# Patient Record
Sex: Female | Born: 1975 | Race: White | Hispanic: No | Smoking: Current every day smoker
Health system: Southern US, Community
[De-identification: ages and names within clinical notes are randomized; demographics above are authoritative.]

## PROBLEM LIST (undated history)

## (undated) ENCOUNTER — Inpatient Hospital Stay (HOSPITAL_COMMUNITY): Payer: Medicaid Other

## (undated) ENCOUNTER — Inpatient Hospital Stay (HOSPITAL_COMMUNITY): Payer: Self-pay

## (undated) DIAGNOSIS — D649 Anemia, unspecified: Secondary | ICD-10-CM

## (undated) DIAGNOSIS — R011 Cardiac murmur, unspecified: Secondary | ICD-10-CM

## (undated) DIAGNOSIS — IMO0002 Reserved for concepts with insufficient information to code with codable children: Secondary | ICD-10-CM

## (undated) DIAGNOSIS — E559 Vitamin D deficiency, unspecified: Secondary | ICD-10-CM

## (undated) DIAGNOSIS — O139 Gestational [pregnancy-induced] hypertension without significant proteinuria, unspecified trimester: Secondary | ICD-10-CM

## (undated) DIAGNOSIS — A749 Chlamydial infection, unspecified: Secondary | ICD-10-CM

## (undated) DIAGNOSIS — F329 Major depressive disorder, single episode, unspecified: Secondary | ICD-10-CM

## (undated) DIAGNOSIS — R87629 Unspecified abnormal cytological findings in specimens from vagina: Secondary | ICD-10-CM

## (undated) DIAGNOSIS — C801 Malignant (primary) neoplasm, unspecified: Secondary | ICD-10-CM

## (undated) DIAGNOSIS — T07XXXA Unspecified multiple injuries, initial encounter: Secondary | ICD-10-CM

## (undated) DIAGNOSIS — R87619 Unspecified abnormal cytological findings in specimens from cervix uteri: Secondary | ICD-10-CM

## (undated) DIAGNOSIS — E669 Obesity, unspecified: Secondary | ICD-10-CM

## (undated) DIAGNOSIS — R51 Headache: Secondary | ICD-10-CM

## (undated) DIAGNOSIS — O09899 Supervision of other high risk pregnancies, unspecified trimester: Secondary | ICD-10-CM

## (undated) DIAGNOSIS — F419 Anxiety disorder, unspecified: Secondary | ICD-10-CM

## (undated) DIAGNOSIS — B977 Papillomavirus as the cause of diseases classified elsewhere: Secondary | ICD-10-CM

## (undated) DIAGNOSIS — O24419 Gestational diabetes mellitus in pregnancy, unspecified control: Secondary | ICD-10-CM

## (undated) DIAGNOSIS — R87810 Cervical high risk human papillomavirus (HPV) DNA test positive: Secondary | ICD-10-CM

## (undated) DIAGNOSIS — Z8619 Personal history of other infectious and parasitic diseases: Secondary | ICD-10-CM

## (undated) DIAGNOSIS — B379 Candidiasis, unspecified: Secondary | ICD-10-CM

## (undated) DIAGNOSIS — F32A Depression, unspecified: Secondary | ICD-10-CM

## (undated) HISTORY — DX: Anemia, unspecified: D64.9

## (undated) HISTORY — DX: Cardiac murmur, unspecified: R01.1

## (undated) HISTORY — DX: Cervical high risk human papillomavirus (HPV) DNA test positive: R87.810

## (undated) HISTORY — DX: Headache: R51

## (undated) HISTORY — PX: LEEP: SHX91

## (undated) HISTORY — DX: Depression, unspecified: F32.A

## (undated) HISTORY — DX: Papillomavirus as the cause of diseases classified elsewhere: B97.7

## (undated) HISTORY — DX: Personal history of other infectious and parasitic diseases: Z86.19

## (undated) HISTORY — DX: Anxiety disorder, unspecified: F41.9

## (undated) HISTORY — DX: Candidiasis, unspecified: B37.9

## (undated) HISTORY — DX: Reserved for concepts with insufficient information to code with codable children: IMO0002

## (undated) HISTORY — PX: COLPOSCOPY: SHX161

## (undated) HISTORY — DX: Gestational diabetes mellitus in pregnancy, unspecified control: O24.419

## (undated) HISTORY — DX: Major depressive disorder, single episode, unspecified: F32.9

## (undated) HISTORY — DX: Malignant (primary) neoplasm, unspecified: C80.1

## (undated) HISTORY — DX: Unspecified multiple injuries, initial encounter: T07.XXXA

## (undated) HISTORY — PX: WISDOM TOOTH EXTRACTION: SHX21

## (undated) HISTORY — DX: Gestational (pregnancy-induced) hypertension without significant proteinuria, unspecified trimester: O13.9

## (undated) HISTORY — DX: Unspecified abnormal cytological findings in specimens from cervix uteri: R87.619

## (undated) HISTORY — DX: Unspecified abnormal cytological findings in specimens from vagina: R87.629

## (undated) HISTORY — DX: Chlamydial infection, unspecified: A74.9

## (undated) HISTORY — DX: Supervision of other high risk pregnancies, unspecified trimester: O09.899

---
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
## 2004-04-30 DIAGNOSIS — C801 Malignant (primary) neoplasm, unspecified: Secondary | ICD-10-CM

## 2004-04-30 HISTORY — DX: Malignant (primary) neoplasm, unspecified: C80.1

## 2005-08-13 ENCOUNTER — Emergency Department (HOSPITAL_COMMUNITY): Admission: EM | Admit: 2005-08-13 | Discharge: 2005-08-13 | Payer: Self-pay | Admitting: Emergency Medicine

## 2006-04-09 ENCOUNTER — Ambulatory Visit: Payer: Self-pay | Admitting: Ophthalmology

## 2006-09-16 ENCOUNTER — Ambulatory Visit: Payer: Self-pay | Admitting: Unknown Physician Specialty

## 2006-10-03 ENCOUNTER — Ambulatory Visit: Payer: Self-pay | Admitting: Unknown Physician Specialty

## 2008-08-01 ENCOUNTER — Inpatient Hospital Stay (HOSPITAL_COMMUNITY): Admission: AD | Admit: 2008-08-01 | Discharge: 2008-08-01 | Payer: Self-pay | Admitting: Obstetrics & Gynecology

## 2008-08-08 ENCOUNTER — Inpatient Hospital Stay (HOSPITAL_COMMUNITY): Admission: AD | Admit: 2008-08-08 | Discharge: 2008-08-10 | Payer: Self-pay | Admitting: Obstetrics and Gynecology

## 2010-07-22 ENCOUNTER — Inpatient Hospital Stay (HOSPITAL_COMMUNITY)
Admission: AD | Admit: 2010-07-22 | Discharge: 2010-07-24 | DRG: 775 | Disposition: A | Discharge status: Home / Self Care | Payer: Medicaid Other | Source: Ambulatory Visit | Attending: Obstetrics and Gynecology | Admitting: Obstetrics and Gynecology

## 2010-07-22 LAB — CBC
HCT: 36.3 % (ref 36.0–46.0)
Hemoglobin: 12.4 g/dL (ref 12.0–15.0)
MCH: 30.3 pg (ref 26.0–34.0)
MCHC: 34.2 g/dL (ref 30.0–36.0)
MCV: 88.8 fL (ref 78.0–100.0)
Platelets: 140 10*3/uL — ABNORMAL LOW (ref 150–400)
RBC: 4.09 MIL/uL (ref 3.87–5.11)
RDW: 13.6 % (ref 11.5–15.5)
WBC: 10.5 10*3/uL (ref 4.0–10.5)

## 2010-07-22 LAB — RPR: RPR Ser Ql: NONREACTIVE

## 2010-07-23 LAB — CBC
HCT: 31.7 % — ABNORMAL LOW (ref 36.0–46.0)
Hemoglobin: 10.5 g/dL — ABNORMAL LOW (ref 12.0–15.0)
MCH: 29.7 pg (ref 26.0–34.0)
MCHC: 33.1 g/dL (ref 30.0–36.0)
MCV: 89.5 fL (ref 78.0–100.0)
Platelets: 142 10*3/uL — ABNORMAL LOW (ref 150–400)
RBC: 3.54 MIL/uL — ABNORMAL LOW (ref 3.87–5.11)
RDW: 13.7 % (ref 11.5–15.5)
WBC: 9.7 10*3/uL (ref 4.0–10.5)

## 2010-07-28 ENCOUNTER — Inpatient Hospital Stay (HOSPITAL_COMMUNITY): Admission: AD | Admit: 2010-07-28 | Payer: Self-pay | Admitting: Obstetrics and Gynecology

## 2010-08-09 LAB — CBC
HCT: 32.1 % — ABNORMAL LOW (ref 36.0–46.0)
HCT: 34.9 % — ABNORMAL LOW (ref 36.0–46.0)
Hemoglobin: 11.2 g/dL — ABNORMAL LOW (ref 12.0–15.0)
Hemoglobin: 11.5 g/dL — ABNORMAL LOW (ref 12.0–15.0)
MCHC: 33.1 g/dL (ref 30.0–36.0)
MCHC: 34.9 g/dL (ref 30.0–36.0)
MCV: 92.8 fL (ref 78.0–100.0)
MCV: 92.8 fL (ref 78.0–100.0)
Platelets: 139 10*3/uL — ABNORMAL LOW (ref 150–400)
Platelets: 172 10*3/uL (ref 150–400)
RBC: 3.45 MIL/uL — ABNORMAL LOW (ref 3.87–5.11)
RBC: 3.76 MIL/uL — ABNORMAL LOW (ref 3.87–5.11)
RDW: 13.6 % (ref 11.5–15.5)
RDW: 14.1 % (ref 11.5–15.5)
WBC: 10.3 10*3/uL (ref 4.0–10.5)
WBC: 9.9 10*3/uL (ref 4.0–10.5)

## 2010-08-09 LAB — RPR: RPR Ser Ql: NONREACTIVE
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
## 2010-09-12 NOTE — H&P (Signed)
NAME:  Michelle Page, Michelle Page  ACCOUNT NO.:  [id number]   MEDICAL RECORD NO.:  [id number]          PATIENT TYPE:  INP   LOCATION:  9169                          [facility]ACILITY:  [facility]   PHYSICIAN:  Eulogio Bear, CNM    DATE OF BIRTH:  [birth year]   DATE OF ADMISSION:  08/08/2008  DATE OF DISCHARGE:                              HISTORY & PHYSICAL   ADDENDUM TO HISTORY AND PHYSICAL:   GENETIC HISTORY:  All genetic screens on this pregnancy were normal  including first trimester screen in the AFP.  Father of the baby has no  contributory genetic information.  Mother of the baby has a history of  heart murmur as a child and no other genetic information that is  significant and no other genetic information in family history.      Eulogio Bear, CNM     JM/MEDQ  D:  08/09/2008  T:  08/09/2008  Job:  [id number]

## 2011-05-01 NOTE — L&D Delivery Note (Signed)
Delivery Note   Just prior to my arrival to room, pt had SROM w clear fluid, now pt on hands and knees on the bed, w vtx +2, pt involuntarily pushed over next ctx   At 4:55 PM a viable female was delivered via  (Presentation: Left Occiput Anterior). Shoulders delivered easily, infant w lusty cry and placed on mom's abdomen, APGAR: 9, 9; weight - pending Placenta status: Intact, Spontaneous.  Cord: 2 vessels with the following complications: None.  Cord pH: n/a  Anesthesia: local  Episiotomy: None Lacerations: 1st degree Suture Repair: 3.0 vicryl rapide Est. Blood Loss (mL): 300  Mom to postpartum.  Baby to nursery-stable. Infant remains skin-skin in delivery room Infant breastfeeding as I was leaving  Vivion Romano M 04/08/2012, 5:35 PM

## 2011-08-09 ENCOUNTER — Other Ambulatory Visit (INDEPENDENT_AMBULATORY_CARE_PROVIDER_SITE_OTHER): Payer: Self-pay

## 2011-08-09 DIAGNOSIS — N912 Amenorrhea, unspecified: Secondary | ICD-10-CM

## 2011-08-09 LAB — POCT URINE PREGNANCY: Preg Test, Ur: POSITIVE

## 2011-09-12 ENCOUNTER — Ambulatory Visit (INDEPENDENT_AMBULATORY_CARE_PROVIDER_SITE_OTHER): Payer: Medicaid Other | Admitting: Obstetrics and Gynecology

## 2011-09-12 ENCOUNTER — Encounter: Payer: Self-pay | Admitting: Obstetrics and Gynecology

## 2011-09-12 ENCOUNTER — Ambulatory Visit (INDEPENDENT_AMBULATORY_CARE_PROVIDER_SITE_OTHER): Payer: Medicaid Other

## 2011-09-12 VITALS — BP 90/58 | Wt 167.0 lb

## 2011-09-12 DIAGNOSIS — Z9889 Other specified postprocedural states: Secondary | ICD-10-CM

## 2011-09-12 DIAGNOSIS — D649 Anemia, unspecified: Secondary | ICD-10-CM

## 2011-09-12 DIAGNOSIS — F302 Manic episode, severe with psychotic symptoms: Secondary | ICD-10-CM | POA: Insufficient documentation

## 2011-09-12 DIAGNOSIS — O09893 Supervision of other high risk pregnancies, third trimester: Secondary | ICD-10-CM

## 2011-09-12 DIAGNOSIS — F329 Major depressive disorder, single episode, unspecified: Secondary | ICD-10-CM

## 2011-09-12 DIAGNOSIS — O3440 Maternal care for other abnormalities of cervix, unspecified trimester: Secondary | ICD-10-CM

## 2011-09-12 DIAGNOSIS — O26849 Uterine size-date discrepancy, unspecified trimester: Secondary | ICD-10-CM

## 2011-09-12 DIAGNOSIS — Z331 Pregnant state, incidental: Secondary | ICD-10-CM

## 2011-09-12 DIAGNOSIS — F32A Depression, unspecified: Secondary | ICD-10-CM | POA: Insufficient documentation

## 2011-09-12 DIAGNOSIS — F3289 Other specified depressive episodes: Secondary | ICD-10-CM

## 2011-09-12 DIAGNOSIS — Z3490 Encounter for supervision of normal pregnancy, unspecified, unspecified trimester: Secondary | ICD-10-CM

## 2011-09-12 LAB — US OB TRANSVAGINAL

## 2011-09-12 NOTE — Progress Notes (Addendum)
  Subjective:    Michelle Page is being seen today for her first obstetrical visit.  This is a planned pregnancy. She is at [redacted]w[redacted]d gestation.  She is currently breast feeding 36 yo. She may be interested in waterbirth--referred to waterbirth class.  Her obstetrical history is significant for:  Hx LEEP Hx depression Hx PIH Hx migraines  Relationship with FOB: spouse, living together. Patient does intend to breast feed. Pregnancy history fully reviewed.  Patient reports no complaints.  Review of Systems:   Review of Systems  All other systems reviewed and are negative.    Objective:     BP 90/58  Wt 167 lb (75.751 kg)  LMP 07/06/2011 Physical Exam  Exam  Chest clear Heart RRR with murmur Abd soft, NT Fundal height 9-10 weeks, retroverted uterus, unable to hear FHR (verified by [place]) Cervix closed, long, no discharge in vault.  Wet prep WNL. Ext WNL     Assessment:    Pregnancy: [id number] at [redacted]w[redacted]d  Patient Active Problem List  Diagnoses  . Hx LEEP (loop electrosurgical excision procedure), cervix, pregnancy  . Depression  . Prior pregnancy complicated by PIH, antepartum, third trimester  . Anemia       Plan:    [place] for viability = [redacted]w[redacted]d, EDC 04/11/12, c/w dates, SIUP. Initial labs drawn. Pap, cultures, wet prep done. Prenatal vitamins discussed. Problem list reviewed and updated. Genetic screening discussedordered--1st trimester screen scheduled for 3 weeks with ROB.  If patient elects to decline 1st trimester screen, will keep ROB visit. Role of ultrasound in pregnancy discussed; fetal survey: planned at 18 weeks for anatomy.. Eye drops for allergies OK.  May use OTC fish oil. Amniocentesis discussed: not indicated. Follow up in 3 weeks. 80% of 30 min visit spent on counseling and coordination of care.     Nigel Bridgeman 09/12/2011

## 2011-09-12 NOTE — Progress Notes (Signed)
Pt wants to know if Ketotien Fumarale eye drops for allergies are safe in pregnancy as well as fish oil 1000 mg  Wants genetic screening

## 2011-09-13 LAB — PRENATAL PANEL VII
Antibody Screen: NEGATIVE
Basophils Absolute: 0 10*3/uL (ref 0.0–0.1)
Basophils Relative: 0 % (ref 0–1)
Eosinophils Absolute: 0.1 10*3/uL (ref 0.0–0.7)
Eosinophils Relative: 2 % (ref 0–5)
HCT: 34.1 % — ABNORMAL LOW (ref 36.0–46.0)
HIV: NONREACTIVE
Hemoglobin: 11.5 g/dL — ABNORMAL LOW (ref 12.0–15.0)
Hepatitis B Surface Ag: NEGATIVE
Lymphocytes Relative: 34 % (ref 12–46)
Lymphs Abs: 2.1 10*3/uL (ref 0.7–4.0)
MCH: 29.6 pg (ref 26.0–34.0)
MCHC: 33.7 g/dL (ref 30.0–36.0)
MCV: 87.7 fL (ref 78.0–100.0)
Monocytes Absolute: 0.4 10*3/uL (ref 0.1–1.0)
Monocytes Relative: 7 % (ref 3–12)
Neutro Abs: 3.6 10*3/uL (ref 1.7–7.7)
Neutrophils Relative %: 58 % (ref 43–77)
Platelets: 184 10*3/uL (ref 150–400)
RBC: 3.89 MIL/uL (ref 3.87–5.11)
RDW: 14.1 % (ref 11.5–15.5)
Rh Type: POSITIVE
Rubella: 87.4 IU/mL — ABNORMAL HIGH
WBC: 6.2 10*3/uL (ref 4.0–10.5)

## 2011-09-13 NOTE — Progress Notes (Signed)
Addended by: Janeece Agee on: 09/13/2011 09:09 AM   Modules accepted: Orders

## 2011-09-18 LAB — PAP IG W/ RFLX HPV ASCU

## 2011-09-25 ENCOUNTER — Telehealth: Payer: Self-pay | Admitting: Obstetrics and Gynecology

## 2011-09-25 NOTE — Telephone Encounter (Signed)
Triage/epic 

## 2011-09-25 NOTE — Telephone Encounter (Signed)
TC to pt.   States has mucousy, green vag D/C sometimes with wiping x 1 wk.   No itching;  No Burning.  Unsure if any odor.  Per SL pt may wait until next appt  10/02/11.  If other sx increase or has any bleeding to call for sooner appt. Pt verbalizes comprehension.

## 2011-09-27 ENCOUNTER — Telehealth: Payer: Self-pay | Admitting: Obstetrics and Gynecology

## 2011-09-27 NOTE — Telephone Encounter (Signed)
Triage/epic 

## 2011-09-27 NOTE — Telephone Encounter (Signed)
PT CALLED, IS 12 WKS, HAS NOTICED A GREEN D/C X 1 WK, UNSURE IF THERE IS AN ODOR B/C NOSE HAS BEEN STUFFY, PT SCHED FOR EVAL TOMORROW 09/28/11 @ 0930, PT VOICES AGREEMENT.

## 2011-09-28 ENCOUNTER — Encounter: Payer: Self-pay | Admitting: Obstetrics and Gynecology

## 2011-09-28 ENCOUNTER — Ambulatory Visit (INDEPENDENT_AMBULATORY_CARE_PROVIDER_SITE_OTHER): Payer: Medicaid Other | Admitting: Obstetrics and Gynecology

## 2011-09-28 VITALS — BP 98/60 | Temp 98.6°F | Ht 64.0 in | Wt 165.0 lb

## 2011-09-28 DIAGNOSIS — Z8619 Personal history of other infectious and parasitic diseases: Secondary | ICD-10-CM

## 2011-09-28 DIAGNOSIS — R011 Cardiac murmur, unspecified: Secondary | ICD-10-CM

## 2011-09-28 DIAGNOSIS — O139 Gestational [pregnancy-induced] hypertension without significant proteinuria, unspecified trimester: Secondary | ICD-10-CM

## 2011-09-28 DIAGNOSIS — C801 Malignant (primary) neoplasm, unspecified: Secondary | ICD-10-CM

## 2011-09-28 DIAGNOSIS — A749 Chlamydial infection, unspecified: Secondary | ICD-10-CM

## 2011-09-28 DIAGNOSIS — R6889 Other general symptoms and signs: Secondary | ICD-10-CM

## 2011-09-28 DIAGNOSIS — Z113 Encounter for screening for infections with a predominantly sexual mode of transmission: Secondary | ICD-10-CM

## 2011-09-28 DIAGNOSIS — B49 Unspecified mycosis: Secondary | ICD-10-CM

## 2011-09-28 DIAGNOSIS — N898 Other specified noninflammatory disorders of vagina: Secondary | ICD-10-CM

## 2011-09-28 DIAGNOSIS — B977 Papillomavirus as the cause of diseases classified elsewhere: Secondary | ICD-10-CM

## 2011-09-28 DIAGNOSIS — IMO0002 Reserved for concepts with insufficient information to code with codable children: Secondary | ICD-10-CM

## 2011-09-28 DIAGNOSIS — B379 Candidiasis, unspecified: Secondary | ICD-10-CM

## 2011-09-28 LAB — POCT OSOM TRICHOMONAS RAPID TEST: Trichomonas vaginalis: NEGATIVE

## 2011-09-28 LAB — POCT WET PREP (WET MOUNT)
Whiff Test: NEGATIVE
pH: 4.5

## 2011-09-28 LAB — HEPATITIS C ANTIBODY: HCV Ab: NEGATIVE

## 2011-09-28 MED ORDER — TERCONAZOLE 0.4 % VA CREA: 1.0000 | TOPICAL_CREAM | Freq: Every day | VAGINAL | Status: AC

## 2011-09-28 NOTE — Progress Notes (Signed)
36 YO who is approximately 11 weeks 6 days pregnant complaining of heavy vaginal discharge x 1 week that is itchy and green. Denies vaginal bleeding, pelvic cramping, urinary tract symptoms or constipation.  Patient also requests STD testing.   O: Abdomen: soft, non-tender; FHT 144 bpm      Pelvic: EGBUS-wnl;  vagina-deep yellow discharge;      cervix-no lesions/no tenderness/long & closed; Uterus-      10-12 weeks size; adnexae-no tenderness   Wet Prep: pH 4.5; whiff-negative;  yeast + OSOM-negative  A: Yeast Vaginitis     Pregnant [redacted] weeks 6 days/dates  P: STD testing at patient's request       Terazol 7 Vaginal Cream  # 45 gram tube 1 appl.      pv qhs x 7 days       RTO-as scheduled   Vannah Nadal, PA-C

## 2011-09-28 NOTE — Progress Notes (Signed)
Color: green Odor: no Itching:no Thin:no Thick:yes Fever:no Dyspareunia:no Hx PID:no HX STD:no Pelvic Pain:no Desires Gc/CT:yes Desires HIV,RPR,HbsAG:no

## 2011-09-28 NOTE — Patient Instructions (Signed)
Avoid: - excess soap on genital area (consider using plain oatmeal soap) - use of powder or sprays in genital area - douching - wearing underwear to bed (except with menses) - using more than is directed detergent when washing clothes - tight fitting garments around genital area - excess sugar intake   

## 2011-09-29 LAB — GC/CHLAMYDIA PROBE AMP, GENITAL
Chlamydia, DNA Probe: NEGATIVE
GC Probe Amp, Genital: NEGATIVE

## 2011-10-01 LAB — HSV 1 ANTIBODY, IGG: HSV 1 Glycoprotein G Ab, IgG: 2.65 IV — ABNORMAL HIGH

## 2011-10-01 LAB — HSV 2 ANTIBODY, IGG: HSV 2 Glycoprotein G Ab, IgG: <0.1 IV

## 2011-10-02 ENCOUNTER — Other Ambulatory Visit: Payer: Medicaid Other

## 2011-10-02 ENCOUNTER — Encounter: Payer: Medicaid Other | Admitting: Obstetrics and Gynecology

## 2011-10-03 ENCOUNTER — Ambulatory Visit (INDEPENDENT_AMBULATORY_CARE_PROVIDER_SITE_OTHER): Payer: Medicaid Other

## 2011-10-03 ENCOUNTER — Encounter: Payer: Medicaid Other | Admitting: Obstetrics and Gynecology

## 2011-10-03 ENCOUNTER — Other Ambulatory Visit: Payer: Medicaid Other

## 2011-10-03 ENCOUNTER — Other Ambulatory Visit: Payer: Self-pay | Admitting: Obstetrics and Gynecology

## 2011-10-03 ENCOUNTER — Ambulatory Visit (INDEPENDENT_AMBULATORY_CARE_PROVIDER_SITE_OTHER): Payer: Medicaid Other | Admitting: Obstetrics and Gynecology

## 2011-10-03 VITALS — BP 102/58 | Wt 166.0 lb

## 2011-10-03 DIAGNOSIS — Z3490 Encounter for supervision of normal pregnancy, unspecified, unspecified trimester: Secondary | ICD-10-CM

## 2011-10-03 DIAGNOSIS — Z331 Pregnant state, incidental: Secondary | ICD-10-CM

## 2011-10-03 DIAGNOSIS — Z36 Encounter for antenatal screening of mother: Secondary | ICD-10-CM

## 2011-10-03 NOTE — Progress Notes (Signed)
Sono: 1st trimester screen   CRL: 6.98 cm  Variable presentation. Posterior placenta  Amnion seen  Nt = 2.1

## 2011-10-03 NOTE — Progress Notes (Signed)
No complaints. Energy improving. Prenatal labs reviewed with patient. Questions answered on exercise in pregnancy

## 2011-10-03 NOTE — Progress Notes (Signed)
Pt. Stated no issues today.  

## 2011-10-04 LAB — US OB COMP LESS 14 WKS

## 2011-10-12 ENCOUNTER — Telehealth: Payer: Self-pay | Admitting: Obstetrics and Gynecology

## 2011-10-12 ENCOUNTER — Other Ambulatory Visit: Payer: Self-pay | Admitting: Obstetrics and Gynecology

## 2011-10-12 MED ORDER — TERCONAZOLE 0.8 % VA CREA: 1.0000 | TOPICAL_CREAM | Freq: Every day | VAGINAL | Status: AC

## 2011-10-12 MED ORDER — NYSTATIN-TRIAMCINOLONE 100000-0.1 UNIT/GM-% EX CREA: TOPICAL_CREAM | Freq: Three times a day (TID) | CUTANEOUS | Status: DC

## 2011-10-12 NOTE — Telephone Encounter (Signed)
TC to pt.  States went to gym this AM and is having recurrence of same greenish vaginal D/C  As 2 weeks ago.   Was treated with Terazol. Will consult with provider.    Also requests dental letter.

## 2011-10-12 NOTE — Telephone Encounter (Signed)
Triage/chart received °

## 2011-10-12 NOTE — Telephone Encounter (Signed)
TC to pt.  Per SL, RX forTerazol 3 and Mycolog II.  Advised if D/C resolved after one use of TERazol may D/C.  If continues after completion of treatment, needs eval.  To apply Mycolog externally.  Reduce sugar. Call if sx increase.Pt verbalizes comprehension.   Dental letter mailed to pt.

## 2011-11-02 ENCOUNTER — Ambulatory Visit (INDEPENDENT_AMBULATORY_CARE_PROVIDER_SITE_OTHER): Payer: Medicaid Other

## 2011-11-02 VITALS — BP 108/64 | Wt 169.0 lb

## 2011-11-02 DIAGNOSIS — R21 Rash and other nonspecific skin eruption: Secondary | ICD-10-CM

## 2011-11-02 DIAGNOSIS — Z331 Pregnant state, incidental: Secondary | ICD-10-CM

## 2011-11-02 DIAGNOSIS — Z3490 Encounter for supervision of normal pregnancy, unspecified, unspecified trimester: Secondary | ICD-10-CM

## 2011-11-02 MED ORDER — NYSTATIN-TRIAMCINOLONE 100000-0.1 UNIT/GM-% EX CREA: TOPICAL_CREAM | Freq: Three times a day (TID) | CUTANEOUS | Status: DC

## 2011-11-02 NOTE — Progress Notes (Signed)
Pt c/o rash on the side of neck. Also c/o redness and irritation at anal area.

## 2011-11-04 NOTE — Progress Notes (Signed)
17w.  C/o recurring rash/small bumps along Lt part of neck behind ear.  Has recently changed shampoo and detergent.  Appears to be contact dermatitis; rec'd hydrocortisone cream.  Pt worried may be HSV, but papular areas not vesicular.  Also c/o of irritation in perianal area.  Semi-circular line of redness along hair-line about 1-2 in from anus.  To use mycolog prn; could also be skin sensitivity.  Pt reports both areas more inflamed when she is hot herself.  Anatomy u/s & AFP in 3-4 weeks.  Pt is going to church camp middle of the month.  Rev'd nml 1st trimester screen.

## 2011-11-21 ENCOUNTER — Ambulatory Visit (INDEPENDENT_AMBULATORY_CARE_PROVIDER_SITE_OTHER): Payer: Medicaid Other | Admitting: Obstetrics and Gynecology

## 2011-11-21 ENCOUNTER — Ambulatory Visit (INDEPENDENT_AMBULATORY_CARE_PROVIDER_SITE_OTHER): Payer: Medicaid Other

## 2011-11-21 VITALS — BP 100/66 | Wt 171.0 lb

## 2011-11-21 DIAGNOSIS — Z3689 Encounter for other specified antenatal screening: Secondary | ICD-10-CM

## 2011-11-21 DIAGNOSIS — Z331 Pregnant state, incidental: Secondary | ICD-10-CM

## 2011-11-21 DIAGNOSIS — Z3490 Encounter for supervision of normal pregnancy, unspecified, unspecified trimester: Secondary | ICD-10-CM

## 2011-11-21 LAB — US OB COMP + 14 WK

## 2011-11-21 NOTE — Progress Notes (Signed)
[redacted]w[redacted]d  Patient has returned for USS for Growth and anatomy. Presentation Vx , Placenta posterior, Normal fluid aP pocket 4.2 cm Measurement c/w GA and EDD. Noted 2 Vessel Cord. The left Umbilical Artery is present. Baby in Prone position and difficult to see anatomy. Gender not reported as per request of the mother. To have f/u USS at 24 wks for growth due to 2 vessel cord. It may be necessary to f/u sooner.  Cx closed and adnexa - normal. ROB x 4 weeks

## 2011-11-22 ENCOUNTER — Encounter: Payer: Self-pay | Admitting: Obstetrics and Gynecology

## 2011-11-26 DIAGNOSIS — O09899 Supervision of other high risk pregnancies, unspecified trimester: Secondary | ICD-10-CM

## 2011-11-26 HISTORY — DX: Supervision of other high risk pregnancies, unspecified trimester: O09.899

## 2011-11-28 ENCOUNTER — Telehealth: Payer: Self-pay

## 2011-11-28 NOTE — Telephone Encounter (Signed)
LM pt needs AFP

## 2011-11-29 ENCOUNTER — Encounter: Payer: Medicaid Other | Admitting: Obstetrics and Gynecology

## 2011-11-29 ENCOUNTER — Other Ambulatory Visit: Payer: Medicaid Other

## 2011-12-20 ENCOUNTER — Other Ambulatory Visit (INDEPENDENT_AMBULATORY_CARE_PROVIDER_SITE_OTHER): Payer: Medicaid Other

## 2011-12-20 ENCOUNTER — Encounter: Payer: Self-pay | Admitting: Obstetrics and Gynecology

## 2011-12-20 ENCOUNTER — Ambulatory Visit (INDEPENDENT_AMBULATORY_CARE_PROVIDER_SITE_OTHER): Payer: Medicaid Other | Admitting: Obstetrics and Gynecology

## 2011-12-20 VITALS — BP 98/60 | Wt 172.0 lb

## 2011-12-20 DIAGNOSIS — O288 Other abnormal findings on antenatal screening of mother: Secondary | ICD-10-CM

## 2011-12-20 DIAGNOSIS — IMO0001 Reserved for inherently not codable concepts without codable children: Secondary | ICD-10-CM

## 2011-12-20 DIAGNOSIS — Q270 Congenital absence and hypoplasia of umbilical artery: Secondary | ICD-10-CM

## 2011-12-20 DIAGNOSIS — O4190X0 Disorder of amniotic fluid and membranes, unspecified, unspecified trimester, not applicable or unspecified: Secondary | ICD-10-CM

## 2011-12-20 DIAGNOSIS — O26849 Uterine size-date discrepancy, unspecified trimester: Secondary | ICD-10-CM

## 2011-12-20 LAB — US OB FOLLOW UP

## 2011-12-20 NOTE — Progress Notes (Signed)
No concerns. 

## 2011-12-20 NOTE — Progress Notes (Signed)
Doing well.   [place] today due to 2vc--issues reviewed, discussed plan for [place] q 4 weeks for growth and fluid [place] today:  24 3/7 weeks, [facility] 04/07/12 c/w dates.  Placenta posterior.  Normal fluid and growth. EFW 1+10, 71%ile.  Cervix closed.  Female. Glucola, Hgb, RPR, and [place] for growth and fluid due to 2vc.

## 2011-12-21 NOTE — Telephone Encounter (Signed)
PT DECLINES AFP

## 2012-01-18 ENCOUNTER — Ambulatory Visit (INDEPENDENT_AMBULATORY_CARE_PROVIDER_SITE_OTHER): Payer: Medicaid Other

## 2012-01-18 ENCOUNTER — Encounter: Payer: Self-pay | Admitting: Obstetrics and Gynecology

## 2012-01-18 ENCOUNTER — Other Ambulatory Visit: Payer: Medicaid Other

## 2012-01-18 ENCOUNTER — Ambulatory Visit (INDEPENDENT_AMBULATORY_CARE_PROVIDER_SITE_OTHER): Payer: Medicaid Other | Admitting: Obstetrics and Gynecology

## 2012-01-18 VITALS — BP 90/56 | Wt 182.0 lb

## 2012-01-18 DIAGNOSIS — IMO0001 Reserved for inherently not codable concepts without codable children: Secondary | ICD-10-CM

## 2012-01-18 DIAGNOSIS — O4190X0 Disorder of amniotic fluid and membranes, unspecified, unspecified trimester, not applicable or unspecified: Secondary | ICD-10-CM

## 2012-01-18 DIAGNOSIS — Z331 Pregnant state, incidental: Secondary | ICD-10-CM

## 2012-01-18 DIAGNOSIS — O09899 Supervision of other high risk pregnancies, unspecified trimester: Secondary | ICD-10-CM

## 2012-01-18 DIAGNOSIS — O288 Other abnormal findings on antenatal screening of mother: Secondary | ICD-10-CM

## 2012-01-18 DIAGNOSIS — Q270 Congenital absence and hypoplasia of umbilical artery: Secondary | ICD-10-CM

## 2012-01-18 DIAGNOSIS — O26849 Uterine size-date discrepancy, unspecified trimester: Secondary | ICD-10-CM

## 2012-01-18 DIAGNOSIS — Z3490 Encounter for supervision of normal pregnancy, unspecified, unspecified trimester: Secondary | ICD-10-CM

## 2012-01-18 LAB — HEMOGLOBIN: Hemoglobin: 10.7 g/dL — ABNORMAL LOW (ref 12.0–15.0)

## 2012-01-18 LAB — US OB FOLLOW UP

## 2012-01-18 NOTE — Progress Notes (Signed)
Doing well. [place] today due to [facility]: Vtx, 28 5/7 weeks, EDC 04/06/12, c/w dates. EFW 2=13, 67%ile. AFI 15, 50%ile.  Cervix 3.35. Glucola, Hgb, RPR today. A+ type. Repeat US for growth q 4 weeks due to 2vc.

## 2012-01-18 NOTE — Progress Notes (Signed)
[redacted]w[redacted]d Growth 2vc u/s completed today 1 gtt completed w/o difficulty

## 2012-01-19 LAB — RPR

## 2012-01-19 LAB — GLUCOSE TOLERANCE, 1 HOUR (50G) W/O FASTING: Glucose, 1 Hour GTT: 155 mg/dL — ABNORMAL HIGH (ref 70–140)

## 2012-01-23 ENCOUNTER — Other Ambulatory Visit: Payer: Self-pay | Admitting: Obstetrics and Gynecology

## 2012-01-23 ENCOUNTER — Telehealth: Payer: Self-pay | Admitting: Obstetrics and Gynecology

## 2012-01-23 DIAGNOSIS — O99810 Abnormal glucose complicating pregnancy: Secondary | ICD-10-CM

## 2012-01-23 NOTE — Telephone Encounter (Signed)
Notified pt of elevated 1 hr glucola.  Sch pt for 3 hr GTT on 01-30-2012.  Mailed pt diet and appt info.

## 2012-01-23 NOTE — Telephone Encounter (Signed)
LM for pt to CB pt needs 3 hr GTT sch.

## 2012-01-31 LAB — GLUCOSE TOLERANCE, 3 HOURS
Glucose Tolerance, 1 hour: 126 mg/dL (ref 70–189)
Glucose Tolerance, 2 hour: 137 mg/dL (ref 70–164)
Glucose Tolerance, Fasting: 71 mg/dL (ref 70–104)
Glucose, GTT - 3 Hour: 132 mg/dL (ref 70–144)

## 2012-02-01 ENCOUNTER — Telehealth: Payer: Self-pay | Admitting: Obstetrics and Gynecology

## 2012-02-01 ENCOUNTER — Ambulatory Visit (INDEPENDENT_AMBULATORY_CARE_PROVIDER_SITE_OTHER): Payer: Medicaid Other | Admitting: Obstetrics and Gynecology

## 2012-02-01 VITALS — BP 98/60 | Wt 182.0 lb

## 2012-02-01 DIAGNOSIS — Z331 Pregnant state, incidental: Secondary | ICD-10-CM

## 2012-02-01 DIAGNOSIS — Q270 Congenital absence and hypoplasia of umbilical artery: Secondary | ICD-10-CM

## 2012-02-01 DIAGNOSIS — Z23 Encounter for immunization: Secondary | ICD-10-CM

## 2012-02-01 NOTE — Patient Instructions (Signed)
Fetal Movement Counts Patient Name: __________________________________________________ Patient Due Date: ____________________ Kick counts is highly recommended in high risk pregnancies, but it is a good idea for every pregnant woman to do. Start counting fetal movements at 28 weeks of the pregnancy. Fetal movements increase after eating a full meal or eating or drinking something sweet (the blood sugar is higher). It is also important to drink plenty of fluids (well hydrated) before doing the count. Lie on your left side because it helps with the circulation or you can sit in a comfortable chair with your arms over your belly (abdomen) with no distractions around you. DOING THE COUNT  Try to do the count the same time of day each time you do it.  Mark the day and time, then see how long it takes for you to feel 10 movements (kicks, flutters, swishes, rolls). You should have at least 10 movements within 2 hours. You will most likely feel 10 movements in much less than 2 hours. If you do not, wait an hour and count again. After a couple of days you will see a pattern.  What you are looking for is a change in the pattern or not enough counts in 2 hours. Is it taking longer in time to reach 10 movements? SEEK MEDICAL CARE IF:  You feel less than 10 counts in 2 hours. Tried twice.  No movement in one hour.  The pattern is changing or taking longer each day to reach 10 counts in 2 hours.  You feel the baby is not moving as it usually does. Date: ____________ Movements: ____________ Start time: ____________ Finish time: ____________  Date: ____________ Movements: ____________ Start time: ____________ Finish time: ____________ Date: ____________ Movements: ____________ Start time: ____________ Finish time: ____________ Date: ____________ Movements: ____________ Start time: ____________ Finish time: ____________ Date: ____________ Movements: ____________ Start time: ____________ Finish time:  ____________ Date: ____________ Movements: ____________ Start time: ____________ Finish time: ____________ Date: ____________ Movements: ____________ Start time: ____________ Finish time: ____________ Date: ____________ Movements: ____________ Start time: ____________ Finish time: ____________  Date: ____________ Movements: ____________ Start time: ____________ Finish time: ____________ Date: ____________ Movements: ____________ Start time: ____________ Finish time: ____________ Date: ____________ Movements: ____________ Start time: ____________ Finish time: ____________ Date: ____________ Movements: ____________ Start time: ____________ Finish time: ____________ Date: ____________ Movements: ____________ Start time: ____________ Finish time: ____________ Date: ____________ Movements: ____________ Start time: ____________ Finish time: ____________ Date: ____________ Movements: ____________ Start time: ____________ Finish time: ____________  Date: ____________ Movements: ____________ Start time: ____________ Finish time: ____________ Date: ____________ Movements: ____________ Start time: ____________ Finish time: ____________ Date: ____________ Movements: ____________ Start time: ____________ Finish time: ____________ Date: ____________ Movements: ____________ Start time: ____________ Finish time: ____________ Date: ____________ Movements: ____________ Start time: ____________ Finish time: ____________ Date: ____________ Movements: ____________ Start time: ____________ Finish time: ____________ Date: ____________ Movements: ____________ Start time: ____________ Finish time: ____________  Date: ____________ Movements: ____________ Start time: ____________ Finish time: ____________ Date: ____________ Movements: ____________ Start time: ____________ Finish time: ____________ Date: ____________ Movements: ____________ Start time: ____________ Finish time: ____________ Date: ____________ Movements:  ____________ Start time: ____________ Finish time: ____________ Date: ____________ Movements: ____________ Start time: ____________ Finish time: ____________ Date: ____________ Movements: ____________ Start time: ____________ Finish time: ____________ Date: ____________ Movements: ____________ Start time: ____________ Finish time: ____________  Date: ____________ Movements: ____________ Start time: ____________ Finish time: ____________ Date: ____________ Movements: ____________ Start time: ____________ Finish time: ____________ Date: ____________ Movements: ____________ Start time: ____________ Finish time: ____________ Date: ____________ Movements:   ____________ Start time: ____________ Finish time: ____________ Date: ____________ Movements: ____________ Start time: ____________ Finish time: ____________ Date: ____________ Movements: ____________ Start time: ____________ Finish time: ____________ Date: ____________ Movements: ____________ Start time: ____________ Finish time: ____________  Date: ____________ Movements: ____________ Start time: ____________ Finish time: ____________ Date: ____________ Movements: ____________ Start time: ____________ Finish time: ____________ Date: ____________ Movements: ____________ Start time: ____________ Finish time: ____________ Date: ____________ Movements: ____________ Start time: ____________ Finish time: ____________ Date: ____________ Movements: ____________ Start time: ____________ Finish time: ____________ Date: ____________ Movements: ____________ Start time: ____________ Finish time: ____________ Date: ____________ Movements: ____________ Start time: ____________ Finish time: ____________  Date: ____________ Movements: ____________ Start time: ____________ Finish time: ____________ Date: ____________ Movements: ____________ Start time: ____________ Finish time: ____________ Date: ____________ Movements: ____________ Start time: ____________ Finish  time: ____________ Date: ____________ Movements: ____________ Start time: ____________ Finish time: ____________ Date: ____________ Movements: ____________ Start time: ____________ Finish time: ____________ Date: ____________ Movements: ____________ Start time: ____________ Finish time: ____________ Date: ____________ Movements: ____________ Start time: ____________ Finish time: ____________  Date: ____________ Movements: ____________ Start time: ____________ Finish time: ____________ Date: ____________ Movements: ____________ Start time: ____________ Finish time: ____________ Date: ____________ Movements: ____________ Start time: ____________ Finish time: ____________ Date: ____________ Movements: ____________ Start time: ____________ Finish time: ____________ Date: ____________ Movements: ____________ Start time: ____________ Finish time: ____________ Date: ____________ Movements: ____________ Start time: ____________ Finish time: ____________ Document Released: 05/16/2006 Document Revised: 07/09/2011 Document Reviewed: 11/16/2008 [employer] Patient Information 2013 [employer].  Preventing Preterm Labor Preterm labor is when a pregnant woman has contractions that cause the cervix to open, shorten, and thin before 37 weeks of pregnancy. You will have regular contractions (tightening) 2 to 3 minutes apart. This usually causes discomfort or pain. HOME CARE  Eat a healthy diet.  Take your vitamins as told by your doctor.  Drink enough fluids to keep your pee (urine) clear or pale yellow every day.  Get rest and sleep.  Do not have sex if you are at high risk for preterm labor.  Follow your doctor's advice about activity, medicines, and tests.  Avoid stress.  Avoid hard labor or exercise that lasts for a long time.  Do not smoke. GET HELP RIGHT AWAY IF:   You are having contractions.  You have belly (abdominal) pain.  You have bleeding from your vagina.  You have pain when  you pee (urinate).  You have abnormal discharge from your vagina.  You have a temperature by mouth above 102 F (38.9 C). MAKE SURE YOU:  Understand these instructions.  Will watch your condition.  Will get help if you are not doing well or get worse. Document Released: 07/13/2008 Document Revised: 07/09/2011 Document Reviewed: 07/13/2008 [employer] Patient Information 2013 [employer].  

## 2012-02-01 NOTE — Progress Notes (Signed)
[redacted]w[redacted]d  No c/o  rv'd comfort measures and stretching, exercise Plans NCB, was considering WB, but cost prohibitive GFM [employer] [place] for 2VC rv'd PTL and FKC RTO 2wks

## 2012-02-01 NOTE — Telephone Encounter (Signed)
Notified pt normal 3 hg GTT.

## 2012-02-01 NOTE — Progress Notes (Signed)
[redacted]w[redacted]d  3hr GTT was normal.   Pt states she has discussed circ info already.  Pt requested flu vaccine. Flu consent signed.   Flu vaccine given in Left Deltoid w/o any difficulties.

## 2012-02-15 ENCOUNTER — Ambulatory Visit (INDEPENDENT_AMBULATORY_CARE_PROVIDER_SITE_OTHER): Payer: Medicaid Other | Admitting: Obstetrics and Gynecology

## 2012-02-15 ENCOUNTER — Ambulatory Visit (INDEPENDENT_AMBULATORY_CARE_PROVIDER_SITE_OTHER): Payer: Medicaid Other

## 2012-02-15 ENCOUNTER — Other Ambulatory Visit: Payer: Medicaid Other

## 2012-02-15 VITALS — BP 100/60 | Wt 184.0 lb

## 2012-02-15 DIAGNOSIS — Q270 Congenital absence and hypoplasia of umbilical artery: Secondary | ICD-10-CM

## 2012-02-15 DIAGNOSIS — IMO0001 Reserved for inherently not codable concepts without codable children: Secondary | ICD-10-CM

## 2012-02-15 DIAGNOSIS — Z331 Pregnant state, incidental: Secondary | ICD-10-CM

## 2012-02-15 DIAGNOSIS — Z3490 Encounter for supervision of normal pregnancy, unspecified, unspecified trimester: Secondary | ICD-10-CM

## 2012-02-15 NOTE — Progress Notes (Signed)
[redacted]w[redacted]d Ultrasound today 4 lbs. 5 oz. 64% cervix 3.3 cm AFI of 15 cm Vertex posterior placenta and normal fluid and cervix is closed No complaints Doing well Rec repeat EFW in 4wks secondary to 2V cord

## 2012-02-19 LAB — US OB FOLLOW UP

## 2012-02-29 ENCOUNTER — Ambulatory Visit (INDEPENDENT_AMBULATORY_CARE_PROVIDER_SITE_OTHER): Payer: Medicaid Other | Admitting: Obstetrics and Gynecology

## 2012-02-29 ENCOUNTER — Encounter: Payer: Self-pay | Admitting: Obstetrics and Gynecology

## 2012-02-29 VITALS — BP 102/62 | Wt 189.0 lb

## 2012-02-29 DIAGNOSIS — Z331 Pregnant state, incidental: Secondary | ICD-10-CM

## 2012-02-29 NOTE — Progress Notes (Signed)
[redacted]w[redacted]d Nausea recurring with GERD: suggest Zantac. If not improving, will add Protonix

## 2012-02-29 NOTE — Progress Notes (Signed)
[redacted]w[redacted]d No complaints.  Received flu 10/04/20103

## 2012-03-18 ENCOUNTER — Inpatient Hospital Stay (HOSPITAL_COMMUNITY)
Admission: AD | Admit: 2012-03-18 | Discharge: 2012-03-18 | Disposition: A | Discharge status: Home / Self Care | Payer: Medicaid Other | Source: Ambulatory Visit | Attending: Obstetrics and Gynecology | Admitting: Obstetrics and Gynecology

## 2012-03-18 ENCOUNTER — Telehealth: Payer: Self-pay | Admitting: Obstetrics and Gynecology

## 2012-03-18 ENCOUNTER — Encounter (HOSPITAL_COMMUNITY): Payer: Self-pay

## 2012-03-18 ENCOUNTER — Other Ambulatory Visit: Payer: Self-pay | Admitting: Obstetrics and Gynecology

## 2012-03-18 DIAGNOSIS — O09899 Supervision of other high risk pregnancies, unspecified trimester: Secondary | ICD-10-CM

## 2012-03-18 DIAGNOSIS — F32A Depression, unspecified: Secondary | ICD-10-CM

## 2012-03-18 DIAGNOSIS — O99891 Other specified diseases and conditions complicating pregnancy: Secondary | ICD-10-CM | POA: Insufficient documentation

## 2012-03-18 DIAGNOSIS — F329 Major depressive disorder, single episode, unspecified: Secondary | ICD-10-CM

## 2012-03-18 DIAGNOSIS — W19XXXA Unspecified fall, initial encounter: Secondary | ICD-10-CM

## 2012-03-18 DIAGNOSIS — Z9889 Other specified postprocedural states: Secondary | ICD-10-CM

## 2012-03-18 DIAGNOSIS — W1809XA Striking against other object with subsequent fall, initial encounter: Secondary | ICD-10-CM | POA: Insufficient documentation

## 2012-03-18 DIAGNOSIS — D649 Anemia, unspecified: Secondary | ICD-10-CM

## 2012-03-18 DIAGNOSIS — Y93K1 Activity, walking an animal: Secondary | ICD-10-CM | POA: Insufficient documentation

## 2012-03-18 DIAGNOSIS — IMO0002 Reserved for concepts with insufficient information to code with codable children: Secondary | ICD-10-CM | POA: Insufficient documentation

## 2012-03-18 NOTE — MAU Note (Signed)
Patient states she had a fall on a rocky driveway at about 1640. Fell on the left side but did not hit the abdomen direct. Has an abrasion on the left elbow. Has felt fetal movement since the fall. Denies bleeding or leaking and has not felt contractions.

## 2012-03-18 NOTE — Telephone Encounter (Signed)
Tc from pt, is 36w 4d, states her dog just knocked her over outside, pt fell on the rocks on the side of her belly/hip/elbow areas.  Denies any bldg or pain at this time.  Has had +fm today.  Per VL pt to MAU for prolonged monitoring.  Pt voices agreement. Advised pt to get something to eat or bring something to eat d/t the length of monitoring.

## 2012-03-18 NOTE — MAU Provider Note (Signed)
History   Michelle Page is a 36y.o. MWF at [redacted]w[redacted]d who presents for extended monitoring s/p fall around 1630.  Pt reports her dogs had gotten out and she went and got them down the road from her home.  She put them in back of car, and when she got back into driveway and started to get them out, she had one by the collar, and he darted off while she still had a hold of him, and it pulled her down onto partial gravel drive and partial grass in yard.  She does not think hit her abdomen in fall, or her head.  She reports FM since incident.  No VB or LOF.  Occ'l ctx, but not different since incident.  She landed on Lt hip and has some abrasion on Lt elbow and posterior forearm; no bleeding from abrasions.  Her left hip area is sore.  She has no other pain.  She denies recent illness, fever, resp, or GI c/o's.   .. Patient Active Problem List  Diagnosis  . Hx LEEP (loop electrosurgical excision procedure), cervix, pregnancy  . Depression  . Prior pregnancy complicated by PIH, antepartum, third trimester  . Anemia  . Yeast infection  . Chlamydia infection  . Abnormal Pap smear  . H/O candidiasis  . Cancer  . Heart murmur  . High risk HPV infection  . Pregnancy induced hypertension  . Rash of neck  . Two vessel umbilical cord, antepartum    CSN: [id number]  Arrival date and time: 03/18/12 1843   None     Chief Complaint  Patient presents with  . Fall   HPI  OB History    Grav Para Term Preterm Abortions TAB SAB Ect Mult Living   6 4 4  1 1    4       Past Medical History  Diagnosis Date  . Yeast infection   . Chlamydia infection   . H/O varicella   . Abnormal Pap smear     colpo/leep  . H/O candidiasis   . Headache     migraines as a child   . 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 Heart murmur   . Anemia   . Depression   . High risk HPV infection   . Pregnancy induced hypertension     1st & 2nd pregnancy  . Two vessel umbilical cord, antepartum 11/26/2011    Past Surgical  History  Procedure Date  . Wisdom tooth extraction   . Leep   . Colposcopy     Family History  Problem Relation Age of Onset  . Alcohol abuse Father   . Alcohol abuse Brother   . COPD Paternal Grandmother   . Heart disease Paternal Grandfather     enlarged heart    History  Substance Use Topics  . Smoking status: Never Smoker   . Smokeless tobacco: Never Used  . Alcohol Use: No    Allergies: No Known Allergies  Prescriptions prior to admission  Medication Sig Dispense Refill  . acetaminophen (TYLENOL) 500 MG tablet Take 750 mg by mouth every 6 (six) hours as needed. For headache      . calcium carbonate (TUMS - DOSED IN MG ELEMENTAL CALCIUM) 500 MG chewable tablet Chew 2 tablets by mouth daily.      . Docosahexaenoic Acid (DHA COMPLETE PO) Take 1 capsule by mouth daily.      . Prenatal Vit w/Fe-Methylfol-FA (PNV PO) Take 10 mg by mouth.  ROS--see HPI above Physical Exam   Blood pressure 121/68, pulse 83, temperature 97.4 F (36.3 C), temperature source Oral, resp. rate 16, last menstrual period 07/06/2011, SpO2 100.00%, unknown if currently breastfeeding.  Physical Exam  Constitutional: She is oriented to person, place, and time. She appears well-developed and well-nourished. No distress.  HENT:  Head: Normocephalic and atraumatic.  Eyes: Pupils are equal, round, and reactive to light.  Cardiovascular: Normal rate.   Respiratory: Effort normal.  GI: Soft.       Gravid--no abrasions or bruises  Genitourinary:       deferred  Neurological: She is alert and oriented to person, place, and time.  Skin: Skin is warm and dry.  Psychiatric: She has a normal mood and affect. Her behavior is normal. Judgment and thought content normal.    MAU Course  Procedures 1. Extended external monitoring  Assessment and Plan  1. [redacted]w[redacted]d 2. S/p fall 3. Reactive and reassuring FHT 4. Intact abrasions on Lt elbow/posterior forearm 5. A pos  1.  D/c'd home after extended  monitoring w/ FKC, labor precautions, and fall precautions 2. Offered pt pain medicine while here, and pt declined 3. F/u tomorrow for ROB and GBS at office, or prn any decreased FM, VB, or questions or concerns  Rilda Bulls H 03/18/2012, 10:16 PM

## 2012-03-19 ENCOUNTER — Ambulatory Visit (INDEPENDENT_AMBULATORY_CARE_PROVIDER_SITE_OTHER): Payer: Medicaid Other | Admitting: Obstetrics and Gynecology

## 2012-03-19 ENCOUNTER — Encounter: Payer: Self-pay | Admitting: Obstetrics and Gynecology

## 2012-03-19 VITALS — BP 108/68 | Wt 191.0 lb

## 2012-03-19 DIAGNOSIS — Z331 Pregnant state, incidental: Secondary | ICD-10-CM

## 2012-03-19 NOTE — Progress Notes (Signed)
Pt w/o complaint, gbs today.

## 2012-03-19 NOTE — Patient Instructions (Signed)
Exercise During Pregnancy It is possible to maintain a healthy exercise program throughout a pregnancy. However, it is important to discuss exercising with your caregiver, so that the two of you may develop an appropriate exercise program. It is important to remember that exercise during pregnancy should be use to maintain one's health and not to lose weight. Strenuous activities should be avoided as the may cause the baby to have difficulty obtaining proper amounts of oxygen. A proper pregnancy exercise plan has many benefits including preparing you for the physical challenges of childbirth by strengthening the muscles that help with childbirth, reducing common backaches, alleviating constipation, improving posture, elevating mood, and promoting better sleep. If possible, begin exercising regularly before you become pregnant and continue through the duration of the pregnancy. It is difficult for a woman to begin an exercise program later in a pregnancy due to enlargement of the uterus and breasts and a shift in the center of gravity. Pregnancy exercise programs should be aimed at improving the muscles of the heart, back, pelvis, and abdomen. GENERAL GUIDELINES  Every woman and pregnancy is different; thus, the level of exercise you can do depends on your health, the conditions of the pregnancy, and activity level before pregnancy. For women who were sedentary before pregnancy, walking is a good way to begin. Use caution while participation in sports during pregnancy, because your center of gravity changes and may affect your balance or that require rapid movements. Always make sure to drink plenty of fluids to avoid dehydration, which may decrease blood flow to your baby. Avoid any activity that has the potential for trauma to the abdomen. If possible try to avoid high-altitude activities, which can deprive you and your baby of oxygen; this may cause premature labor. Talk with your caregiver.  Performing a  proper warm up and cool down are very important. It is important to start slowly and build up to more demanding exercises. Toward the end of an exercise session, gradually slow your activity. Perhaps try and work back through the exercises in reverse order. Check your pulse during peak activity, and discuss with your caregiver an appropriate range of heart rate for activity. Slow down your activity if your heart starts beating faster than the target range recommended by your health care provider. Do not exceed a heart rate of 140 beats per minute. Exercise that is too strenuous may speed up the baby's heartbeat to a dangerous level. In general, if you are able to carry on a conversation comfortably while exercising, your heart rate is probably within the recommended limits. Check to make sure.  You should stop exercising and call your health care provider if you have any unusual symptoms, such as pain, uterine contractions, chest pain, bleeding or fluid leakage from the vagina, dizziness, or shortness of breath. Talk to your health caregiver if you have any questions.  Document Released: 04/16/2005 Document Revised: 07/09/2011 Document Reviewed: 07/29/2008 [facility] Patient Information 2013 [place], [place].

## 2012-03-19 NOTE — Progress Notes (Signed)
[redacted]w[redacted]d A/P GBS done today Fetal kick counts reviewed Labor reviewed with pt All patients  questions answered  

## 2012-03-21 LAB — STREP B DNA PROBE: GBSP: POSITIVE

## 2012-03-26 ENCOUNTER — Ambulatory Visit (INDEPENDENT_AMBULATORY_CARE_PROVIDER_SITE_OTHER): Payer: Medicaid Other | Admitting: Obstetrics and Gynecology

## 2012-03-26 VITALS — BP 112/64 | Wt 191.0 lb

## 2012-03-26 DIAGNOSIS — Z331 Pregnant state, incidental: Secondary | ICD-10-CM

## 2012-03-26 NOTE — Progress Notes (Signed)
[redacted]w[redacted]d No complaints today.   

## 2012-03-26 NOTE — Progress Notes (Signed)
[redacted]w[redacted]d Doing okay. Positive beta strep.  We'll treat during labor.  Patient told to come to the hospital early (last labor 1 hour). Return office in 1 week. Dr. Stefano Gaul

## 2012-04-07 ENCOUNTER — Encounter: Payer: Self-pay | Admitting: Obstetrics and Gynecology

## 2012-04-07 ENCOUNTER — Ambulatory Visit (INDEPENDENT_AMBULATORY_CARE_PROVIDER_SITE_OTHER): Payer: Medicaid Other | Admitting: Obstetrics and Gynecology

## 2012-04-07 VITALS — BP 112/68 | Wt 195.0 lb

## 2012-04-07 DIAGNOSIS — O368190 Decreased fetal movements, unspecified trimester, not applicable or unspecified: Secondary | ICD-10-CM

## 2012-04-07 NOTE — Patient Instructions (Signed)
Fetal Movement Counts Patient Name: __________________________________________________ Patient Due Date: ____________________ Kick counts is highly recommended in high risk pregnancies, but it is a good idea for every pregnant woman to do. Start counting fetal movements at 28 weeks of the pregnancy. Fetal movements increase after eating a full meal or eating or drinking something sweet (the blood sugar is higher). It is also important to drink plenty of fluids (well hydrated) before doing the count. Lie on your left side because it helps with the circulation or you can sit in a comfortable chair with your arms over your belly (abdomen) with no distractions around you. DOING THE COUNT  Try to do the count the same time of day each time you do it.  Mark the day and time, then see how long it takes for you to feel 10 movements (kicks, flutters, swishes, rolls). You should have at least 10 movements within 2 hours. You will most likely feel 10 movements in much less than 2 hours. If you do not, wait an hour and count again. After a couple of days you will see a pattern.  What you are looking for is a change in the pattern or not enough counts in 2 hours. Is it taking longer in time to reach 10 movements? SEEK MEDICAL CARE IF:  You feel less than 10 counts in 2 hours. Tried twice.  No movement in one hour.  The pattern is changing or taking longer each day to reach 10 counts in 2 hours.  You feel the baby is not moving as it usually does. Date: ____________ Movements: ____________ Start time: ____________ Finish time: ____________  Date: ____________ Movements: ____________ Start time: ____________ Finish time: ____________ Date: ____________ Movements: ____________ Start time: ____________ Finish time: ____________ Date: ____________ Movements: ____________ Start time: ____________ Finish time: ____________ Date: ____________ Movements: ____________ Start time: ____________ Finish time:  ____________ Date: ____________ Movements: ____________ Start time: ____________ Finish time: ____________ Date: ____________ Movements: ____________ Start time: ____________ Finish time: ____________ Date: ____________ Movements: ____________ Start time: ____________ Finish time: ____________  Date: ____________ Movements: ____________ Start time: ____________ Finish time: ____________ Date: ____________ Movements: ____________ Start time: ____________ Finish time: ____________ Date: ____________ Movements: ____________ Start time: ____________ Finish time: ____________ Date: ____________ Movements: ____________ Start time: ____________ Finish time: ____________ Date: ____________ Movements: ____________ Start time: ____________ Finish time: ____________ Date: ____________ Movements: ____________ Start time: ____________ Finish time: ____________ Date: ____________ Movements: ____________ Start time: ____________ Finish time: ____________  Date: ____________ Movements: ____________ Start time: ____________ Finish time: ____________ Date: ____________ Movements: ____________ Start time: ____________ Finish time: ____________ Date: ____________ Movements: ____________ Start time: ____________ Finish time: ____________ Date: ____________ Movements: ____________ Start time: ____________ Finish time: ____________ Date: ____________ Movements: ____________ Start time: ____________ Finish time: ____________ Date: ____________ Movements: ____________ Start time: ____________ Finish time: ____________ Date: ____________ Movements: ____________ Start time: ____________ Finish time: ____________  Date: ____________ Movements: ____________ Start time: ____________ Finish time: ____________ Date: ____________ Movements: ____________ Start time: ____________ Finish time: ____________ Date: ____________ Movements: ____________ Start time: ____________ Finish time: ____________ Date: ____________ Movements:  ____________ Start time: ____________ Finish time: ____________ Date: ____________ Movements: ____________ Start time: ____________ Finish time: ____________ Date: ____________ Movements: ____________ Start time: ____________ Finish time: ____________ Date: ____________ Movements: ____________ Start time: ____________ Finish time: ____________  Date: ____________ Movements: ____________ Start time: ____________ Finish time: ____________ Date: ____________ Movements: ____________ Start time: ____________ Finish time: ____________ Date: ____________ Movements: ____________ Start time: ____________ Finish time: ____________ Date: ____________ Movements:   ____________ Start time: ____________ Finish time: ____________ Date: ____________ Movements: ____________ Start time: ____________ Finish time: ____________ Date: ____________ Movements: ____________ Start time: ____________ Finish time: ____________ Date: ____________ Movements: ____________ Start time: ____________ Finish time: ____________  Date: ____________ Movements: ____________ Start time: ____________ Finish time: ____________ Date: ____________ Movements: ____________ Start time: ____________ Finish time: ____________ Date: ____________ Movements: ____________ Start time: ____________ Finish time: ____________ Date: ____________ Movements: ____________ Start time: ____________ Finish time: ____________ Date: ____________ Movements: ____________ Start time: ____________ Finish time: ____________ Date: ____________ Movements: ____________ Start time: ____________ Finish time: ____________ Date: ____________ Movements: ____________ Start time: ____________ Finish time: ____________  Date: ____________ Movements: ____________ Start time: ____________ Finish time: ____________ Date: ____________ Movements: ____________ Start time: ____________ Finish time: ____________ Date: ____________ Movements: ____________ Start time: ____________ Finish  time: ____________ Date: ____________ Movements: ____________ Start time: ____________ Finish time: ____________ Date: ____________ Movements: ____________ Start time: ____________ Finish time: ____________ Date: ____________ Movements: ____________ Start time: ____________ Finish time: ____________ Date: ____________ Movements: ____________ Start time: ____________ Finish time: ____________  Date: ____________ Movements: ____________ Start time: ____________ Finish time: ____________ Date: ____________ Movements: ____________ Start time: ____________ Finish time: ____________ Date: ____________ Movements: ____________ Start time: ____________ Finish time: ____________ Date: ____________ Movements: ____________ Start time: ____________ Finish time: ____________ Date: ____________ Movements: ____________ Start time: ____________ Finish time: ____________ Date: ____________ Movements: ____________ Start time: ____________ Finish time: ____________ Document Released: 05/16/2006 Document Revised: 07/09/2011 Document Reviewed: 11/16/2008 [employer] Patient Information 2013 [employer].  

## 2012-04-07 NOTE — Progress Notes (Signed)
Pt stated had some green discharge the other day. Been having a lot of irritability contractions. Pt stated no other issues today.

## 2012-04-07 NOTE — Progress Notes (Signed)
[redacted]w[redacted]d A/P GBS positive Fetal kick counts reviewed Labor reviewed with pt All patients  questions answered Pt c/o decreased fetal movement will do nst NST reactive

## 2012-04-08 ENCOUNTER — Telehealth (HOSPITAL_COMMUNITY): Payer: Self-pay | Admitting: *Deleted

## 2012-04-08 ENCOUNTER — Inpatient Hospital Stay (HOSPITAL_COMMUNITY)
Admission: AD | Admit: 2012-04-08 | Discharge: 2012-04-10 | DRG: 775 | Disposition: A | Discharge status: Home / Self Care | Payer: Medicaid Other | Source: Ambulatory Visit | Attending: Obstetrics and Gynecology | Admitting: Obstetrics and Gynecology

## 2012-04-08 ENCOUNTER — Encounter (HOSPITAL_COMMUNITY): Payer: Self-pay | Admitting: *Deleted

## 2012-04-08 ENCOUNTER — Encounter (HOSPITAL_COMMUNITY): Payer: Self-pay | Admitting: Obstetrics and Gynecology

## 2012-04-08 DIAGNOSIS — F32A Depression, unspecified: Secondary | ICD-10-CM

## 2012-04-08 DIAGNOSIS — O99892 Other specified diseases and conditions complicating childbirth: Secondary | ICD-10-CM | POA: Diagnosis present

## 2012-04-08 DIAGNOSIS — F329 Major depressive disorder, single episode, unspecified: Secondary | ICD-10-CM

## 2012-04-08 DIAGNOSIS — O09899 Supervision of other high risk pregnancies, unspecified trimester: Secondary | ICD-10-CM

## 2012-04-08 DIAGNOSIS — Z9889 Other specified postprocedural states: Secondary | ICD-10-CM

## 2012-04-08 DIAGNOSIS — Z22330 Carrier of Group B streptococcus: Secondary | ICD-10-CM

## 2012-04-08 DIAGNOSIS — O3440 Maternal care for other abnormalities of cervix, unspecified trimester: Secondary | ICD-10-CM

## 2012-04-08 DIAGNOSIS — D649 Anemia, unspecified: Secondary | ICD-10-CM

## 2012-04-08 DIAGNOSIS — Z641 Problems related to multiparity: Secondary | ICD-10-CM | POA: Diagnosis present

## 2012-04-08 DIAGNOSIS — O99820 Streptococcus B carrier state complicating pregnancy: Secondary | ICD-10-CM

## 2012-04-08 LAB — CBC
HCT: 35.4 % — ABNORMAL LOW (ref 36.0–46.0)
Hemoglobin: 12.1 g/dL (ref 12.0–15.0)
MCH: 30.9 pg (ref 26.0–34.0)
MCHC: 34.2 g/dL (ref 30.0–36.0)
MCV: 90.3 fL (ref 78.0–100.0)
Platelets: 162 K/uL (ref 150–400)
RBC: 3.92 MIL/uL (ref 3.87–5.11)
RDW: 13.3 % (ref 11.5–15.5)
WBC: 8.9 K/uL (ref 4.0–10.5)

## 2012-04-08 LAB — TYPE AND SCREEN
ABO/RH(D): A POS
Antibody Screen: NEGATIVE

## 2012-04-08 LAB — ABO/RH: ABO/RH(D): A POS

## 2012-04-08 MED ORDER — DIPHENHYDRAMINE HCL 25 MG PO CAPS: 25.0000 mg | ORAL_CAPSULE | Freq: Four times a day (QID) | ORAL | Status: DC | PRN

## 2012-04-08 MED ORDER — IBUPROFEN 600 MG PO TABS
600.0000 mg | ORAL_TABLET | Freq: Four times a day (QID) | ORAL | Status: DC
  Administered 2012-04-09 – 2012-04-10 (×7): 600 mg via ORAL
  Filled 2012-04-08 (×6): qty 1

## 2012-04-08 MED ORDER — ACETAMINOPHEN 500 MG PO TABS: 1000.0000 mg | ORAL_TABLET | ORAL | Status: DC | PRN

## 2012-04-08 MED ORDER — DEXTROSE 5 % IV SOLN
2.5000 10*6.[IU] | INTRAVENOUS | Status: DC
  Filled 2012-04-08 (×3): qty 2.5

## 2012-04-08 MED ORDER — ONDANSETRON HCL 4 MG/2ML IJ SOLN: 4.0000 mg | Freq: Four times a day (QID) | INTRAMUSCULAR | Status: DC | PRN

## 2012-04-08 MED ORDER — PENICILLIN G POTASSIUM 5000000 UNITS IJ SOLR
5.0000 10*6.[IU] | Freq: Once | INTRAVENOUS | Status: AC
  Administered 2012-04-08: 5 10*6.[IU] via INTRAVENOUS
  Filled 2012-04-08: qty 5

## 2012-04-08 MED ORDER — BENZOCAINE-MENTHOL 20-0.5 % EX AERO: 1.0000 "application " | INHALATION_SPRAY | CUTANEOUS | Status: DC | PRN

## 2012-04-08 MED ORDER — OXYCODONE-ACETAMINOPHEN 5-325 MG PO TABS
1.0000 | ORAL_TABLET | ORAL | Status: DC | PRN
Start: 2012-04-08 — End: 2012-04-08

## 2012-04-08 MED ORDER — MEASLES, MUMPS & RUBELLA VAC ~~LOC~~ INJ
0.5000 mL | INJECTION | Freq: Once | SUBCUTANEOUS | Status: DC
  Filled 2012-04-08: qty 0.5

## 2012-04-08 MED ORDER — IBUPROFEN 600 MG PO TABS
600.0000 mg | ORAL_TABLET | Freq: Four times a day (QID) | ORAL | Status: DC | PRN
  Administered 2012-04-08: 600 mg via ORAL
  Filled 2012-04-08: qty 1

## 2012-04-08 MED ORDER — DIBUCAINE 1 % RE OINT: 1.0000 "application " | TOPICAL_OINTMENT | RECTAL | Status: DC | PRN

## 2012-04-08 MED ORDER — LACTATED RINGERS IV SOLN: 500.0000 mL | INTRAVENOUS | Status: DC | PRN

## 2012-04-08 MED ORDER — FENTANYL CITRATE 0.05 MG/ML IJ SOLN: 100.0000 ug | INTRAMUSCULAR | Status: DC | PRN

## 2012-04-08 MED ORDER — OXYCODONE-ACETAMINOPHEN 5-325 MG PO TABS
1.0000 | ORAL_TABLET | ORAL | Status: DC | PRN
  Administered 2012-04-08 – 2012-04-09 (×3): 1 via ORAL
  Administered 2012-04-10: 2 via ORAL
  Filled 2012-04-08 (×2): qty 1
  Filled 2012-04-08: qty 2
  Filled 2012-04-08: qty 1

## 2012-04-08 MED ORDER — SENNOSIDES-DOCUSATE SODIUM 8.6-50 MG PO TABS
2.0000 | ORAL_TABLET | Freq: Every day | ORAL | Status: DC
  Administered 2012-04-09 (×2): 2 via ORAL

## 2012-04-08 MED ORDER — PRENATAL MULTIVITAMIN CH
1.0000 | ORAL_TABLET | Freq: Every day | ORAL | Status: DC
  Administered 2012-04-09 – 2012-04-10 (×2): 1 via ORAL
  Filled 2012-04-08 (×2): qty 1

## 2012-04-08 MED ORDER — ONDANSETRON HCL 4 MG/2ML IJ SOLN: 4.0000 mg | INTRAMUSCULAR | Status: DC | PRN

## 2012-04-08 MED ORDER — SIMETHICONE 80 MG PO CHEW: 80.0000 mg | CHEWABLE_TABLET | ORAL | Status: DC | PRN

## 2012-04-08 MED ORDER — ONDANSETRON HCL 4 MG PO TABS: 4.0000 mg | ORAL_TABLET | ORAL | Status: DC | PRN

## 2012-04-08 MED ORDER — BISACODYL 10 MG RE SUPP: 10.0000 mg | Freq: Every day | RECTAL | Status: DC | PRN

## 2012-04-08 MED ORDER — LANOLIN HYDROUS EX OINT: TOPICAL_OINTMENT | CUTANEOUS | Status: DC | PRN

## 2012-04-08 MED ORDER — WITCH HAZEL-GLYCERIN EX PADS: 1.0000 "application " | MEDICATED_PAD | CUTANEOUS | Status: DC | PRN

## 2012-04-08 MED ORDER — CITRIC ACID-SODIUM CITRATE 334-500 MG/5ML PO SOLN: 30.0000 mL | ORAL | Status: DC | PRN

## 2012-04-08 MED ORDER — FLEET ENEMA 7-19 GM/118ML RE ENEM: 1.0000 | ENEMA | Freq: Every day | RECTAL | Status: DC | PRN

## 2012-04-08 MED ORDER — ZOLPIDEM TARTRATE 5 MG PO TABS: 5.0000 mg | ORAL_TABLET | Freq: Every evening | ORAL | Status: DC | PRN

## 2012-04-08 MED ORDER — LIDOCAINE HCL (PF) 1 % IJ SOLN
30.0000 mL | INTRAMUSCULAR | Status: DC | PRN
  Administered 2012-04-08: 30 mL via SUBCUTANEOUS
  Filled 2012-04-08: qty 30

## 2012-04-08 MED ORDER — OXYTOCIN 40 UNITS IN LACTATED RINGERS INFUSION - SIMPLE MED
62.5000 mL/h | INTRAVENOUS | Status: DC
  Filled 2012-04-08: qty 1000

## 2012-04-08 MED ORDER — LACTATED RINGERS IV SOLN
INTRAVENOUS | Status: DC
  Administered 2012-04-08: 16:00:00 via INTRAVENOUS

## 2012-04-08 MED ORDER — TETANUS-DIPHTH-ACELL PERTUSSIS 5-2.5-18.5 LF-MCG/0.5 IM SUSP: 0.5000 mL | Freq: Once | INTRAMUSCULAR | Status: DC

## 2012-04-08 MED ORDER — OXYTOCIN BOLUS FROM INFUSION
500.0000 mL | INTRAVENOUS | Status: DC
  Administered 2012-04-08: 500 mL via INTRAVENOUS

## 2012-04-08 NOTE — Telephone Encounter (Signed)
Preadmission screen  

## 2012-04-08 NOTE — H&P (Signed)
Michelle Page is a 36 y.o. female presenting for ctx that more regular since 9am. Denies LOF or VB, GFM, some increased mucous d/c.    HPI: Pt began PNC at [facility] at 9wks. She had [place] for viability at that time which was c/w LMP dating w [facility] 04/11/12. 1st trimester screen was normal. [place] at 19wks for anatomy noted 2VC and limited anatomy views due to fetal position. F/U anatomy at 24wks and growth normal. [place] at 32wks for growth was normal. 1hr gtt was elevated w 3hr gtt WNL. Otherwise uncomplicated prenatal course, GBS positive at 36wks.    Maternal Medical History:  Reason for admission: Reason for admission: contractions.  Contractions: Onset was 3-5 hours ago.   Frequency: regular.   Duration is approximately 60 seconds.   Perceived severity is moderate.    Fetal activity: Perceived fetal activity is normal.   Last perceived fetal movement was within the past hour.    Prenatal complications: no prenatal complications   OB History    Grav Para Term Preterm Abortions TAB SAB Ect Mult Living   6 4 4  1 1    4      Past Medical History  Diagnosis Date  . Yeast infection   . Chlamydia infection   . H/O varicella   . Abnormal Pap smear     colpo/leep  . H/O candidiasis   . Headache     migraines as a child   . 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 Heart murmur   . Anemia   . Depression   . High risk HPV infection   . Pregnancy induced hypertension     1st & 2nd pregnancy  . Two vessel umbilical cord, antepartum 11/26/2011   Past Surgical History  Procedure Date  . Wisdom tooth extraction   . Leep   . Colposcopy    Family History: family history includes Alcohol abuse in her brother and father; COPD in her paternal grandmother; and Heart disease in her paternal grandfather. Social History:  reports that she has never smoked. She has never used smokeless tobacco. She reports that she does not drink alcohol or use illicit drugs.   Prenatal Transfer Tool  Maternal Diabetes: No   Genetic Screening: Normal Maternal Ultrasounds/Referrals: Normal 2VC Fetal Ultrasounds or other Referrals:  None Maternal Substance Abuse:  No Significant Maternal Medications:  None Significant Maternal Lab Results:  Lab values include: Group B Strep positive Other Comments:  None  Review of Systems  All other systems reviewed and are negative.    Dilation: 4 Effacement (%): 90 Station: -2 Exam by:: Venia Carbon RN  Last menstrual period 07/06/2011, unknown if currently breastfeeding. Maternal Exam:  Uterine Assessment: Contraction strength is moderate.  Contraction duration is 60 seconds. Contraction frequency is regular.   Abdomen: Patient reports no abdominal tenderness. Fundal height is aga.   Estimated fetal weight is 7.   Fetal presentation: vertex  Introitus: Normal vulva. Normal vagina.  Pelvis: adequate for delivery.   Cervix: Cervix evaluated by digital exam.     Fetal Exam Fetal Monitor Review: Mode: ultrasound.   Baseline rate: 130.  Variability: moderate (6-25 bpm).   Pattern: accelerations present and no decelerations.    Fetal State Assessment: Category I - tracings are normal.     Physical Exam  Nursing note and vitals reviewed. Constitutional: She is oriented to person, place, and time. She appears well-developed and well-nourished.  HENT:  Head: Normocephalic.  Eyes: Pupils are equal,  round, and reactive to light.  Neck: Normal range of motion.  Cardiovascular: Normal rate, regular rhythm and normal heart sounds.   Respiratory: Effort normal and breath sounds normal.  GI: Soft. Bowel sounds are normal.  Genitourinary: Vagina normal.  Musculoskeletal: Normal range of motion.  Neurological: She is alert and oriented to person, place, and time. She has normal reflexes.  Skin: Skin is warm and dry.  Psychiatric: She has a normal mood and affect. Her behavior is normal.    Prenatal labs: ABO, Rh: A/POS/-- (05/15 1705) Antibody: NEG (05/15  1705) Rubella: 87.4 (05/15 1705) RPR: NON REAC (09/20 1155)  HBsAg: NEGATIVE (05/15 1705)  HIV: NON REACTIVE (05/15 1705)  GBS: POSITIVE (11/20 1433)   Assessment/Plan: IUP at [redacted]w[redacted]d GBS pos FHR reassuring Early/active labor Grand multiparity  Hx rapid labor  Admit to b.s per c/w Dr Normand Sloop Routine L&D orders Pen G per protocol for GBS pos   Glendoris Nodarse M 04/08/2012, 3:31 PM

## 2012-04-08 NOTE — Progress Notes (Signed)
1525: Fetal strip can be found under under this medical record number: [id number] from 1520-1528. This fetal strip from this time was chart under the wrong patient chart.

## 2012-04-09 LAB — CBC
HCT: 35.5 % — ABNORMAL LOW (ref 36.0–46.0)
Hemoglobin: 11.9 g/dL — ABNORMAL LOW (ref 12.0–15.0)
MCH: 30.7 pg (ref 26.0–34.0)
MCHC: 33.5 g/dL (ref 30.0–36.0)
MCV: 91.5 fL (ref 78.0–100.0)
Platelets: 161 10*3/uL (ref 150–400)
RBC: 3.88 MIL/uL (ref 3.87–5.11)
RDW: 13.5 % (ref 11.5–15.5)
WBC: 11.2 10*3/uL — ABNORMAL HIGH (ref 4.0–10.5)

## 2012-04-09 LAB — RPR: RPR Ser Ql: NONREACTIVE

## 2012-04-09 NOTE — Progress Notes (Signed)

## 2012-04-09 NOTE — Progress Notes (Signed)
S: comfortable, little bleeding, slept     breastfeeding O BP 107/72  Pulse 77  Temp 97.2 F (36.2 C) (Oral)  Resp 20  Ht 5\' 4"  (1.626 m)  Wt 195 lb (88.451 kg)  BMI 33.47 kg/m2  SpO2 97%  LMP 07/06/2011  Breastfeeding? Unknown     abd soft, nt, ff      sm  Flow perineum clean intact no edema,1 degree perineal lace     -Homans sign bilaterally,       No edema    Component Value Date/Time   HGB 11.9* 04/09/2012 0535   HCT 35.5* 04/09/2012 0535   A normal involution     Lactating     PP day 1 P plans natural family planning, continue care Lavera Guise, CNM

## 2012-04-09 NOTE — Progress Notes (Signed)
UR chart review completed.  

## 2012-04-10 MED ORDER — IBUPROFEN 600 MG PO TABS: 600.0000 mg | ORAL_TABLET | Freq: Four times a day (QID) | ORAL | Status: DC | PRN

## 2012-04-10 MED ORDER — OXYCODONE-ACETAMINOPHEN 5-325 MG PO TABS: 1.0000 | ORAL_TABLET | ORAL | Status: DC | PRN

## 2012-04-10 NOTE — Discharge Summary (Signed)
Physician Discharge Summary  Patient ID: Allizon LAMEKIA NOLDEN MRN: [id number] DOB/AGE: [birth year] 36 y.o.  Admit date: 04/08/2012 Discharge date: 04/10/2012  Admission Diagnoses: [redacted]w[redacted]d active labor GBS positive  Discharge Diagnoses:  Active Problems:  SVD (spontaneous vaginal delivery)   Discharged Condition: stable  Hospital Course: [redacted]w[redacted]d active labor, SVD, 1 degree perineal lac, normal involution, lactating, plans natural family planning  Discharge Exam: Blood pressure 114/78, pulse 79, temperature 97.6 F (36.4 C), temperature source Oral, resp. rate 18, height 5\' 4"  (1.626 m), weight 195 lb (88.451 kg), last menstrual period 07/06/2011, SpO2 97.00%, unknown if currently breastfeeding. S: comfortable, little bleeding, slept     breastfeeding O BP 114/78  Pulse 79  Temp 97.6 F (36.4 C) (Oral)  Resp 18  Ht 5\' 4"  (1.626 m)  Wt 195 lb (88.451 kg)  BMI 33.47 kg/m2  SpO2 97%  LMP 07/06/2011  Breastfeeding? Unknown     abd soft, nt, ff      sm  Flow perineum clean intact, 1 degree perineal lac well approximated, no edema, or drainage     -Homans sign bilaterally,      No edema    Component Value Date/Time   HGB 11.9* 04/09/2012 0535   HCT 35.5* 04/09/2012 0535   Disposition: 01-Home or Self Care  Discharge Orders    Future Appointments: Provider: Department: Dept Phone: Center:   04/14/2012 3:45 PM Kirkland Hun, MD [facility] [phone] None     Future Orders Please Complete By Expires   Discharge patient          Medication List     As of 04/10/2012  8:46 AM    STOP taking these medications         acetaminophen 500 MG tablet   Commonly known as: TYLENOL      calcium carbonate 500 MG chewable tablet   Commonly known as: TUMS - dosed in mg elemental calcium      DHA COMPLETE PO      TAKE these medications         ibuprofen 600 MG tablet   Commonly known as: ADVIL,MOTRIN   Take 1 tablet (600 mg total) by mouth  every 6 (six) hours as needed for pain.      oxyCODONE-acetaminophen 5-325 MG per tablet   Commonly known as: PERCOCET/ROXICET   Take 1-2 tablets by mouth every 4 (four) hours as needed (moderate - severe pain).      PNV PO   Take 10 mg by mouth.           Follow-up Information    Follow up with [facility]. In 6 weeks.   Contact information:   [address] [place] [address] [phone]         Signed: Lavera Guise 04/10/2012, 8:46 AM

## 2012-04-14 ENCOUNTER — Encounter: Payer: Medicaid Other | Admitting: Obstetrics and Gynecology

## 2012-05-13 ENCOUNTER — Encounter: Payer: Medicaid Other | Admitting: Obstetrics and Gynecology

## 2012-05-19 ENCOUNTER — Ambulatory Visit: Payer: Medicaid Other | Admitting: Obstetrics and Gynecology

## 2012-05-19 ENCOUNTER — Encounter: Payer: Self-pay | Admitting: Obstetrics and Gynecology

## 2012-05-19 NOTE — Progress Notes (Signed)
Michelle Page is a 37 y.o. female who presents for a postpartum visit.   Type of delivery:  SVB by Sanda Klein, rapid labor, 1st degree laceration  Patient reports doing well, no issues.  Hx remarkable for: Patient Active Problem List  Diagnosis  . Hx LEEP (loop electrosurgical excision procedure), cervix, pregnancy  . Depression  . Prior pregnancy complicated by PIH, antepartum, third trimester  . Anemia  . Yeast infection  . Chlamydia infection  . Abnormal Pap smear  . Cancer  . Heart murmur  . High risk HPV infection  . Pregnancy induced hypertension  . Rash of neck  . SVD (spontaneous vaginal delivery)     PPDS =0, denies pp depression  Contraception plan: NFP   I have fully reviewed the prenatal and intrapartum course   Patient has not been sexually active since delivery.   The following portions of the patient's history were reviewed and updated as appropriate: allergies, current medications, past family history, past medical history, past social history, past surgical history and problem list.  Review of Systems Pertinent items are noted in HPI.   Objective:    BP 92/58  Resp 14  Ht 5\' 4"  (1.626 m)  Wt 180 lb (81.647 kg)  BMI 30.90 kg/m2  Breastfeeding? Yes  General:  alert, cooperative and no distress     Lungs: clear to auscultation bilaterally  Heart:  regular rate and rhythm, S1, S2 normal, no murmur  Abdomen: soft, non-tender; bowel sounds normal; no masses,  no organomegaly.   Incision:  NA   Vulva:  normal  Vagina: normal vagina--well healed 1st degree laceration  Cervix:  normal  Uterus: normal size, contour, position, consistency, mobility, non-tender, well-involuted  Adnexa:  normal adnexa             Assessment:     Normal postpartum exam.  Pap smear:   not done at today's visit. --due 5/14.  Plan:  Follow-up prn for annual in 2014 Rxs:  None  Nigel Bridgeman CNM, [place] 05/19/2012 11:31 AM

## 2012-05-19 NOTE — Progress Notes (Signed)
Date of delivery: 04/08/2012 Female Name: Michelle Page Vaginal delivery:yes Cesarean section:no Tubal ligation:no GDM:no Breast Feeding:yes Bottle Feeding:no Post-Partum Blues:no Abnormal pap:no Normal GU function: yes Normal GI function:yes Returning to work:no

## 2013-03-05 ENCOUNTER — Other Ambulatory Visit: Payer: Self-pay

## 2013-05-12 ENCOUNTER — Ambulatory Visit (HOSPITAL_COMMUNITY): Payer: Medicaid Other

## 2013-05-13 ENCOUNTER — Encounter (HOSPITAL_COMMUNITY): Payer: Self-pay

## 2013-05-21 ENCOUNTER — Other Ambulatory Visit (HOSPITAL_COMMUNITY)
Admission: RE | Admit: 2013-05-21 | Discharge: 2013-05-21 | Disposition: A | Discharge status: Home / Self Care | Payer: Medicaid Other | Source: Ambulatory Visit | Attending: Obstetrics & Gynecology | Admitting: Obstetrics & Gynecology

## 2013-05-21 ENCOUNTER — Ambulatory Visit (INDEPENDENT_AMBULATORY_CARE_PROVIDER_SITE_OTHER): Payer: Medicaid Other | Admitting: Obstetrics & Gynecology

## 2013-05-21 ENCOUNTER — Encounter: Payer: Self-pay | Admitting: Obstetrics & Gynecology

## 2013-05-21 VITALS — BP 133/68 | HR 76 | Temp 97.2°F | Ht 62.0 in | Wt 168.1 lb

## 2013-05-21 DIAGNOSIS — R87619 Unspecified abnormal cytological findings in specimens from cervix uteri: Secondary | ICD-10-CM | POA: Insufficient documentation

## 2013-05-21 DIAGNOSIS — IMO0002 Reserved for concepts with insufficient information to code with codable children: Secondary | ICD-10-CM

## 2013-05-21 DIAGNOSIS — R6889 Other general symptoms and signs: Secondary | ICD-10-CM

## 2013-05-21 LAB — POCT PREGNANCY, URINE
Preg Test, Ur: NEGATIVE
Preg Test, Ur: NEGATIVE

## 2013-05-21 NOTE — Progress Notes (Signed)
Patient ID: Michelle Page, female   DOB: Jun 24, 1975, 38 y.o.   MRN: [id number] Patient given informed consent, signed copy in the chart, time out was performed.  Placed in lithotomy position. Cervix viewed with speculum and colposcope after application of acetic acid.  02/16/2013 PAP LGSIL  Colposcopy adequate? yes Acetowhite lesions? no Punctation?no  Mosaicism?  no Abnormal vasculature? no Biopsies?no ECC?yes  Reviewed with pt etiology of cervical dysplasia and natural history.   Patient was given post procedure instructions.  She will return in 1 year or sooner prn.   Padme Arriaga L. Harraway-Smith, M.D., Cherlynn June

## 2013-05-21 NOTE — Patient Instructions (Signed)
Abnormal Pap Test Information During a Pap test, the cells on the surface of your cervix are checked to see if they look normal, abnormal, or if they show signs of having been altered by a certain type of virus called human papillomavirus, or HPV. Cervical cells that have been affected by HPV are called dysplasia. Dysplasia is not cancer, but describes abnormal cells found on the surface of the cervix. Depending on the degree of dysplasia, some of the cells may be considered pre-cancerous and may turn into cancer over time if follow up with a caregiver is delayed.  WHAT DOES AN ABNORMAL PAP TEST MEAN? Having an abnormal pap test does not mean that you have cancer. However, certain types of abnormal pap tests can be a sign that a person is at a higher risk of developing cancer. Your caregiver will want to do other tests to find out more about the abnormal cells. Your abnormal Pap test results could show:   Small and uncertain changes that should be carefully watched.   Cervical dysplasia that has caused mild changes and can be followed over time.  Cervical dysplasia that is more severe and needs to be followed and treated to ensure the problem goes away.  Cancer.  When severe cervical dysplasia is found and treated early, it rarely will grow into cancer.  WHAT WILL BE DONE ABOUT MY ABNORMAL PAP TEST?  A colposcopy may be needed. This is a procedure where your cervix is examined using light and magnification.  A small tissue sample of your cervix (biopsy) may need to be removed and then examined. This is often performed if there are areas that appear infected.  A sample of cells from the cervical canal may be removed with either a small brush or scraping instrument (curette). Based on the results of the procedures above, some caregivers may recommend either cryotherapy of the cervix or a surgical LEEP where a portion of the cervix is removed. LEEP is short for "loop electrical excisional  procedure." Rarely, a caregiver may recommend a cone biopsy.This is a procedure where a small, cone-shaped sample of your cervix is taken out. The part that is taken out is the area where the abnormal cells are.  WHAT IF I HAVE A DYSPLASIA OR A CANCER? You may be referred to a specialist. Radiation may also be a treatment for more advanced cancer. Having a hysterectomy is the last treatment option for dysplasia, but it is a more common treatment for someone with cancer. All treatment options will be discussed with you by your caregiver. WHAT SHOULD YOU DO AFTER BEING TREATED? If you have had an abnormal pap test, you should continue to have regular pap tests and check-ups as directed by your caregiver. Your cervical problem will be carefully watched so it does not get worse. Also, your caregiver can watch for, and treat, any new problems that may come up. Document Released: 08/01/2010 Document Revised: 08/11/2012 Document Reviewed: 04/12/2011 [facility] Patient Information 2014 [place], [place]. Cervical Dysplasia Cervical dysplasia is a condition in which a woman has abnormal changes in the cells of her cervix. The cervix is the opening to the uterus (womb). It is located between the vagina and the uterus. Cervical dysplasia may be the first sign of cervical cancer.  With early detection, treatment, and close follow-up care, nearly all cases of cervical dysplasia can be cured. If left untreated, dysplasia may become more severe.  CAUSES  Cervical dysplasia can be caused by a human papillomavirus (HPV)  infection. RISK FACTORS   Having had a sexually transmitted disease, such as chlamydia or a human papillomavirus (HPV) infection.   Becoming sexually active before age 84.   Having had more than 1 sexual partner.   Not using protection during sexual intercourse, especially with new sexual partners.   Having had cancer of the vagina or vulva.   Having a sexual partner whose previous partner  had cancer of the cervix or cervical dysplasia.   Having a sexual partner who has or has had cancer of the penis.   Having a weakened immune system (such as from having HIV or an organ transplant).   Being the daughter of a woman who took diethylstilbestrol(DES) during pregnancy.   Having a family history of cervical cancer.   Smoking. SIGNS AND SYMPTOMS  There are usually no symptoms. If there are symptoms, they may include:   Abnormal vaginal discharge.   Bleeding between periods or after intercourse.   Bleeding during menopause.   Pain during sexual intercourse (dyspareunia). DIAGNOSIS  A test called a Pap test may be done.During this test, cells are taken from the cervix and then looked at under a microscope. A test in which tissue is removed from the cervix (biopsy) may also be done if the Pap test is abnormal or if the cervix looks abnormal.  TREATMENT  Treatment varies based on the severity of the cervical dysplasia. Treatment may include:  Cryotherapy. During cryotherapy, the abnormal cells are frozen with a steel-tip instrument.   A procedure to remove abnormal tissue from the cervix.  Surgery to remove abnormal tissue. This is usually done in serious cases of cervical dysplasia. Surgical options include:  A cone biopsy. This is a procedure in which the cervical canal and a portion of the center of the cervix are removed.   Hysterectomy. This is a surgery in which the uterus and cervix are removed. HOME CARE INSTRUCTIONS   Only take over-the-counter or prescription medicines for pain or discomfort as directed by your health care provider.   Do not use tampons, have sexual intercourse, or douche until your health care provider says it is OK.  Keep follow-up appointments as directed by your health care provider. Women who have been treated for cervical dysplasia should have regular pelvic exams and Pap tests. During the first year following treatment of  cervical dysplasia, Pap tests should be done every 3 4 months. In the second year, they should be done every 6 months or as recommended by your health care provider.  To prevent the condition from developing again, practice safe sex. SEEK MEDICAL CARE IF:  You develop genital warts.  SEEK IMMEDIATE MEDICAL CARE IF:   Your menstrual period is heavier than normal.   You develop bright red bleeding, especially if you have blood clots.   You have a fever.   You have increasing cramps or pain not relieved with medicine.   You are lightheaded, unusually weak, or have fainting spells.   You have abnormal vaginal discharge.   You have abdominal pain. Document Released: 04/16/2005 Document Revised: 12/17/2012 Document Reviewed: 12/10/2012 [facility] Patient Information 2014 [place]. Pap Test A Pap test is a procedure done in a clinic office to evaluate cells that are on the surface of the cervix. The cervix is the lower portion of the uterus and upper portion of the vagina. For some women, the cervical region has the potential to form cancer. With consistent evaluations by your caregiver, this type of cancer can  be prevented.  If a Pap test is abnormal, it is most often a result of a previous exposure to human papillomavirus (HPV). HPV is a virus that can infect the cells of the cervix and cause dysplasia. Dysplasia is where the cells no longer look normal. If a woman has been diagnosed with high-grade or severe dysplasia, they are at higher risk of developing cervical cancer. People diagnosed with low-grade dysplasia should still be seen by their caregiver because there is a small chance that low-grade dysplasia could develop into cancer.  LET YOUR CAREGIVER KNOW ABOUT:  Recent sexually transmitted infection (STI) you have had.  Any new sex partners you have had.  History of previous abnormal Pap tests results.  History of previous cervical procedures you have had (colposcopy,  biopsy, loop electrosurgical excision procedure [LEEP]).  Concerns you have had regarding unusual vaginal discharge.  History of pelvic pain.  Your use of birth control. BEFORE THE PROCEDURE  Ask your caregiver when to schedule your Pap test. It is best not to be on your period if your caregiver uses a wooden spatula to collect cells or applies cells to a glass slide. Newer techniques are not so sensitive to the timing of a menstrual cycle.  Do not douche or have sexual intercourse for 24 hours before the test.   Do not use vaginal creams or tampons for 24 hours before the test.   Empty your bladder just before the test to lessen any discomfort.  PROCEDURE You will lie on an exam table with your feet in stirrups. A warm metal or plastic instrument (speculum) is placed in your vagina. This instrument allows your caregiver to see the inside of your vagina and look at your cervix. A small, plastic brush or wooden spatula is then used to collect cervical cells. These cells are placed in a lab specimen container. The cells are looked at under a microscope. A specialist will determine if the cells are normal.  AFTER THE PROCEDURE Make sure to get your test results.If your results come back abnormal, you may need further testing.  Document Released: 07/07/2002 Document Revised: 07/09/2011 Document Reviewed: 04/12/2011 [facility] Patient Information 2014 [place], [place].

## 2013-05-26 ENCOUNTER — Telehealth: Payer: Self-pay

## 2013-05-26 NOTE — Telephone Encounter (Signed)
Called pt. And informed her of negative colpo results and the need to f/u for PAP with HPV in a year. Informed pt. To call two months before she is due, around November/December 2015 to schedule the follow up. Pt. Verbalized understanding and gratitude and had no further questions or concerns.

## 2013-05-26 NOTE — Telephone Encounter (Signed)
Message copied by Geanie Logan on Tue May 26, 2013 10:21 AM ------      Message from: Lavonia Drafts      Created: Tue May 26, 2013  9:49 AM       Please call pt.  Her bx from her colpo was negative. She needs to f/u in 1 year for PAP with HPV.            Thx,      clh-S ------

## 2013-05-28 ENCOUNTER — Encounter: Payer: Self-pay | Admitting: *Deleted

## 2013-10-12 ENCOUNTER — Emergency Department (INDEPENDENT_AMBULATORY_CARE_PROVIDER_SITE_OTHER): Payer: Medicaid Other

## 2013-10-12 ENCOUNTER — Encounter (HOSPITAL_COMMUNITY): Payer: Self-pay | Admitting: Emergency Medicine

## 2013-10-12 ENCOUNTER — Emergency Department (HOSPITAL_COMMUNITY)
Admission: EM | Admit: 2013-10-12 | Discharge: 2013-10-12 | Disposition: A | Discharge status: Home / Self Care | Payer: Medicaid Other | Source: Home / Self Care | Attending: Emergency Medicine | Admitting: Emergency Medicine

## 2013-10-12 DIAGNOSIS — S0003XA Contusion of scalp, initial encounter: Secondary | ICD-10-CM

## 2013-10-12 DIAGNOSIS — S1093XA Contusion of unspecified part of neck, initial encounter: Secondary | ICD-10-CM

## 2013-10-12 DIAGNOSIS — S060X9A Concussion with loss of consciousness of unspecified duration, initial encounter: Secondary | ICD-10-CM

## 2013-10-12 DIAGNOSIS — S060XAA Concussion with loss of consciousness status unknown, initial encounter: Secondary | ICD-10-CM

## 2013-10-12 DIAGNOSIS — S161XXA Strain of muscle, fascia and tendon at neck level, initial encounter: Secondary | ICD-10-CM

## 2013-10-12 DIAGNOSIS — Y9279 Other farm location as the place of occurrence of the external cause: Secondary | ICD-10-CM

## 2013-10-12 DIAGNOSIS — S139XXA Sprain of joints and ligaments of unspecified parts of neck, initial encounter: Secondary | ICD-10-CM

## 2013-10-12 DIAGNOSIS — S0083XA Contusion of other part of head, initial encounter: Secondary | ICD-10-CM

## 2013-10-12 MED ORDER — MELOXICAM 15 MG PO TABS: 15.0000 mg | ORAL_TABLET | Freq: Every day | ORAL | Status: DC

## 2013-10-12 MED ORDER — METHOCARBAMOL 500 MG PO TABS: 500.0000 mg | ORAL_TABLET | Freq: Three times a day (TID) | ORAL | Status: DC

## 2013-10-12 NOTE — Discharge Instructions (Signed)
TREATMENT  °Treatment initially involves the use of ice and medication to help reduce pain and inflammation. It is also important to perform strengthening and stretching exercises and modify activities that worsen symptoms so the injury does not get worse. These exercises may be performed at home or with a therapist. For patients who experience severe symptoms, a soft padded collar may be recommended to be worn around the neck.  °Improving your posture may help reduce symptoms. Posture improvement includes pulling your chin and abdomen in while sitting or standing. If you are sitting, sit in a firm chair with your buttocks against the back of the chair. While sleeping, try replacing your pillow with a small towel rolled to 2 inches in diameter, or use a cervical pillow. Poor sleeping positions delay healing.  ° °MEDICATION  °· If pain medication is necessary, nonsteroidal anti-inflammatory medications, such as aspirin and ibuprofen, or other minor pain relievers, such as acetaminophen, are often recommended. °· Do not take pain medication for 7 days before surgery. °· Prescription pain relievers may be given if deemed necessary by your caregiver. Use only as directed and only as much as you need. ° °HEAT AND COLD:  °· Cold treatment (icing) relieves pain and reduces inflammation. Cold treatment should be applied for 10 to 15 minutes every 2 to 3 hours for inflammation and pain and immediately after any activity that aggravates your symptoms. Use ice packs or an ice massage. °· Heat treatment may be used prior to performing the stretching and strengthening activities prescribed by your caregiver, physical therapist, or athletic trainer. Use a heat pack or a warm soak. ° °SEEK MEDICAL CARE IF:  °· Symptoms get worse or do not improve in 2 weeks despite treatment. °· New, unexplained symptoms develop (drugs used in treatment may produce side effects). ° °EXERCISES °RANGE OF MOTION (ROM) AND STRETCHING EXERCISES -  Cervical Strain and Sprain °These exercises may help you when beginning to rehabilitate your injury. In order to successfully resolve your symptoms, you must improve your posture. These exercises are designed to help reduce the forward-head and rounded-shoulder posture which contributes to this condition. Your symptoms may resolve with or without further involvement from your physician, physical therapist or athletic trainer. While completing these exercises, remember:  °· Restoring tissue flexibility helps normal motion to return to the joints. This allows healthier, less painful movement and activity. °· An effective stretch should be held for at least 20 seconds, although you may need to begin with shorter hold times for comfort. °· A stretch should never be painful. You should only feel a gentle lengthening or release in the stretched tissue. ° °STRETCH- Axial Extensors °· Lie on your back on the floor. You may bend your knees for comfort. Place a rolled up hand towel or dish towel, about 2 inches in diameter, under the part of your head that makes contact with the floor. °· Gently tuck your chin, as if trying to make a "double chin," until you feel a gentle stretch at the base of your head. °· Hold _____10_____ seconds. °Repeat _____10_____ times. Complete this exercise _____2_____ times per day.  ° °STRETECH - Axial Extension  °· Stand or sit on a firm surface. Assume a good posture: chest up, shoulders drawn back, abdominal muscles slightly tense, knees unlocked (if standing) and feet hip width apart. °· Slowly retract your chin so your head slides back and your chin slightly lowers.Continue to look straight ahead. °· You should feel a gentle stretch   in the back of your head. Be certain not to feel an aggressive stretch since this can cause headaches later. °· Hold for ____10______ seconds. °Repeat _____10_____ times. Complete this exercise ____2______ times per day. ° °STRETCH  Cervical Side Bend  °· Stand  or sit on a firm surface. Assume a good posture: chest up, shoulders drawn back, abdominal muscles slightly tense, knees unlocked (if standing) and feet hip width apart. °· Without letting your nose or shoulders move, slowly tip your right / left ear to your shoulder until your feel a gentle stretch in the muscles on the opposite side of your neck. °· Hold _____10_____ seconds. °Repeat _____10_____ times. Complete this exercise _____2_____ times per day. ° °STRETCH  Cervical Rotators  °· Stand or sit on a firm surface. Assume a good posture: chest up, shoulders drawn back, abdominal muscles slightly tense, knees unlocked (if standing) and feet hip width apart. °· Keeping your eyes level with the ground, slowly turn your head until you feel a gentle stretch along the back and opposite side of your neck. °· Hold _____10_____ seconds. °Repeat ____10______ times. Complete this exercise ____2______ times per day. ° °RANGE OF MOTION - Neck Circles  °· Stand or sit on a firm surface. Assume a good posture: chest up, shoulders drawn back, abdominal muscles slightly tense, knees unlocked (if standing) and feet hip width apart. °· Gently roll your head down and around from the back of one shoulder to the back of the other. The motion should never be forced or painful. °· Repeat the motion 10-20 times, or until you feel the neck muscles relax and loosen. °Repeat ____10______ times. Complete the exercise _____2_____ times per day. ° °STRENGTHENING EXERCISES - Cervical Strain and Sprain °These exercises may help you when beginning to rehabilitate your injury. They may resolve your symptoms with or without further involvement from your physician, physical therapist or athletic trainer. While completing these exercises, remember:  °· Muscles can gain both the endurance and the strength needed for everyday activities through controlled exercises. °· Complete these exercises as instructed by your physician, physical therapist or  athletic trainer. Progress the resistance and repetitions only as guided. °· You may experience muscle soreness or fatigue, but the pain or discomfort you are trying to eliminate should never worsen during these exercises. If this pain does worsen, stop and make certain you are following the directions exactly. If the pain is still present after adjustments, discontinue the exercise until you can discuss the trouble with your clinician. ° °STRENGTH Cervical Flexors, Isometric °· Face a wall, standing about 6 inches away. Place a small pillow, a ball about 6-8 inches in diameter, or a folded towel between your forehead and the wall. °· Slightly tuck your chin and gently push your forehead into the soft object. Push only with mild to moderate intensity, building up tension gradually. Keep your jaw and forehead relaxed. °· Hold 10 to 20 seconds. Keep your breathing relaxed. °· Release the tension slowly. Relax your neck muscles completely before you start the next repetition. °Repeat _____10_____ times. Complete this exercise _____2_____ times per day. ° °STRENGTH- Cervical Lateral Flexors, Isometric  °· Stand about 6 inches away from a wall. Place a small pillow, a ball about 6-8 inches in diameter, or a folded towel between the side of your head and the wall. °· Slightly tuck your chin and gently tilt your head into the soft object. Push only with mild to moderate intensity, building up tension gradually. Keep   your jaw and forehead relaxed. °· Hold 10 to 20 seconds. Keep your breathing relaxed. °· Release the tension slowly. Relax your neck muscles completely before you start the next repetition. °Repeat _____10_____ times. Complete this exercise ____2______ times per day. ° °STRENGTH  Cervical Extensors, Isometric  °· Stand about 6 inches away from a wall. Place a small pillow, a ball about 6-8 inches in diameter, or a folded towel between the back of your head and the wall. °· Slightly tuck your chin and gently  tilt your head back into the soft object. Push only with mild to moderate intensity, building up tension gradually. Keep your jaw and forehead relaxed. °· Hold 10 to 20 seconds. Keep your breathing relaxed. °· Release the tension slowly. Relax your neck muscles completely before you start the next repetition. °Repeat _____10_____ times. Complete this exercise _____2_____ times per day. ° °POSTURE AND BODY MECHANICS CONSIDERATIONS - Cervical Strain and Sprain °Keeping correct posture when sitting, standing or completing your activities will reduce the stress put on different body tissues, allowing injured tissues a chance to heal and limiting painful experiences. The following are general guidelines for improved posture. Your physician or physical therapist will provide you with any instructions specific to your needs. While reading these guidelines, remember: °· The exercises prescribed by your provider will help you have the flexibility and strength to maintain correct postures. °· The correct posture provides the optimal environment for your joints to work. All of your joints have less wear and tear when properly supported by a spine with good posture. This means you will experience a healthier, less painful body. °· Correct posture must be practiced with all of your activities, especially prolonged sitting and standing. Correct posture is as important when doing repetitive low-stress activities (typing) as it is when doing a single heavy-load activity (lifting). °PROLONGED STANDING WHILE SLIGHTLY LEANING FORWARD °When completing a task that requires you to lean forward while standing in one place for a long time, place either foot up on a stationary 2-4 inch high object to help maintain the best posture. When both feet are on the ground, the low back tends to lose its slight inward curve. If this curve flattens (or becomes too large), then the back and your other joints will experience too much stress, fatigue  more quickly and can cause pain.  °RESTING POSITIONS °Consider which positions are most painful for you when choosing a resting position. If you have pain with flexion-based activities (sitting, bending, stooping, squatting), choose a position that allows you to rest in a less flexed posture. You would want to avoid curling into a fetal position on your side. If your pain worsens with extension-based activities (prolonged standing, working overhead), avoid resting in an extended position such as sleeping on your stomach. Most people will find more comfort when they rest with their spine in a more neutral position, neither too rounded nor too arched. Lying on a non-sagging bed on your side with a pillow between your knees, or on your back with a pillow under your knees will often provide some relief. Keep in mind, being in any one position for a prolonged period of time, no matter how correct your posture, can still lead to stiffness. °WALKING °Walk with an upright posture. Your ears, shoulders and hips should all line-up. °OFFICE WORK °When working at a desk, create an environment that supports good, upright posture. Without extra support, muscles fatigue and lead to excessive strain on joints and other tissues. °  CHAIR: °· A chair should be able to slide under your desk when your back makes contact with the back of the chair. This allows you to work closely. °· The chair's height should allow your eyes to be level with the upper part of your monitor and your hands to be slightly lower than your elbows. °· Body position: °· Your feet should make contact with the floor. If this is not possible, use a foot rest. °· Keep your ears over your shoulders. This will reduce stress on your neck and low back. °Document Released: 04/16/2005 Document Revised: 07/09/2011 Document Reviewed: 07/29/2008 °[employer]® Patient Information ©2013 [employer]. ° ° °
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
## 2014-02-12 ENCOUNTER — Other Ambulatory Visit: Payer: Self-pay

## 2014-03-01 ENCOUNTER — Encounter (HOSPITAL_COMMUNITY): Payer: Self-pay | Admitting: Emergency Medicine

## 2014-07-21 ENCOUNTER — Telehealth (HOSPITAL_COMMUNITY): Payer: Self-pay | Admitting: *Deleted

## 2014-07-21 NOTE — Telephone Encounter (Signed)
Patient called stating came to free pap smear screening on 07/19/2014. Patient is having vaginal itching and irregular periods. Patient states was seen at the [facility] and treated for yeast previously. Advised patient to call the health department and see if would call in prescription since symptoms are still there. Also, going to wait on results of pap smear. If pap smear is abnormal, patient will come to our [place] clinic. Patient will wait for results and then be advised of follow up. Patient voiced understanding.

## 2015-05-01 NOTE — L&D Delivery Note (Signed)
Delivery Note At 5:35 PM a viable female was delivered via Vaginal, Spontaneous Delivery (Presentation: ;  ).  APGAR: 9, 9; weight 8 lb 0.9 oz (3655 g).   Placenta status: , .  Cord:  with the following complications: .  Cord pH: na  Anesthesia:  None Episiotomy: None Lacerations: None Suture Repair: none needed Est. Blood Loss (mL): 300  Mom to postpartum.  Baby to Couplet care / Skin to Skin  Mom delivered while showering in the tub.the nurse was able to deliver the baby and I delivered the placenta  [place] A 12/12/2015, 12:52 PM

## 2015-05-03 LAB — OB RESULTS CONSOLE HIV ANTIBODY (ROUTINE TESTING): HIV: NONREACTIVE

## 2015-05-03 LAB — OB RESULTS CONSOLE GC/CHLAMYDIA
Chlamydia: NEGATIVE
Gonorrhea: NEGATIVE

## 2015-05-03 LAB — OB RESULTS CONSOLE ANTIBODY SCREEN: Antibody Screen: NEGATIVE

## 2015-05-03 LAB — OB RESULTS CONSOLE ABO/RH: RH Type: POSITIVE

## 2015-05-03 LAB — OB RESULTS CONSOLE RUBELLA ANTIBODY, IGM: Rubella: IMMUNE

## 2015-05-03 LAB — OB RESULTS CONSOLE HEPATITIS B SURFACE ANTIGEN: Hepatitis B Surface Ag: NEGATIVE

## 2015-05-03 LAB — OB RESULTS CONSOLE RPR: RPR: NONREACTIVE

## 2015-11-14 LAB — OB RESULTS CONSOLE GBS: GBS: POSITIVE

## 2015-12-04 ENCOUNTER — Encounter (HOSPITAL_COMMUNITY): Payer: Self-pay

## 2015-12-04 ENCOUNTER — Inpatient Hospital Stay (HOSPITAL_COMMUNITY)
Admission: AD | Admit: 2015-12-04 | Discharge: 2015-12-04 | Disposition: A | Discharge status: Home / Self Care | Payer: Medicaid Other | Source: Ambulatory Visit | Attending: Obstetrics & Gynecology | Admitting: Obstetrics & Gynecology

## 2015-12-04 DIAGNOSIS — Z3483 Encounter for supervision of other normal pregnancy, third trimester: Secondary | ICD-10-CM | POA: Insufficient documentation

## 2015-12-04 DIAGNOSIS — Z3A39 39 weeks gestation of pregnancy: Secondary | ICD-10-CM | POA: Diagnosis not present

## 2015-12-04 LAB — AMNISURE RUPTURE OF MEMBRANE (ROM) NOT AT ARMC: Amnisure ROM: NEGATIVE

## 2015-12-04 NOTE — Discharge Instructions (Signed)
Braxton Hicks Contractions °Contractions of the uterus can occur throughout pregnancy. Contractions are not always a sign that you are in labor.  °WHAT ARE BRAXTON HICKS CONTRACTIONS?  °Contractions that occur before labor are called Braxton Hicks contractions, or false labor. Toward the end of pregnancy (32-34 weeks), these contractions can develop more often and may become more forceful. This is not true labor because these contractions do not result in opening (dilatation) and thinning of the cervix. They are sometimes difficult to tell apart from true labor because these contractions can be forceful and people have different pain tolerances. You should not feel embarrassed if you go to the hospital with false labor. Sometimes, the only way to tell if you are in true labor is for your health care provider to look for changes in the cervix. °If there are no prenatal problems or other health problems associated with the pregnancy, it is completely safe to be sent home with false labor and await the onset of true labor. °HOW CAN YOU TELL THE DIFFERENCE BETWEEN TRUE AND FALSE LABOR? °False Labor °· The contractions of false labor are usually shorter and not as hard as those of true labor.   °· The contractions are usually irregular.   °· The contractions are often felt in the front of the lower abdomen and in the groin.   °· The contractions may go away when you walk around or change positions while lying down.   °· The contractions get weaker and are shorter lasting as time goes on.   °· The contractions do not usually become progressively stronger, regular, and closer together as with true labor.   °True Labor °· Contractions in true labor last 30-70 seconds, become very regular, usually become more intense, and increase in frequency.   °· The contractions do not go away with walking.   °· The discomfort is usually felt in the top of the uterus and spreads to the lower abdomen and low back.   °· True labor can be  determined by your health care provider with an exam. This will show that the cervix is dilating and getting thinner.   °WHAT TO REMEMBER °· Keep up with your usual exercises and follow other instructions given by your health care provider.   °· Take medicines as directed by your health care provider.   °· Keep your regular prenatal appointments.   °· Eat and drink lightly if you think you are going into labor.   °· If Braxton Hicks contractions are making you uncomfortable:   °¨ Change your position from lying down or resting to walking, or from walking to resting.   °¨ Sit and rest in a tub of warm water.   °¨ Drink 2-3 glasses of water. Dehydration may cause these contractions.   °¨ Do slow and deep breathing several times an hour.   °WHEN SHOULD I SEEK IMMEDIATE MEDICAL CARE? °Seek immediate medical care if: °· Your contractions become stronger, more regular, and closer together.   °· You have fluid leaking or gushing from your vagina.   °· You have a fever.   °· You pass blood-tinged mucus.   °· You have vaginal bleeding.   °· You have continuous abdominal pain.   °· You have low back pain that you never had before.   °· You feel your baby's head pushing down and causing pelvic pressure.   °· Your baby is not moving as much as it used to.   °  °This information is not intended to replace advice given to you by your health care provider. Make sure you discuss any questions you have with your health care   provider.   Document Released: 04/16/2005 Document Revised: 04/21/2013 Document Reviewed: 01/26/2013 Elsevier Interactive Patient Education 2016 [place].   Fetal Movement Counts Patient Name: __________________________________________________ Patient Due Date: ____________________ Performing a fetal movement count is highly recommended in high-risk pregnancies, but it is good for every pregnant woman to do. Your health care provider may ask you to start counting fetal movements at 28 weeks of the  pregnancy. Fetal movements often increase:  After eating a full meal.  After physical activity.  After eating or drinking something sweet or cold.  At rest. Pay attention to when you feel the baby is most active. This will help you notice a pattern of your baby's sleep and wake cycles and what factors contribute to an increase in fetal movement. It is important to perform a fetal movement count at the same time each day when your baby is normally most active.  HOW TO COUNT FETAL MOVEMENTS 1. Find a quiet and comfortable area to sit or lie down on your left side. Lying on your left side provides the best blood and oxygen circulation to your baby. 2. Write down the day and time on a sheet of paper or in a journal. 3. Start counting kicks, flutters, swishes, rolls, or jabs in a 2-hour period. You should feel at least 10 movements within 2 hours. 4. If you do not feel 10 movements in 2 hours, wait 2-3 hours and count again. Look for a change in the pattern or not enough counts in 2 hours. SEEK MEDICAL CARE IF:  You feel less than 10 counts in 2 hours, tried twice.  There is no movement in over an hour.  The pattern is changing or taking longer each day to reach 10 counts in 2 hours.  You feel the baby is not moving as he or she usually does. Date: ____________ Movements: ____________ Start time: ____________ Elizebeth Koller time: ____________  Date: ____________ Movements: ____________ Start time: ____________ Elizebeth Koller time: ____________ Date: ____________ Movements: ____________ Start time: ____________ Elizebeth Koller time: ____________ Date: ____________ Movements: ____________ Start time: ____________ Elizebeth Koller time: ____________ Date: ____________ Movements: ____________ Start time: ____________ Elizebeth Koller time: ____________ Date: ____________ Movements: ____________ Start time: ____________ Elizebeth Koller time: ____________ Date: ____________ Movements: ____________ Start time: ____________ Elizebeth Koller time:  ____________ Date: ____________ Movements: ____________ Start time: ____________ Elizebeth Koller time: ____________  Date: ____________ Movements: ____________ Start time: ____________ Elizebeth Koller time: ____________ Date: ____________ Movements: ____________ Start time: ____________ Elizebeth Koller time: ____________ Date: ____________ Movements: ____________ Start time: ____________ Elizebeth Koller time: ____________ Date: ____________ Movements: ____________ Start time: ____________ Elizebeth Koller time: ____________ Date: ____________ Movements: ____________ Start time: ____________ Elizebeth Koller time: ____________ Date: ____________ Movements: ____________ Start time: ____________ Elizebeth Koller time: ____________ Date: ____________ Movements: ____________ Start time: ____________ Elizebeth Koller time: ____________  Date: ____________ Movements: ____________ Start time: ____________ Elizebeth Koller time: ____________ Date: ____________ Movements: ____________ Start time: ____________ Elizebeth Koller time: ____________ Date: ____________ Movements: ____________ Start time: ____________ Elizebeth Koller time: ____________ Date: ____________ Movements: ____________ Start time: ____________ Elizebeth Koller time: ____________ Date: ____________ Movements: ____________ Start time: ____________ Elizebeth Koller time: ____________ Date: ____________ Movements: ____________ Start time: ____________ Elizebeth Koller time: ____________ Date: ____________ Movements: ____________ Start time: ____________ Elizebeth Koller time: ____________  Date: ____________ Movements: ____________ Start time: ____________ Elizebeth Koller time: ____________ Date: ____________ Movements: ____________ Start time: ____________ Elizebeth Koller time: ____________ Date: ____________ Movements: ____________ Start time: ____________ Elizebeth Koller time: ____________ Date: ____________ Movements: ____________ Start time: ____________ Elizebeth Koller time: ____________ Date: ____________ Movements: ____________ Start time: ____________ Elizebeth Koller time: ____________ Date: ____________ Movements:  ____________ Start time: ____________  Finish time: ____________ Date: ____________ Movements: ____________ Start time: ____________ Elizebeth Koller time: ____________  Date: ____________ Movements: ____________ Start time: ____________ Elizebeth Koller time: ____________ Date: ____________ Movements: ____________ Start time: ____________ Elizebeth Koller time: ____________ Date: ____________ Movements: ____________ Start time: ____________ Elizebeth Koller time: ____________ Date: ____________ Movements: ____________ Start time: ____________ Elizebeth Koller time: ____________ Date: ____________ Movements: ____________ Start time: ____________ Elizebeth Koller time: ____________ Date: ____________ Movements: ____________ Start time: ____________ Elizebeth Koller time: ____________ Date: ____________ Movements: ____________ Start time: ____________ Elizebeth Koller time: ____________  Date: ____________ Movements: ____________ Start time: ____________ Elizebeth Koller time: ____________ Date: ____________ Movements: ____________ Start time: ____________ Elizebeth Koller time: ____________ Date: ____________ Movements: ____________ Start time: ____________ Elizebeth Koller time: ____________ Date: ____________ Movements: ____________ Start time: ____________ Elizebeth Koller time: ____________ Date: ____________ Movements: ____________ Start time: ____________ Elizebeth Koller time: ____________ Date: ____________ Movements: ____________ Start time: ____________ Elizebeth Koller time: ____________ Date: ____________ Movements: ____________ Start time: ____________ Elizebeth Koller time: ____________  Date: ____________ Movements: ____________ Start time: ____________ Elizebeth Koller time: ____________ Date: ____________ Movements: ____________ Start time: ____________ Elizebeth Koller time: ____________ Date: ____________ Movements: ____________ Start time: ____________ Elizebeth Koller time: ____________ Date: ____________ Movements: ____________ Start time: ____________ Elizebeth Koller time: ____________ Date: ____________ Movements: ____________ Start time: ____________ Elizebeth Koller  time: ____________ Date: ____________ Movements: ____________ Start time: ____________ Elizebeth Koller time: ____________ Date: ____________ Movements: ____________ Start time: ____________ Elizebeth Koller time: ____________  Date: ____________ Movements: ____________ Start time: ____________ Elizebeth Koller time: ____________ Date: ____________ Movements: ____________ Start time: ____________ Elizebeth Koller time: ____________ Date: ____________ Movements: ____________ Start time: ____________ Elizebeth Koller time: ____________ Date: ____________ Movements: ____________ Start time: ____________ Elizebeth Koller time: ____________ Date: ____________ Movements: ____________ Start time: ____________ Elizebeth Koller time: ____________ Date: ____________ Movements: ____________ Start time: ____________ Elizebeth Koller time: ____________   This information is not intended to replace advice given to you by your health care provider. Make sure you discuss any questions you have with your health care provider.   Document Released: 05/16/2006 Document Revised: 05/07/2014 Document Reviewed: 02/11/2012 Elsevier Interactive Patient Education [employer].

## 2015-12-04 NOTE — MAU Note (Signed)
Patient presents with ? Water broken, unsure if she urinated on herself.

## 2015-12-04 NOTE — MAU Note (Signed)
Urine in lab 

## 2015-12-07 ENCOUNTER — Encounter (HOSPITAL_COMMUNITY): Payer: Self-pay | Admitting: *Deleted

## 2015-12-07 ENCOUNTER — Inpatient Hospital Stay (HOSPITAL_COMMUNITY)
Admission: AD | Admit: 2015-12-07 | Discharge: 2015-12-09 | DRG: 775 | Disposition: A | Discharge status: Home / Self Care | Payer: Medicaid Other | Source: Ambulatory Visit | Attending: Obstetrics and Gynecology | Admitting: Obstetrics and Gynecology

## 2015-12-07 DIAGNOSIS — Z3A39 39 weeks gestation of pregnancy: Secondary | ICD-10-CM

## 2015-12-07 DIAGNOSIS — Z825 Family history of asthma and other chronic lower respiratory diseases: Secondary | ICD-10-CM

## 2015-12-07 DIAGNOSIS — Z8249 Family history of ischemic heart disease and other diseases of the circulatory system: Secondary | ICD-10-CM | POA: Diagnosis not present

## 2015-12-07 DIAGNOSIS — D649 Anemia, unspecified: Secondary | ICD-10-CM | POA: Diagnosis present

## 2015-12-07 DIAGNOSIS — Z3483 Encounter for supervision of other normal pregnancy, third trimester: Secondary | ICD-10-CM | POA: Diagnosis present

## 2015-12-07 DIAGNOSIS — O9902 Anemia complicating childbirth: Secondary | ICD-10-CM | POA: Diagnosis present

## 2015-12-07 DIAGNOSIS — O99824 Streptococcus B carrier state complicating childbirth: Principal | ICD-10-CM | POA: Diagnosis present

## 2015-12-07 DIAGNOSIS — Z811 Family history of alcohol abuse and dependence: Secondary | ICD-10-CM

## 2015-12-07 LAB — CBC
HCT: 35.7 % — ABNORMAL LOW (ref 36.0–46.0)
Hemoglobin: 12.4 g/dL (ref 12.0–15.0)
MCH: 30.5 pg (ref 26.0–34.0)
MCHC: 34.7 g/dL (ref 30.0–36.0)
MCV: 87.7 fL (ref 78.0–100.0)
Platelets: 150 10*3/uL (ref 150–400)
RBC: 4.07 MIL/uL (ref 3.87–5.11)
RDW: 13.8 % (ref 11.5–15.5)
WBC: 9.2 10*3/uL (ref 4.0–10.5)

## 2015-12-07 LAB — TYPE AND SCREEN
ABO/RH(D): A POS
Antibody Screen: NEGATIVE

## 2015-12-07 MED ORDER — SOD CITRATE-CITRIC ACID 500-334 MG/5ML PO SOLN: 30.0000 mL | ORAL | Status: DC | PRN

## 2015-12-07 MED ORDER — LACTATED RINGERS IV SOLN
INTRAVENOUS | Status: DC
  Administered 2015-12-07: 17:00:00 via INTRAVENOUS

## 2015-12-07 MED ORDER — OXYCODONE-ACETAMINOPHEN 5-325 MG PO TABS
1.0000 | ORAL_TABLET | ORAL | Status: DC | PRN
  Administered 2015-12-08 (×2): 1 via ORAL
  Filled 2015-12-07 (×2): qty 1

## 2015-12-07 MED ORDER — ONDANSETRON HCL 4 MG PO TABS: 4.0000 mg | ORAL_TABLET | ORAL | Status: DC | PRN

## 2015-12-07 MED ORDER — ONDANSETRON HCL 4 MG/2ML IJ SOLN: 4.0000 mg | INTRAMUSCULAR | Status: DC | PRN

## 2015-12-07 MED ORDER — ACETAMINOPHEN 325 MG PO TABS
650.0000 mg | ORAL_TABLET | ORAL | Status: DC | PRN
  Administered 2015-12-07: 650 mg via ORAL
  Filled 2015-12-07: qty 2

## 2015-12-07 MED ORDER — OXYTOCIN 40 UNITS IN LACTATED RINGERS INFUSION - SIMPLE MED
2.5000 [IU]/h | INTRAVENOUS | Status: DC
  Filled 2015-12-07: qty 1000

## 2015-12-07 MED ORDER — PENICILLIN G POTASSIUM 5000000 UNITS IJ SOLR
2.5000 10*6.[IU] | INTRAVENOUS | Status: DC
  Filled 2015-12-07 (×2): qty 2.5

## 2015-12-07 MED ORDER — SODIUM CHLORIDE 0.9% FLUSH: 3.0000 mL | Freq: Two times a day (BID) | INTRAVENOUS | Status: DC

## 2015-12-07 MED ORDER — LIDOCAINE HCL (PF) 1 % IJ SOLN
30.0000 mL | INTRAMUSCULAR | Status: DC | PRN
  Filled 2015-12-07: qty 30

## 2015-12-07 MED ORDER — OXYCODONE-ACETAMINOPHEN 5-325 MG PO TABS
2.0000 | ORAL_TABLET | ORAL | Status: DC | PRN
  Administered 2015-12-08 (×2): 2 via ORAL
  Filled 2015-12-07 (×2): qty 2

## 2015-12-07 MED ORDER — BENZOCAINE-MENTHOL 20-0.5 % EX AERO
1.0000 "application " | INHALATION_SPRAY | CUTANEOUS | Status: DC | PRN
  Filled 2015-12-07: qty 56

## 2015-12-07 MED ORDER — LACTATED RINGERS IV SOLN: 500.0000 mL | INTRAVENOUS | Status: DC | PRN

## 2015-12-07 MED ORDER — SODIUM CHLORIDE 0.9 % IV SOLN: 2.0000 g | INTRAVENOUS | Status: DC

## 2015-12-07 MED ORDER — COCONUT OIL OIL: 1.0000 "application " | TOPICAL_OIL | Status: DC | PRN

## 2015-12-07 MED ORDER — SENNOSIDES-DOCUSATE SODIUM 8.6-50 MG PO TABS
2.0000 | ORAL_TABLET | ORAL | Status: DC
  Administered 2015-12-07 – 2015-12-09 (×2): 2 via ORAL
  Filled 2015-12-07 (×2): qty 2

## 2015-12-07 MED ORDER — TETANUS-DIPHTH-ACELL PERTUSSIS 5-2.5-18.5 LF-MCG/0.5 IM SUSP: 0.5000 mL | Freq: Once | INTRAMUSCULAR | Status: DC

## 2015-12-07 MED ORDER — FLEET ENEMA 7-19 GM/118ML RE ENEM: 1.0000 | ENEMA | RECTAL | Status: DC | PRN

## 2015-12-07 MED ORDER — ZOLPIDEM TARTRATE 5 MG PO TABS: 5.0000 mg | ORAL_TABLET | Freq: Every evening | ORAL | Status: DC | PRN

## 2015-12-07 MED ORDER — ONDANSETRON HCL 4 MG/2ML IJ SOLN: 4.0000 mg | Freq: Four times a day (QID) | INTRAMUSCULAR | Status: DC | PRN

## 2015-12-07 MED ORDER — OXYCODONE-ACETAMINOPHEN 5-325 MG PO TABS: 2.0000 | ORAL_TABLET | ORAL | Status: DC | PRN

## 2015-12-07 MED ORDER — IBUPROFEN 600 MG PO TABS
600.0000 mg | ORAL_TABLET | Freq: Four times a day (QID) | ORAL | Status: DC
  Administered 2015-12-07 – 2015-12-09 (×8): 600 mg via ORAL
  Filled 2015-12-07 (×8): qty 1

## 2015-12-07 MED ORDER — SIMETHICONE 80 MG PO CHEW: 80.0000 mg | CHEWABLE_TABLET | ORAL | Status: DC | PRN

## 2015-12-07 MED ORDER — WITCH HAZEL-GLYCERIN EX PADS: 1.0000 "application " | MEDICATED_PAD | CUTANEOUS | Status: DC | PRN

## 2015-12-07 MED ORDER — DIPHENHYDRAMINE HCL 25 MG PO CAPS: 25.0000 mg | ORAL_CAPSULE | Freq: Four times a day (QID) | ORAL | Status: DC | PRN

## 2015-12-07 MED ORDER — SODIUM CHLORIDE 0.9% FLUSH: 3.0000 mL | INTRAVENOUS | Status: DC | PRN

## 2015-12-07 MED ORDER — OXYCODONE-ACETAMINOPHEN 5-325 MG PO TABS: 1.0000 | ORAL_TABLET | ORAL | Status: DC | PRN

## 2015-12-07 MED ORDER — OXYTOCIN BOLUS FROM INFUSION
500.0000 mL | Freq: Once | INTRAVENOUS | Status: AC
  Administered 2015-12-07: 500 mL via INTRAVENOUS

## 2015-12-07 MED ORDER — FENTANYL CITRATE (PF) 100 MCG/2ML IJ SOLN: 50.0000 ug | INTRAMUSCULAR | Status: DC | PRN

## 2015-12-07 MED ORDER — PENICILLIN G POTASSIUM 5000000 UNITS IJ SOLR
5.0000 10*6.[IU] | Freq: Once | INTRAMUSCULAR | Status: DC
  Filled 2015-12-07: qty 5

## 2015-12-07 MED ORDER — DIBUCAINE 1 % RE OINT: 1.0000 "application " | TOPICAL_OINTMENT | RECTAL | Status: DC | PRN

## 2015-12-07 MED ORDER — PRENATAL MULTIVITAMIN CH
1.0000 | ORAL_TABLET | Freq: Every day | ORAL | Status: DC
  Administered 2015-12-08 – 2015-12-09 (×2): 1 via ORAL
  Filled 2015-12-07 (×2): qty 1

## 2015-12-07 MED ORDER — ACETAMINOPHEN 325 MG PO TABS: 650.0000 mg | ORAL_TABLET | ORAL | Status: DC | PRN

## 2015-12-07 MED ORDER — SODIUM CHLORIDE 0.9 % IV SOLN: 250.0000 mL | INTRAVENOUS | Status: DC | PRN

## 2015-12-07 NOTE — MAU Note (Signed)
Pt presents to MAU with complaints of contractions since last night. Denies any vaginal bleeding or LOF

## 2015-12-07 NOTE — Progress Notes (Signed)
Pt's spouse came out of the room and stated, "She says the baby is coming." Upon entering the room, pt is kneeling in the shower and pushing saying, "the baby is coming." Limmie Patricia, RN kneeled at the bathtub and performed SVE and baby was starting to crown. We considered moving the patient to be unsafe and the baby was delivered in the bathtub with pt on hands and knees. MD was called as soon as patient was checked and was on the unit and in the room within seconds of delivery. MD attempted to deliver placenta in the tub, but not ready to deliver so pt moved to the bed for delivery of the placenta. Patient and baby both wrapped in warm blankets and skin to skin for bonding time.

## 2015-12-08 LAB — CBC
HCT: 33.1 % — ABNORMAL LOW (ref 36.0–46.0)
Hemoglobin: 11.3 g/dL — ABNORMAL LOW (ref 12.0–15.0)
MCH: 30.1 pg (ref 26.0–34.0)
MCHC: 34.1 g/dL (ref 30.0–36.0)
MCV: 88 fL (ref 78.0–100.0)
Platelets: 146 10*3/uL — ABNORMAL LOW (ref 150–400)
RBC: 3.76 MIL/uL — ABNORMAL LOW (ref 3.87–5.11)
RDW: 14 % (ref 11.5–15.5)
WBC: 11.5 10*3/uL — ABNORMAL HIGH (ref 4.0–10.5)

## 2015-12-08 LAB — URINALYSIS, DIPSTICK ONLY
Bilirubin Urine: NEGATIVE
Glucose, UA: NEGATIVE mg/dL
Ketones, ur: NEGATIVE mg/dL
Leukocytes, UA: NEGATIVE
Nitrite: NEGATIVE
Protein, ur: NEGATIVE mg/dL
Specific Gravity, Urine: 1.01 (ref 1.005–1.030)
pH: 6 (ref 5.0–8.0)

## 2015-12-08 LAB — RPR: RPR Ser Ql: NONREACTIVE

## 2015-12-08 NOTE — Lactation Note (Signed)
This note was copied from a baby's chart. Lactation Consultation Note  Patient Name: Michelle Page Date: 12/08/2015 Reason for consult: Follow-up assessment Baby at 27 hr of life. Mom was sleeping. Instructed person in the room to have mom call at the next bf.   Maternal Data    Feeding Feeding Type: Breast Fed Length of feed: 20 min  LATCH Score/Interventions                      Lactation Tools Discussed/Used     Consult Status Consult Status: Follow-up Date: 12/09/15 Follow-up type: In-patient    Denzil Hughes 12/08/2015, 8:42 PM

## 2015-12-08 NOTE — Progress Notes (Signed)
MOB was referred for history of depression/anxiety. * Referral screened out by Clinical Social Worker because none of the following criteria appear to apply: ~ History of anxiety/depression during this pregnancy, or of post-partum depression. ~ Diagnosis of anxiety and/or depression within last 3 years OR * MOB's symptoms currently being treated with medication and/or therapy. Please contact the Clinical Social Worker if needs arise, or if MOB requests.  See CSW note dated 04/09/2012.  No other concerns noted for this pregnancy.   Laurey Arrow, MSW, LCSW Clinical Social Work [phone]

## 2015-12-08 NOTE — Progress Notes (Addendum)
Kojima, Chinaza M Female, 40 y.o., 11/25/1975   Post Partum Day 1 Subjective: Pt.  complaining of burning sensation with urination, she did not have any tears at delivery.      Objective: Blood pressure 105/78, pulse 70, temperature 97.6 F (36.4 C), temperature source Oral, resp. rate 20, height 5\' 4"  (1.626 m), weight 100.7 kg (222 lb), unknown if currently breastfeeding.  Physical Exam:  General: alert, cooperative and no distress Lochia: appropriate Uterine Fundus: firm DVT Evaluation: No evidence of DVT seen on physical exam. No significant calf/ankle edema.   Recent Labs  12/07/15 1645 12/08/15 0507  HGB 12.4 11.3*  HCT 35.7* 33.1*    Assessment/Plan: Plan for discharge tomorrow, Breastfeeding and Contraception Unsure. Urinanalysis and culture sent as with urinary complaints.    LOS: 1 day   [facility] 12/08/2015, 10:59 AM

## 2015-12-08 NOTE — Lactation Note (Signed)
This note was copied from a baby's chart. Lactation Consultation Note Experienced BF mom has 7 children. States she BF some of them for 1 yr to 18 months. Has good everted nipples. Mom demonstrated colostrum by hand expression. Mom encouraged to feed baby 8-12 times/24 hours and with feeding cues. Referred to Baby and Me Book in Breastfeeding section Pg. 22-23 for position options and Proper latch demonstration.Mom encouraged to do skin-to-skin.[place] brochure given w/resources, support groups and [place] services. Patient Name: Michelle Page Today's Date: 12/08/2015 Reason for consult: Initial assessment   Maternal Data Has patient been taught Hand Expression?: Yes Does the patient have breastfeeding experience prior to this delivery?: Yes  Feeding Feeding Type: Breast Fed Length of feed: 20 min  LATCH Score/Interventions       Type of Nipple: Everted at rest and after stimulation  Comfort (Breast/Nipple): Soft / non-tender           Lactation Tools Discussed/Used WIC Program: Yes   Consult Status Consult Status: Follow-up Date: 12/09/15 Follow-up type: In-patient    Theodoro Kalata 12/08/2015, 7:38 AM

## 2015-12-08 NOTE — Progress Notes (Signed)
UR chart review completed.  

## 2015-12-09 LAB — CULTURE, OB URINE
Culture: NO GROWTH
Special Requests: NORMAL

## 2015-12-09 MED ORDER — IBUPROFEN 800 MG PO TABS: 800.0000 mg | ORAL_TABLET | Freq: Three times a day (TID) | ORAL | 1 refills | Status: DC | PRN

## 2015-12-09 MED ORDER — HYDROMORPHONE HCL 2 MG PO TABS: 2.0000 mg | ORAL_TABLET | ORAL | 0 refills | Status: DC | PRN

## 2015-12-09 MED ORDER — NORETHINDRONE 0.35 MG PO TABS: 1.0000 | ORAL_TABLET | Freq: Every day | ORAL | 11 refills | Status: DC

## 2015-12-09 MED ORDER — IBUPROFEN 600 MG PO TABS: 600.0000 mg | ORAL_TABLET | Freq: Four times a day (QID) | ORAL | 0 refills | Status: DC

## 2015-12-09 NOTE — Discharge Summary (Signed)
Obstetric Discharge Summary  Reason for Admission: onset of labor  Prenatal Procedures: none Intrapartum Procedures: precipitous vaginal delivery  Postpartum Procedures: none Complications-Operative and Postpartum: none  Hemoglobin  Date Value Ref Range Status  12/08/2015 11.3 (L) 12.0 - 15.0 g/dL Final   HCT  Date Value Ref Range Status  12/08/2015 33.1 (L) 36.0 - 46.0 % Final    Discharge Diagnoses: Term Pregnancy-delivered  Physical exam:   General: normal Lochia: appropriate Uterine Fundus: @ U/ firm non-tender  Extremities: No evidence of DVT seen on physical exam. Edema none   Hospital course: uneventful  Date: 12/09/2015 Activity: unrestricted Diet: routine Medications: Ibuprofen Condition: stable  Breastfeeding:   Yes.   Contraception:  minipill  Instructions: refer to practice specific booklet Discharge to: home [facility] Follow up in 6 week(s).   Specialty:  Obstetrics and Gynecology Contact information: [address] [place] [phone]          Newborn Data:   Baby female   Larey Days CNM 12/09/2015, 3:13 AM

## 2015-12-09 NOTE — Discharge Instructions (Signed)

## 2015-12-09 NOTE — Lactation Note (Signed)
This note was copied from a baby's chart. Lactation Consultation Note: Mom asking about reoccuring mastitis. Had it with first baby and has had it once with each consecutive baby in the same spot. Encouraged frequent nursing and making sure that area is emptied well. Can use heat and massage to any lumps in that area. To call OB as soon as she starts noticing any symptoms of mastitis. Reports breasts are feeling fuller this morning and baby has been nursing well. Nipples are a little tender. Encouraged to hand express and rub EBM into nipples. No further questions at present. Reviewed our phone number . To call prn  Patient Name: Michelle Page Date: 12/09/2015     Maternal Data    Feeding Length of feed: 20 min  LATCH Score/Interventions                      Lactation Tools Discussed/Used     Consult Status      Truddie Crumble 12/09/2015, 10:37 AM

## 2015-12-09 NOTE — Discharge Summary (Signed)
[facility] [place] Discharge Summary   Patient Name:   Michelle Page DOB:     [birth year] MRN:     [id number]  Date of Admission:   12/07/2015 Date of Discharge:  12/09/2015  Admitting diagnosis:    39.5w ctx 2-5 min apart Active Problems:   Vaginal delivery [place] multipara    Discharge diagnosis:    39.5w ctx 2-5 min apart Active Problems:   Vaginal delivery  [place] multipara  Anemia                                                                Post partum procedures: None  Type of Delivery:  Precipitous vaginal delivery  Delivering Provider: Crawford Givens   Date of Delivery:  12/07/2015  Newborn Data:    Live born female  Birth Weight: 8 lb 0.9 oz (3655 g) APGAR: 9, 9  Baby's Name:  Michelle Page Baby Feeding:   Breast Disposition:   home with mother  Complications:   None  Hospital course:      Onset of Labor With Vaginal Delivery     40 y.o. yo [id number] at [redacted]w[redacted]d was admitted in Active Labor on 12/07/2015. Patient had an uncomplicated labor course as follows:  Membrane Rupture Time/Date: 5:35 PM ,12/07/2015   Intrapartum Procedures: Episiotomy: None [1]                                         Lacerations:  None [1]  Patient had a delivery of a Viable infant. 12/07/2015  Information for the patient's newborn:  Michelle Page, Michelle Page Michelle Page [id number]  Delivery Method: Vaginal, Spontaneous Delivery (Filed from Delivery Summary)    [name] had an uncomplicated postpartum course.  She is ambulating, tolerating a regular diet, passing flatus, and urinating well. Patient is discharged home in stable condition on 12/09/15.    Physical Exam:  Vitals:   12/07/15 2350 12/08/15 0500 12/08/15 1900 12/09/15 0550  BP: 132/76 105/78 110/67 123/72  Pulse: 75 70 86 78  Resp: 20 20 18 18   Temp: 99 F (37.2 C) 97.6 F (36.4 C) 98.1 F (36.7 C) 97.9 F (36.6 C)  TempSrc: Oral Oral Oral Oral  SpO2:   96%   Weight:      Height:       General: alert and no  distress Lochia: appropriate Uterine Fundus: firm Incision: N/A DVT Evaluation: No evidence of DVT seen on physical exam.  Labs: Lab Results  Component Value Date   WBC 11.5 (H) 12/08/2015   HGB 11.3 (L) 12/08/2015   HCT 33.1 (L) 12/08/2015   MCV 88.0 12/08/2015   PLT 146 (L) 12/08/2015   CMP Latest Ref Rng & Units 01/30/2012  Glucose 70 - 104 mg/dL 71    Discharge instruction: per After Visit Summary and "Baby and Me Booklet".  After Visit Meds:    Medication List    TAKE these medications   acetaminophen 500 MG tablet Commonly known as:  TYLENOL Take 1,000 mg by mouth every 6 (six) hours as needed for moderate pain.   calcium carbonate 750 MG chewable tablet Commonly known as:  TUMS EX Chew 2 tablets by mouth 2 (two)  times daily as needed for heartburn.   FIORICET PO Take 1 tablet by mouth every 6 (six) hours as needed (headache).   Fish Oil 1000 MG Caps Take 1 capsule by mouth daily.   HYDROmorphone 2 MG tablet Commonly known as:  DILAUDID Take 1 tablet (2 mg total) by mouth every 4 (four) hours as needed for severe pain.   ibuprofen 600 MG tablet Commonly known as:  ADVIL,MOTRIN Take 1 tablet (600 mg total) by mouth every 6 (six) hours.   ibuprofen 800 MG tablet Commonly known as:  ADVIL,MOTRIN Take 1 tablet (800 mg total) by mouth every 8 (eight) hours as needed.   norethindrone 0.35 MG tablet Commonly known as:  ORTHO MICRONOR Take 1 tablet (0.35 mg total) by mouth daily.   PNV PO Take 1 tablet by mouth daily.   PROBIOTIC ADVANCED PO Take 2 capsules by mouth daily.       Diet: routine diet  Activity: Advance as tolerated. Pelvic rest for 6 weeks.   Outpatient follow up:6 weeks Follow up Appt:No future appointments. Follow up visit: No Follow-up on file.  Postpartum contraception: Progesterone only pills  12/09/2015 Eli Hose, MD

## 2015-12-12 NOTE — H&P (Signed)
Michelle Page is a 40 y.o. female presenting for labor. History OB History as of 12/09/15    Gravida Para Term Preterm AB Living   7 6 6   1 6    SAB TAB Ectopic Multiple Live Births     1   0 6     Past Medical History:  Diagnosis Date  . Abnormal Pap smear    colpo/leep  . Anemia   . Cancer [facility]) 2006   Skin  . Chlamydia infection   . Depression   . H/O candidiasis   . H/O varicella   . Headache(784.0)    migraines as a child   . Heart murmur   . High risk HPV infection   . Pregnancy induced hypertension    1st & 2nd pregnancy  . Two vessel umbilical cord, antepartum 11/26/2011  . Yeast infection    Past Surgical History:  Procedure Laterality Date  . COLPOSCOPY    . LEEP    . WISDOM TOOTH EXTRACTION     Family History: family history includes Alcohol abuse in her brother and father; COPD in her paternal grandmother; Heart disease in her paternal grandfather. Social History:  reports that she has never smoked. She has never used smokeless tobacco. She reports that she does not drink alcohol or use drugs.  Review of Systems - Negative except  as above  Dilation: 4 Effacement (%): 100 Station: -2 Exam by:: Ginger Morris rn Blood pressure 123/72, pulse 78, temperature 97.9 F (36.6 C), temperature source Oral, resp. rate 18, height 5\' 4"  (1.626 m), weight 222 lb (100.7 kg), SpO2 96 %, unknown if currently breastfeeding. Exam Physical Exam  Pt went into the shower and declined exam Prenatal labs: ABO, Rh: --/--/A POS (08/09 1645) Antibody: NEG (08/09 1645) Rubella: !Error! RPR: Non Reactive (08/09 1645)  HBsAg: Negative (01/03 0000)  HIV: Non-reactive (01/03 0000)  GBS: Positive (07/17 0000)   Assessment/Plan: Active labor Epidural PRN Anticipate SVD   Timmi Devora A 12/12/2015, 1:04 PM

## 2016-10-02 ENCOUNTER — Ambulatory Visit (INDEPENDENT_AMBULATORY_CARE_PROVIDER_SITE_OTHER): Payer: Medicaid Other | Admitting: Orthopaedic Surgery

## 2016-10-02 ENCOUNTER — Ambulatory Visit (INDEPENDENT_AMBULATORY_CARE_PROVIDER_SITE_OTHER): Payer: Medicaid Other

## 2016-10-02 ENCOUNTER — Encounter: Payer: Self-pay | Admitting: Orthopaedic Surgery

## 2016-10-02 VITALS — BP 122/76 | HR 75 | Ht 64.0 in | Wt 201.0 lb

## 2016-10-02 DIAGNOSIS — S92351A Displaced fracture of fifth metatarsal bone, right foot, initial encounter for closed fracture: Secondary | ICD-10-CM

## 2016-10-02 DIAGNOSIS — M25571 Pain in right ankle and joints of right foot: Secondary | ICD-10-CM

## 2016-10-02 NOTE — Progress Notes (Signed)
Subjective:    Patient ID: Michelle Page, female    DOB: 06-22-1975, 41 y.o.   MRN: [id number]  HPI She had someone step on her foot two days ago.  She fell and twisted her ankle and has had pain in the right foot since then.  She has bruising and swelling.  She has used Epson salt soaks and ice and elevation.  She has no other injury.  Her pain is a little better.  She has no cane or crutch.   Review of Systems  HENT: Negative for congestion.   Respiratory: Negative for cough and shortness of breath.   Cardiovascular: Negative for chest pain and leg swelling.  Endocrine: Negative for cold intolerance.  Musculoskeletal: Positive for arthralgias and gait problem.  Allergic/Immunologic: Negative for environmental allergies.   Past Medical History:  Diagnosis Date  . Abnormal Pap smear    colpo/leep  . Anemia   . Cancer [facility]) 2006   Skin  . Chlamydia infection   . Depression   . Fractures   . H/O candidiasis   . H/O varicella   . Headache(784.0)    migraines as a child   . Heart murmur   . High risk HPV infection   . Pregnancy induced hypertension    1st & 2nd pregnancy  . Two vessel umbilical cord, antepartum 11/26/2011  . Yeast infection     Past Surgical History:  Procedure Laterality Date  . COLPOSCOPY    . LEEP    . WISDOM TOOTH EXTRACTION      Current Outpatient Prescriptions on File Prior to Visit  Medication Sig Dispense Refill  . acetaminophen (TYLENOL) 500 MG tablet Take 1,000 mg by mouth every 6 (six) hours as needed for moderate pain.    . Butalbital-APAP-Caffeine (FIORICET PO) Take 1 tablet by mouth every 6 (six) hours as needed (headache).     . calcium carbonate (TUMS EX) 750 MG chewable tablet Chew 2 tablets by mouth 2 (two) times daily as needed for heartburn.    Marland Kitchen HYDROmorphone (DILAUDID) 2 MG tablet Take 1 tablet (2 mg total) by mouth every 4 (four) hours as needed for severe pain. (Patient not taking: Reported on 10/02/2016) 8 tablet 0  .  ibuprofen (ADVIL,MOTRIN) 600 MG tablet Take 1 tablet (600 mg total) by mouth every 6 (six) hours. (Patient not taking: Reported on 10/02/2016) 30 tablet 0  . ibuprofen (ADVIL,MOTRIN) 800 MG tablet Take 1 tablet (800 mg total) by mouth every 8 (eight) hours as needed. (Patient not taking: Reported on 10/02/2016) 50 tablet 1  . norethindrone (ORTHO MICRONOR) 0.35 MG tablet Take 1 tablet (0.35 mg total) by mouth daily. (Patient not taking: Reported on 10/02/2016) 1 Package 11  . Omega-3 Fatty Acids (FISH OIL) 1000 MG CAPS Take 1 capsule by mouth daily.    . Prenatal Vit w/Fe-Methylfol-FA (PNV PO) Take 1 tablet by mouth daily.     . Probiotic Product (PROBIOTIC ADVANCED PO) Take 2 capsules by mouth daily.     No current facility-administered medications on file prior to visit.     Social History   Social History  . Marital status: Single    Spouse name: N/A  . Number of children: N/A  . Years of education: N/A   Occupational History  . Not on file.   Social History Main Topics  . Smoking status: Never Smoker  . Smokeless tobacco: Never Used  . Alcohol use Yes  . Drug use: No  . Sexual  activity: Yes     Comment: currently pregnant   Other Topics Concern  . Not on file   Social History Narrative  . No narrative on file    Family History  Problem Relation Age of Onset  . Alcohol abuse Father   . Emphysema Father   . Alcohol abuse Brother   . Stroke Brother   . COPD Paternal Grandmother   . Heart disease Paternal Grandfather        enlarged heart  . Multiple sclerosis Sister     BP 122/76   Pulse 75   Ht 5\' 4"  (5.974 m)   Wt 201 lb (91.2 kg)   LMP 10/02/2016 (Exact Date)   BMI 34.50 kg/m      Objective:   Physical Exam  Constitutional: She is oriented to person, place, and time. She appears well-developed and well-nourished.  HENT:  Head: Normocephalic and atraumatic.  Eyes: Conjunctivae and EOM are normal. Pupils are equal, round, and reactive to light.  Neck: Normal  range of motion. Neck supple.  Cardiovascular: Normal rate, regular rhythm and intact distal pulses.   Pulmonary/Chest: Effort normal.  Abdominal: Soft.  Musculoskeletal: She exhibits tenderness (Pain and swelling of the right lateral foot with ecchymosis lateral and posterior and dorsal right toes.  NV intact.  ROM of ankle is full.  Limp to right.  Left ankle and foot negative.).  Neurological: She is alert and oriented to person, place, and time. She displays normal reflexes. No cranial nerve deficit. She exhibits normal muscle tone. Coordination normal.  Skin: Skin is warm and dry.  Psychiatric: She has a normal mood and affect. Her behavior is normal. Judgment and thought content normal.  Vitals reviewed.   X-rays were done of the right foot, reported separately.      Assessment & Plan:   Encounter Diagnoses  Name Primary?  . Closed fracture of fifth metatarsal bone of right foot, initial encounter   . Pain in joint involving right ankle and foot Yes   I have explained the findings of a fracture of the fifth metatarsal.  I have given sheet of instructions for use of contrast baths.  Elevate, ice, limit weight bearing.  Return in two weeks.  She prefers to come back after the first of next month.  X-rays on return.  Call if any problem.  Precautions discussed.  Electronically Signed Sanjuana Kava, MD 6/5/20181:53 PM

## 2016-10-08 ENCOUNTER — Telehealth: Payer: Self-pay | Admitting: Orthopaedic Surgery

## 2016-10-11 ENCOUNTER — Telehealth: Payer: Self-pay

## 2016-10-11 NOTE — Telephone Encounter (Signed)
Pt called to make sure she would not have any other charges with the Fx she was charged with on her last visit.  I told her she would be in a 90 day follow up and she would not be charged any OV for this DX for 90 days.  If she has xray's or anything other than an OV she will have charges.  I also let her know she will receive a self pay discount.  She asked me to check and see if she has to wear her boot at night.  I talked with [facility] and she said she can take it off at night if she needs to for sleeping.

## 2016-10-16 ENCOUNTER — Ambulatory Visit (INDEPENDENT_AMBULATORY_CARE_PROVIDER_SITE_OTHER): Payer: Medicaid Other

## 2016-10-16 ENCOUNTER — Ambulatory Visit (INDEPENDENT_AMBULATORY_CARE_PROVIDER_SITE_OTHER): Payer: Self-pay | Admitting: Orthopaedic Surgery

## 2016-10-16 DIAGNOSIS — S92351D Displaced fracture of fifth metatarsal bone, right foot, subsequent encounter for fracture with routine healing: Secondary | ICD-10-CM | POA: Diagnosis not present

## 2016-10-16 NOTE — Progress Notes (Signed)
CC:  My foot is still a little sore  She is using a CAM walker and a wheeled scooter for her leg.  She has no new trauma.  Her right foot is tender but has no redness or swelling.  NV intact.  X-rays were done of the right foot, reported separately.  Encounter Diagnosis  Name Primary?  . Fracture of base of fifth metatarsal bone, right, with routine healing, subsequent encounter Yes   Return in one month with x-rays of the right foot.  Call if any problem.  Precautions discussed.   Electronically Signed Sanjuana Kava, MD 6/19/20188:59 AM

## 2016-10-17 ENCOUNTER — Ambulatory Visit (INDEPENDENT_AMBULATORY_CARE_PROVIDER_SITE_OTHER): Payer: Medicaid Other

## 2016-10-17 ENCOUNTER — Ambulatory Visit (INDEPENDENT_AMBULATORY_CARE_PROVIDER_SITE_OTHER): Payer: Medicaid Other | Admitting: Orthopaedic Surgery

## 2016-10-17 ENCOUNTER — Encounter: Payer: Self-pay | Admitting: Orthopaedic Surgery

## 2016-10-17 ENCOUNTER — Ambulatory Visit: Payer: Medicaid Other

## 2016-10-17 VITALS — BP 113/79 | HR 83 | Ht 64.0 in | Wt 198.0 lb

## 2016-10-17 DIAGNOSIS — M25532 Pain in left wrist: Secondary | ICD-10-CM

## 2016-10-17 NOTE — Progress Notes (Signed)
Patient YQ:MVHQION Michelle Page, female DOB:[birth year], 41 y.o. GEX:[id number]  Chief Complaint  Patient presents with  . New Problem    left wrist pain    HPI  Michelle Page is a 41 y.o. female who has been using a CAM walker for right foot injury. She has developed pain in the left wrist that will not go away.  She has no swelling, no redness.  She has pain most of the time.  She is concerned she may have a fracture.  She uses a cast scooter. HPI  Body mass index is 33.99 kg/m.  ROS  Review of Systems  HENT: Negative for congestion.   Respiratory: Negative for cough and shortness of breath.   Cardiovascular: Negative for chest pain and leg swelling.  Endocrine: Negative for cold intolerance.  Musculoskeletal: Positive for arthralgias and gait problem.  Allergic/Immunologic: Negative for environmental allergies.    Past Medical History:  Diagnosis Date  . Abnormal Pap smear    colpo/leep  . Anemia   . Cancer [facility]) 2006   Skin  . Chlamydia infection   . Depression   . Fractures   . H/O candidiasis   . H/O varicella   . Headache(784.0)    migraines as a child   . Heart murmur   . High risk HPV infection   . Pregnancy induced hypertension    1st & 2nd pregnancy  . Two vessel umbilical cord, antepartum 11/26/2011  . Yeast infection     Past Surgical History:  Procedure Laterality Date  . COLPOSCOPY    . LEEP    . WISDOM TOOTH EXTRACTION      Family History  Problem Relation Age of Onset  . Alcohol abuse Father   . Emphysema Father   . Alcohol abuse Brother   . Stroke Brother   . COPD Paternal Grandmother   . Heart disease Paternal Grandfather        enlarged heart  . Multiple sclerosis Sister     Social History Social History  Substance Use Topics  . Smoking status: Never Smoker  . Smokeless tobacco: Never Used  . Alcohol use Yes    No Known Allergies  Current Outpatient Prescriptions  Medication Sig Dispense Refill  . acetaminophen  (TYLENOL) 500 MG tablet Take 1,000 mg by mouth every 6 (six) hours as needed for moderate pain.    . Butalbital-APAP-Caffeine (FIORICET PO) Take 1 tablet by mouth every 6 (six) hours as needed (headache).     . calcium carbonate (TUMS EX) 750 MG chewable tablet Chew 2 tablets by mouth 2 (two) times daily as needed for heartburn.    Marland Kitchen HYDROmorphone (DILAUDID) 2 MG tablet Take 1 tablet (2 mg total) by mouth every 4 (four) hours as needed for severe pain. (Patient not taking: Reported on 10/02/2016) 8 tablet 0  . ibuprofen (ADVIL,MOTRIN) 600 MG tablet Take 1 tablet (600 mg total) by mouth every 6 (six) hours. (Patient not taking: Reported on 10/02/2016) 30 tablet 0  . ibuprofen (ADVIL,MOTRIN) 800 MG tablet Take 1 tablet (800 mg total) by mouth every 8 (eight) hours as needed. (Patient not taking: Reported on 10/02/2016) 50 tablet 1  . norethindrone (ORTHO MICRONOR) 0.35 MG tablet Take 1 tablet (0.35 mg total) by mouth daily. (Patient not taking: Reported on 10/02/2016) 1 Package 11  . Omega-3 Fatty Acids (FISH OIL) 1000 MG CAPS Take 1 capsule by mouth daily.    . Prenatal Vit w/Fe-Methylfol-FA (PNV PO) Take 1 tablet by mouth daily.     Marland Kitchen  Probiotic Product (PROBIOTIC ADVANCED PO) Take 2 capsules by mouth daily.     No current facility-administered medications for this visit.      Physical Exam  Blood pressure 113/79, pulse 83, height 5\' 4"  (1.626 m), weight 198 lb (89.8 kg), last menstrual period 10/02/2016, unknown if currently breastfeeding.  Constitutional: overall normal hygiene, normal nutrition, well developed, normal grooming, normal body habitus. Assistive device:CAM walker and cast scooter  Musculoskeletal: gait and station Limp right, muscle tone and strength are normal, no tremors or atrophy is present.  .  Neurological: coordination overall normal.  Deep tendon reflex/nerve stretch intact.  Sensation normal.  Cranial nerves II-XII intact.   Skin:   Normal overall no scars, lesions, ulcers  or rashes. No psoriasis.  Psychiatric: Alert and oriented x 3.  Recent memory intact, remote memory unclear.  Normal mood and affect. Well groomed.  Good eye contact.  Cardiovascular: overall no swelling, no varicosities, no edema bilaterally, normal temperatures of the legs and arms, no clubbing, cyanosis and good capillary refill.  Lymphatic: palpation is normal.  The left wrist is tender but has full ROM.  She has no swelling or redness. Grips are normal. NV intact.  X-rays were done of the left wrist, reported separately.  The patient has been educated about the nature of the problem(s) and counseled on treatment options.  The patient appeared to understand what I have discussed and is in agreement with it.  Encounter Diagnosis  Name Primary?  . Acute wrist pain, left Yes    PLAN Call if any problems.  Precautions discussed.  Continue current medications.   Return to clinic keep regular appointment with the right foot/ankle   A cock-up splint was provided.  Instructions for use given.  Electronically Signed Sanjuana Kava, MD 6/20/201810:11 AM

## 2016-11-06 ENCOUNTER — Ambulatory Visit (INDEPENDENT_AMBULATORY_CARE_PROVIDER_SITE_OTHER): Payer: Medicaid Other

## 2016-11-06 ENCOUNTER — Encounter: Payer: Self-pay | Admitting: Orthopaedic Surgery

## 2016-11-06 ENCOUNTER — Ambulatory Visit: Payer: Medicaid Other | Admitting: Orthopaedic Surgery

## 2016-11-06 ENCOUNTER — Ambulatory Visit (INDEPENDENT_AMBULATORY_CARE_PROVIDER_SITE_OTHER): Payer: Self-pay | Admitting: Orthopaedic Surgery

## 2016-11-06 DIAGNOSIS — S92351D Displaced fracture of fifth metatarsal bone, right foot, subsequent encounter for fracture with routine healing: Secondary | ICD-10-CM

## 2016-11-06 NOTE — Progress Notes (Signed)
CC:  My foot is better but still hurts at times.  She has been using her CAM walker on the right.  She has less pain but it still hurts now and then.  NV intact.  X-rays were done, reported separately.  Encounter Diagnosis  Name Primary?  . Fracture of base of fifth metatarsal bone of right foot with routine healing, subsequent encounter Yes   She can come out of CAM walker as tolerated.  Precautions discussed.  Return in three weeks.  Call if any problem.  X-rays on return.  Electronically Signed Sanjuana Kava, MD 7/10/20189:05 AM

## 2016-11-27 ENCOUNTER — Encounter: Payer: Self-pay | Admitting: Orthopaedic Surgery

## 2016-11-27 ENCOUNTER — Ambulatory Visit (INDEPENDENT_AMBULATORY_CARE_PROVIDER_SITE_OTHER): Payer: Self-pay | Admitting: Orthopaedic Surgery

## 2016-11-27 ENCOUNTER — Ambulatory Visit (INDEPENDENT_AMBULATORY_CARE_PROVIDER_SITE_OTHER): Payer: Medicaid Other

## 2016-11-27 DIAGNOSIS — S92351D Displaced fracture of fifth metatarsal bone, right foot, subsequent encounter for fracture with routine healing: Secondary | ICD-10-CM

## 2016-11-27 NOTE — Progress Notes (Signed)
CC:  My foot still hurts at times  She continues to have some right lateral sided foot pain.  She is wearing regular shoes.  She has no new trauma.  NV intact. She has no redness.  X-rays were done, reported separately.  She has no union of fracture yet and minimal callus.  Encounter Diagnosis  Name Primary?  . Fracture of base of fifth metatarsal bone of right foot with routine healing, subsequent encounter Yes   Return in one month.  X-rays on return.  Call if any problem.  Precautions discussed.   Electronically Signed Sanjuana Kava, MD 7/31/20189:30 AM

## 2016-12-20 ENCOUNTER — Ambulatory Visit (INDEPENDENT_AMBULATORY_CARE_PROVIDER_SITE_OTHER): Payer: Medicaid Other | Admitting: Otolaryngology

## 2016-12-20 DIAGNOSIS — H6983 Other specified disorders of Eustachian tube, bilateral: Secondary | ICD-10-CM

## 2016-12-20 DIAGNOSIS — H93293 Other abnormal auditory perceptions, bilateral: Secondary | ICD-10-CM

## 2016-12-25 ENCOUNTER — Ambulatory Visit (INDEPENDENT_AMBULATORY_CARE_PROVIDER_SITE_OTHER): Payer: Medicaid Other

## 2016-12-25 ENCOUNTER — Ambulatory Visit (INDEPENDENT_AMBULATORY_CARE_PROVIDER_SITE_OTHER): Payer: Self-pay | Admitting: Orthopaedic Surgery

## 2016-12-25 DIAGNOSIS — S92351D Displaced fracture of fifth metatarsal bone, right foot, subsequent encounter for fracture with routine healing: Secondary | ICD-10-CM

## 2016-12-25 NOTE — Progress Notes (Signed)
CC:  My foot is sore now and then  She is walking well with no problem  NV intact.  She has normal gait.  X-rays were done and reported separately.  Encounter Diagnosis  Name Primary?  . Fracture of base of fifth metatarsal bone of right foot with routine healing, subsequent encounter Yes   I have told her that she still has some separation of the bone at the base of the fifth metatarsal.  It is not hurting.  There is the remote possibility this may not heal completely and might need surgery.  As long as she is doing well and having no problem, then I would not recommend anything.  If it changes, she is to contact me.  I will see her as needed.  Electronically Signed Sanjuana Kava, MD 8/28/20189:29 AM

## 2017-01-29 ENCOUNTER — Encounter: Payer: Self-pay | Admitting: Family Medicine

## 2017-01-29 ENCOUNTER — Ambulatory Visit (INDEPENDENT_AMBULATORY_CARE_PROVIDER_SITE_OTHER): Payer: Medicaid Other | Admitting: Family Medicine

## 2017-01-29 VITALS — BP 136/88 | HR 88 | Temp 98.5°F | Resp 16 | Ht 64.0 in | Wt 196.1 lb

## 2017-01-29 DIAGNOSIS — Z23 Encounter for immunization: Secondary | ICD-10-CM | POA: Diagnosis not present

## 2017-01-29 DIAGNOSIS — C4330 Malignant melanoma of unspecified part of face: Secondary | ICD-10-CM | POA: Insufficient documentation

## 2017-01-29 DIAGNOSIS — L508 Other urticaria: Secondary | ICD-10-CM | POA: Insufficient documentation

## 2017-01-29 DIAGNOSIS — F329 Major depressive disorder, single episode, unspecified: Secondary | ICD-10-CM | POA: Diagnosis not present

## 2017-01-29 DIAGNOSIS — R87619 Unspecified abnormal cytological findings in specimens from cervix uteri: Secondary | ICD-10-CM

## 2017-01-29 DIAGNOSIS — R87610 Atypical squamous cells of undetermined significance on cytologic smear of cervix (ASC-US): Secondary | ICD-10-CM | POA: Insufficient documentation

## 2017-01-29 HISTORY — DX: Malignant melanoma of unspecified part of face: C43.30

## 2017-01-29 MED ORDER — CITALOPRAM HYDROBROMIDE 20 MG PO TABS: 20.0000 mg | ORAL_TABLET | Freq: Every day | ORAL | 3 refills | Status: DC

## 2017-01-29 NOTE — Patient Instructions (Signed)
Take antihistamine twice a day Take the citalopram once a day See me for a PE in 4 weeks GYN consultation ordered with [facility]

## 2017-01-29 NOTE — Progress Notes (Signed)
Chief Complaint  Patient presents with  . Hypertension  new patient No primary care for years Mostly wants to discuss anxiety and depression Old records reviewed in chart Health maintenance up to date Agrees to flu shot today  Has 6 children, youngest is one.  She tells me she is not using birth control.  She says her sig other does not want more children.  She needs GYN consult.  I have discussed the multiple health risks associated with cigarette smoking including, but not limited to, cardiovascular disease, lung disease and cancer.  I have strongly recommended that smoking be stopped.  I have reviewed the various methods of quitting including cold [place], classes, nicotine replacements and prescription medications.  I have offered assistance in this difficult process.  The patient is not interested in assistance at this time.  She has been depressed in the past when her husband left her.  Has been with todd for 3 years.  They just moved into new house.  Has been easily upset, tearful, feels out of control of emotions but must pretend to be OK for the kids.  We discussed medicines.  She is not at all suicidal.  Always feels overwhelmed.  Has been breaking out in itchy rash off an on since April. Thinks it is "nerves"   Patient Active Problem List   Diagnosis Date Noted  . Chronic urticaria 01/29/2017  . Melanoma of face ([place]) 01/29/2017  . Abnormal Pap smear of cervix 01/29/2017  . Heart murmur   . High risk HPV infection   . Depression 09/12/2011  . Anemia 09/12/2011    Outpatient Encounter Prescriptions as of 01/29/2017  Medication Sig  . citalopram (CELEXA) 20 MG tablet Take 1 tablet (20 mg total) by mouth daily.   No facility-administered encounter medications on file as of 01/29/2017.     No Known Allergies  Review of Systems  Constitutional: Negative for activity change, appetite change and unexpected weight change.  HENT: Negative for congestion, dental problem,  postnasal drip and rhinorrhea.   Eyes: Negative for redness and visual disturbance.  Respiratory: Negative for cough and shortness of breath.   Cardiovascular: Negative for chest pain, palpitations and leg swelling.  Gastrointestinal: Negative for abdominal pain, constipation and diarrhea.  Genitourinary: Negative for difficulty urinating, frequency and menstrual problem.  Musculoskeletal: Negative for arthralgias and back pain.  Neurological: Negative for dizziness and headaches.  Psychiatric/Behavioral: Positive for decreased concentration and dysphoric mood. Negative for self-injury, sleep disturbance and suicidal ideas. The patient is nervous/anxious.     BP 136/88 (BP Location: Right Arm, Patient Position: Sitting, Cuff Size: Normal)   Pulse 88   Temp 98.5 F (36.9 C) (Temporal)   Resp 16   Ht 5\' 4"  (1.626 m)   Wt 196 lb 1.9 oz (89 kg)   LMP 01/08/2017 (Exact Date)   SpO2 100%   BMI 33.66 kg/m   Physical Exam  Constitutional: She is oriented to person, place, and time. She appears well-developed and well-nourished. No distress.  HENT:  Head: Normocephalic and atraumatic.  Nose: Nose normal.  Well healed scar R cheek   Eyes: Pupils are equal, round, and reactive to light. Conjunctivae are normal.  Neck: Normal range of motion. Neck supple.  Cardiovascular: Normal rate, regular rhythm and normal heart sounds.   Pulmonary/Chest: Effort normal and breath sounds normal.  Musculoskeletal: Normal range of motion. She exhibits no edema.  Neurological: She is alert and oriented to person, place, and time.  Psychiatric:  Tearful, depressed mood    ASSESSMENT/PLAN:  1. Chronic urticaria Take cetirizine 2 x a day  2. Need for influenza vaccination - Flu Vaccine QUAD 36+ mos IM  3. Reactive depression - COMPLETE METABOLIC PANEL WITH GFR - CBC with Differential/Platelet - TSH - Urinalysis, Routine w reflex microscopic - Lipid panel  4. Melanoma of face ([place]) Healed with  yearly screenings  5. Abnormal cervical Papanicolaou smear, unspecified abnormal pap finding Needs birth control - Ambulatory referral to Gynecology   Patient Instructions  Take antihistamine twice a day Take the citalopram once a day See me for a PE in 4 weeks GYN consultation ordered with [facility]    Raylene Everts, MD

## 2017-01-30 LAB — COMPLETE METABOLIC PANEL WITH GFR
AG Ratio: 1.8 (calc) (ref 1.0–2.5)
ALT: 10 U/L (ref 6–29)
AST: 17 U/L (ref 10–30)
Albumin: 4.6 g/dL (ref 3.6–5.1)
Alkaline phosphatase (APISO): 62 U/L (ref 33–115)
BUN: 10 mg/dL (ref 7–25)
CO2: 22 mmol/L (ref 20–32)
Calcium: 9.1 mg/dL (ref 8.6–10.2)
Chloride: 103 mmol/L (ref 98–110)
Creat: 0.65 mg/dL (ref 0.50–1.10)
GFR, Est African American: 130 mL/min/{1.73_m2} (ref >=60)
GFR, Est Non African American: 112 mL/min/{1.73_m2} (ref >=60)
Globulin: 2.5 g/dL (calc) (ref 1.9–3.7)
Glucose, Bld: 93 mg/dL (ref 65–99)
Potassium: 4 mmol/L (ref 3.5–5.3)
Sodium: 136 mmol/L (ref 135–146)
Total Bilirubin: 0.4 mg/dL (ref 0.2–1.2)
Total Protein: 7.1 g/dL (ref 6.1–8.1)

## 2017-01-30 LAB — CBC WITH DIFFERENTIAL/PLATELET
Basophils Absolute: 23 cells/uL (ref 0–200)
Basophils Relative: 0.3 %
Eosinophils Absolute: 8 cells/uL — ABNORMAL LOW (ref 15–500)
Eosinophils Relative: 0.1 %
HCT: 39.4 % (ref 35.0–45.0)
Hemoglobin: 13.5 g/dL (ref 11.7–15.5)
Lymphs Abs: 1669 cells/uL (ref 850–3900)
MCH: 30.4 pg (ref 27.0–33.0)
MCHC: 34.3 g/dL (ref 32.0–36.0)
MCV: 88.7 fL (ref 80.0–100.0)
MPV: 11.4 fL (ref 7.5–12.5)
Monocytes Relative: 4.5 %
Neutro Abs: 5749 cells/uL (ref 1500–7800)
Neutrophils Relative %: 73.7 %
Platelets: 157 10*3/uL (ref 140–400)
RBC: 4.44 10*6/uL (ref 3.80–5.10)
RDW: 12.3 % (ref 11.0–15.0)
Total Lymphocyte: 21.4 %
WBC mixed population: 351 cells/uL (ref 200–950)
WBC: 7.8 10*3/uL (ref 3.8–10.8)

## 2017-01-30 LAB — URINALYSIS, ROUTINE W REFLEX MICROSCOPIC
Bilirubin Urine: NEGATIVE
Glucose, UA: NEGATIVE
Hgb urine dipstick: NEGATIVE
Leukocytes, UA: NEGATIVE
Nitrite: NEGATIVE
Protein, ur: NEGATIVE
Specific Gravity, Urine: 1.014 (ref 1.001–1.03)
pH: 6 (ref 5.0–8.0)

## 2017-01-30 LAB — LIPID PANEL
Cholesterol: 148 mg/dL (ref <200)
HDL: 45 mg/dL — ABNORMAL LOW (ref >50)
LDL Cholesterol (Calc): 82 mg/dL (calc)
Non-HDL Cholesterol (Calc): 103 mg/dL (calc) (ref <130)
Total CHOL/HDL Ratio: 3.3 (calc) (ref <5.0)
Triglycerides: 115 mg/dL (ref <150)

## 2017-01-30 LAB — TSH: TSH: 2.21 mIU/L

## 2017-02-27 ENCOUNTER — Encounter: Payer: Medicaid Other | Admitting: Family Medicine

## 2017-03-13 ENCOUNTER — Encounter: Payer: Self-pay | Admitting: Obstetrics and Gynecology

## 2017-03-26 ENCOUNTER — Telehealth: Payer: Self-pay | Admitting: *Deleted

## 2017-03-26 NOTE — Telephone Encounter (Signed)
Patient called stating she noticed some vaginal bleeding last night only when she wiped before she went to bed. She woke up this morning still with light spotting and cramping. Last intercourse was Sunday night. She is scheduled for a new gyn preg test on 12/5. Please advise.

## 2017-03-26 NOTE — Telephone Encounter (Signed)
Spotting, offered appt for tomorrow at 3:15 pm and she will try, she says

## 2017-03-27 ENCOUNTER — Ambulatory Visit: Payer: Medicaid Other | Admitting: Adult Health

## 2017-03-27 ENCOUNTER — Encounter: Payer: Self-pay | Admitting: Adult Health

## 2017-03-27 VITALS — BP 128/72 | HR 86 | Ht 64.0 in | Wt 200.0 lb

## 2017-03-27 DIAGNOSIS — R319 Hematuria, unspecified: Secondary | ICD-10-CM

## 2017-03-27 DIAGNOSIS — N939 Abnormal uterine and vaginal bleeding, unspecified: Secondary | ICD-10-CM

## 2017-03-27 DIAGNOSIS — Z1159 Encounter for screening for other viral diseases: Secondary | ICD-10-CM

## 2017-03-27 DIAGNOSIS — Z3202 Encounter for pregnancy test, result negative: Secondary | ICD-10-CM | POA: Diagnosis not present

## 2017-03-27 LAB — POCT URINALYSIS DIPSTICK
Glucose, UA: NEGATIVE
Ketones, UA: NEGATIVE
Leukocytes, UA: NEGATIVE
Nitrite, UA: NEGATIVE
Protein, UA: NEGATIVE

## 2017-03-27 LAB — POCT URINE PREGNANCY: Preg Test, Ur: NEGATIVE

## 2017-03-27 NOTE — Progress Notes (Signed)
Subjective:     Patient ID: Michelle Page, female   DOB: 30-Jun-1975, 41 y.o.   MRN: [id number]  HPI [name] is a 41 year old white female in for UPT, had missed a period and then had 3+HPT, then started bleeding and passed tissue and has back ache. She said LMP 10/8 then 2 weeks later bleed some with odor.  She cares for her Dad who has hepatitis  C and requests labs for that. She has 39 month old at home but was [facility] with being pregnant again.   Review of Systems +missed period with 3+ HPT, then started bleeding and passed tissue, and back hurts Reviewed past medical,surgical, social and family history. Reviewed medications and allergies.     Objective:   Physical Exam BP 128/72 (BP Location: Left Arm, Patient Position: Sitting, Cuff Size: Large)   Pulse 86   Ht 5\' 4"  (1.626 m)   Wt 200 lb (90.7 kg)   LMP 02/04/2017   Breastfeeding? No   BMI 34.33 kg/m UPT negative, urine dipstick 1+blood. Skin warm and dry.Pelvic: external genitalia is normal in appearance no lesions, vagina:period blood without odor,urethra has no lesions or masses noted, cervix:smooth and bulbous,no CMT uterus: normal size, shape and contour, mildly tender, no masses felt, adnexa: no masses or tenderness noted. Bladder is non tender and no masses felt.    Blood type A+.  Assessment:     1. Vaginal bleeding   2. Pregnancy examination or test, negative result   3. Hematuria, unspecified type   4. Need for hepatitis C screening test       Plan:      Check QHCG and hepatitis C antibody F/U in 1 week

## 2017-03-28 LAB — HEPATITIS C ANTIBODY: Hep C Virus Ab: <0.1 s/co ratio (ref 0.0–0.9)

## 2017-03-28 LAB — BETA HCG QUANT (REF LAB): hCG Quant: <1 m[IU]/mL

## 2017-04-03 ENCOUNTER — Encounter: Payer: Self-pay | Admitting: Adult Health

## 2017-04-03 ENCOUNTER — Other Ambulatory Visit: Payer: Self-pay

## 2017-04-03 ENCOUNTER — Ambulatory Visit: Payer: Medicaid Other | Admitting: Adult Health

## 2017-04-03 ENCOUNTER — Encounter: Payer: Medicaid Other | Admitting: Adult Health

## 2017-04-03 VITALS — BP 110/68 | HR 97 | Ht 64.0 in | Wt 205.0 lb

## 2017-04-03 DIAGNOSIS — N939 Abnormal uterine and vaginal bleeding, unspecified: Secondary | ICD-10-CM | POA: Diagnosis not present

## 2017-04-03 NOTE — Progress Notes (Signed)
Subjective:     Patient ID: Michelle Page, female   DOB: May 16, 1975, 41 y.o.   MRN: [id number]  HPI Michelle Page is a 41 year old white female, back in follow up of bleeding and negative QHCG, Bleeding has stopped.  Review of Systems  Bleeding has stopped  Reviewed past medical,surgical, social and family history. Reviewed medications and allergies.     Objective:   Physical Exam BP 110/68 (BP Location: Right Arm, Patient Position: Sitting, Cuff Size: Normal)   Pulse 97   Ht 5\' 4"  (1.626 m)   Wt 205 lb (93 kg)   LMP 03/25/2017 (Approximate)   BMI 35.19 kg/m  Skin warm and dry. Lungs: clear to ausculation bilaterally. Cardiovascular: regular rate and rhythm.Abdomen is soft and non tender.    Assessment:     Vaginal bleeding resolved    Plan:     Use condoms for now Take PNV F/U 12/27 at 3 pm for pap and physical

## 2017-04-03 NOTE — Patient Instructions (Signed)
Use condoms  

## 2017-04-25 ENCOUNTER — Other Ambulatory Visit: Payer: Medicaid Other | Admitting: Adult Health

## 2017-04-30 NOTE — L&D Delivery Note (Signed)
Patient: Michelle Page MRN: [id number]  GBS status: postive, IAP given PenG x2  Patient is a 42 y.o. now [id number] s/p NSVD at [redacted]w[redacted]d, who was admitted for IOL. SROM 0h 4m prior to delivery with clear fluid.    Delivery Note Head delivered LOA. Nuchal cord x2 present, reduced after delivery. Shoulder and body delivered in usual fashion. Infant with spontaneous cry, placed on mother's abdomen, dried and bulb suctioned. Cord clamped x 2 after 1-minute delay, and cut by family member. Cord blood drawn. Placenta delivered spontaneously with gentle cord traction. Fundus firm with massage and Pitocin. Perineum inspected and found to have no laceration.   At 8:42 AM a healthy female was delivered via Vaginal, Spontaneous (Presentation: Vertex; LOA).  APGAR: 9, 9; weight pending.   Placenta status: spontaneous, intact. 3V Cord  Anesthesia:  epidural Episiotomy: None Lacerations: None Suture Repair: none Est. Blood Loss (mL): 10  Mom to postpartum.  Baby to Couplet care / Skin to Skin.  Demetrius Revel 01/22/2018, 9:05 AM

## 2017-05-29 ENCOUNTER — Encounter: Payer: Self-pay | Admitting: Adult Health

## 2017-05-29 ENCOUNTER — Ambulatory Visit: Payer: Medicaid Other | Admitting: Adult Health

## 2017-05-29 ENCOUNTER — Other Ambulatory Visit (HOSPITAL_COMMUNITY)
Admission: RE | Admit: 2017-05-29 | Discharge: 2017-05-29 | Disposition: A | Discharge status: Home / Self Care | Payer: Medicaid Other | Source: Ambulatory Visit | Attending: Adult Health | Admitting: Adult Health

## 2017-05-29 VITALS — BP 120/60 | HR 97 | Ht 61.0 in | Wt 208.0 lb

## 2017-05-29 DIAGNOSIS — O09521 Supervision of elderly multigravida, first trimester: Secondary | ICD-10-CM

## 2017-05-29 DIAGNOSIS — Z8742 Personal history of other diseases of the female genital tract: Secondary | ICD-10-CM | POA: Diagnosis not present

## 2017-05-29 DIAGNOSIS — Z01411 Encounter for gynecological examination (general) (routine) with abnormal findings: Secondary | ICD-10-CM | POA: Diagnosis not present

## 2017-05-29 DIAGNOSIS — R110 Nausea: Secondary | ICD-10-CM | POA: Diagnosis not present

## 2017-05-29 DIAGNOSIS — R87810 Cervical high risk human papillomavirus (HPV) DNA test positive: Secondary | ICD-10-CM | POA: Diagnosis not present

## 2017-05-29 DIAGNOSIS — O3680X0 Pregnancy with inconclusive fetal viability, not applicable or unspecified: Secondary | ICD-10-CM

## 2017-05-29 DIAGNOSIS — Z01419 Encounter for gynecological examination (general) (routine) without abnormal findings: Secondary | ICD-10-CM

## 2017-05-29 DIAGNOSIS — N926 Irregular menstruation, unspecified: Secondary | ICD-10-CM | POA: Diagnosis not present

## 2017-05-29 DIAGNOSIS — Z3A01 Less than 8 weeks gestation of pregnancy: Secondary | ICD-10-CM | POA: Insufficient documentation

## 2017-05-29 DIAGNOSIS — Z3201 Encounter for pregnancy test, result positive: Secondary | ICD-10-CM | POA: Diagnosis not present

## 2017-05-29 LAB — POCT URINE PREGNANCY: Preg Test, Ur: POSITIVE — AB

## 2017-05-29 MED ORDER — PRENATAL PLUS 27-1 MG PO TABS: 1.0000 | ORAL_TABLET | Freq: Every day | ORAL | 12 refills | Status: DC

## 2017-05-29 NOTE — Progress Notes (Signed)
Patient ID: Michelle Page, female   DOB: 1976/02/28, 42 y.o.   MRN: [id number] History of Present Illness:  Michelle Page is a 42 year old white female in for well woman gyn exam and pap, she has missed a period and has some nausea. UPT +in office today.    Current Medications, Allergies, Past Medical History, Past Surgical History, Family History and Social History were reviewed in [employer] record.     Review of Systems: Patient denies any headaches, hearing loss, fatigue, blurred vision, shortness of breath, chest pain, abdominal pain, problems with bowel movements, urination, or intercourse. No joint pain or mood swings. +missed period + some nausea    Physical Exam:BP 120/60 (BP Location: Left Arm, Patient Position: Sitting, Cuff Size: Large)   Pulse 97   Ht 5\' 1"  (1.549 m)   Wt 208 lb (94.3 kg)   LMP 04/23/2017   BMI 39.30 kg/m UPT +, about 5+1 week with EDD 01/28/18. General:  Well developed, well nourished, no acute distress Skin:  Warm and dry Neck:  Midline trachea, normal thyroid, good ROM, no lymphadenopathy Lungs; Clear to auscultation bilaterally Breast:  No dominant palpable mass, retraction, or nipple discharge Cardiovascular: Regular rate and rhythm Abdomen:  Soft, non tender, no hepatosplenomegaly Pelvic:  External genitalia is normal in appearance, no lesions.  The vagina is normal in appearance. Urethra has no lesions or masses. The cervix is bulbous. Pap with HPV and GC/CHL performed. Uterus is felt to be normal size, shape, and contour.  No adnexal masses or tenderness noted.Bladder is non tender, no masses felt. Extremities/musculoskeletal:  No swelling or varicosities noted, no clubbing or cyanosis Psych:  No mood changes, alert and cooperative,seems happy PHQ 2 score 0.  Impression: 1. Encounter for gynecological examination with Papanicolaou smear of cervix   2. Pregnancy test positive   3. Less than [redacted] weeks gestation of pregnancy    4. Elderly multigravida in first trimester   5. Encounter to determine fetal viability of pregnancy, single or unspecified fetus   6. History of abnormal cervical Pap smear       Plan:  Meds ordered this encounter  Medications  . prenatal vitamin w/FE, FA (PRENATAL 1 + 1) 27-1 MG TABS tablet    Sig: Take 1 tablet by mouth daily at 12 noon.    Dispense:  30 each    Refill:  12    Order Specific Question:   Supervising Provider    Answer:   Florian Buff [[id number]]  Return in 2 weeks for dating [place] Review handouts on First trimester and by [facility] Physical in 1 year  Pap in 3 if normal Get mammogram after delivery and when stops breastfeeding

## 2017-05-29 NOTE — Addendum Note (Signed)
Addended by: Linton Rump on: 05/29/2017 03:36 PM   Modules accepted: Orders

## 2017-05-29 NOTE — Patient Instructions (Signed)
First Trimester of Pregnancy The first trimester of pregnancy is from week 1 until the end of week 13 (months 1 through 3). A week after a sperm fertilizes an egg, the egg will implant on the wall of the uterus. This embryo will begin to develop into a baby. Genes from you and your partner will form the baby. The female genes will determine whether the baby will be a boy or a girl. At 6-8 weeks, the eyes and face will be formed, and the heartbeat can be seen on ultrasound. At the end of 12 weeks, all the baby's organs will be formed. Now that you are pregnant, you will want to do everything you can to have a healthy baby. Two of the most important things are to get good prenatal care and to follow your health care provider's instructions. Prenatal care is all the medical care you receive before the baby's birth. This care will help prevent, find, and treat any problems during the pregnancy and childbirth. Body changes during your first trimester Your body goes through many changes during pregnancy. The changes vary from woman to woman.  You may gain or lose a couple of pounds at first.  You may feel sick to your stomach (nauseous) and you may throw up (vomit). If the vomiting is uncontrollable, call your health care provider.  You may tire easily.  You may develop headaches that can be relieved by medicines. All medicines should be approved by your health care provider.  You may urinate more often. Painful urination may mean you have a bladder infection.  You may develop heartburn as a result of your pregnancy.  You may develop constipation because certain hormones are causing the muscles that push stool through your intestines to slow down.  You may develop hemorrhoids or swollen veins (varicose veins).  Your breasts may begin to grow larger and become tender. Your nipples may stick out more, and the tissue that surrounds them (areola) may become darker.  Your gums may bleed and may be  sensitive to brushing and flossing.  Dark spots or blotches (chloasma, mask of pregnancy) may develop on your face. This will likely fade after the baby is born.  Your menstrual periods will stop.  You may have a loss of appetite.  You may develop cravings for certain kinds of food.  You may have changes in your emotions from day to day, such as being excited to be pregnant or being concerned that something may go wrong with the pregnancy and baby.  You may have more vivid and strange dreams.  You may have changes in your hair. These can include thickening of your hair, rapid growth, and changes in texture. Some women also have hair loss during or after pregnancy, or hair that feels dry or thin. Your hair will most likely return to normal after your baby is born.  What to expect at prenatal visits During a routine prenatal visit:  You will be weighed to make sure you and the baby are growing normally.  Your blood pressure will be taken.  Your abdomen will be measured to track your baby's growth.  The fetal heartbeat will be listened to between weeks 10 and 14 of your pregnancy.  Test results from any previous visits will be discussed.  Your health care provider may ask you:  How you are feeling.  If you are feeling the baby move.  If you have had any abnormal symptoms, such as leaking fluid, bleeding, severe headaches,   or abdominal cramping.  If you are using any tobacco products, including cigarettes, chewing tobacco, and electronic cigarettes.  If you have any questions.  Other tests that may be performed during your first trimester include:  Blood tests to find your blood type and to check for the presence of any previous infections. The tests will also be used to check for low iron levels (anemia) and protein on red blood cells (Rh antibodies). Depending on your risk factors, or if you previously had diabetes during pregnancy, you may have tests to check for high blood  sugar that affects pregnant women (gestational diabetes).  Urine tests to check for infections, diabetes, or protein in the urine.  An ultrasound to confirm the proper growth and development of the baby.  Fetal screens for spinal cord problems (spina bifida) and Down syndrome.  HIV (human immunodeficiency virus) testing. Routine prenatal testing includes screening for HIV, unless you choose not to have this test.  You may need other tests to make sure you and the baby are doing well.  Follow these instructions at home: Medicines  Follow your health care provider's instructions regarding medicine use. Specific medicines may be either safe or unsafe to take during pregnancy.  Take a prenatal vitamin that contains at least 600 micrograms (mcg) of folic acid.  If you develop constipation, try taking a stool softener if your health care provider approves. Eating and drinking  Eat a balanced diet that includes fresh fruits and vegetables, whole grains, good sources of protein such as meat, eggs, or tofu, and low-fat dairy. Your health care provider will help you determine the amount of weight gain that is right for you.  Avoid raw meat and uncooked cheese. These carry germs that can cause birth defects in the baby.  Eating four or five small meals rather than three large meals a day may help relieve nausea and vomiting. If you start to feel nauseous, eating a few soda crackers can be helpful. Drinking liquids between meals, instead of during meals, also seems to help ease nausea and vomiting.  Limit foods that are high in fat and processed sugars, such as fried and sweet foods.  To prevent constipation: ? Eat foods that are high in fiber, such as fresh fruits and vegetables, whole grains, and beans. ? Drink enough fluid to keep your urine clear or pale yellow. Activity  Exercise only as directed by your health care provider. Most women can continue their usual exercise routine during  pregnancy. Try to exercise for 30 minutes at least 5 days a week. Exercising will help you: ? Control your weight. ? Stay in shape. ? Be prepared for labor and delivery.  Experiencing pain or cramping in the lower abdomen or lower back is a good sign that you should stop exercising. Check with your health care provider before continuing with normal exercises.  Try to avoid standing for long periods of time. Move your legs often if you must stand in one place for a long time.  Avoid heavy lifting.  Wear low-heeled shoes and practice good posture.  You may continue to have sex unless your health care provider tells you not to. Relieving pain and discomfort  Wear a good support bra to relieve breast tenderness.  Take warm sitz baths to soothe any pain or discomfort caused by hemorrhoids. Use hemorrhoid cream if your health care provider approves.  Rest with your legs elevated if you have leg cramps or low back pain.  If you develop   varicose veins in your legs, wear support hose. Elevate your feet for 15 minutes, 3-4 times a day. Limit salt in your diet. Prenatal care  Schedule your prenatal visits by the twelfth week of pregnancy. They are usually scheduled monthly at first, then more often in the last 2 months before delivery.  Write down your questions. Take them to your prenatal visits.  Keep all your prenatal visits as told by your health care provider. This is important. Safety  Wear your seat belt at all times when driving.  Make a list of emergency phone numbers, including numbers for family, friends, the hospital, and police and fire departments. General instructions  Ask your health care provider for a referral to a local prenatal education class. Begin classes no later than the beginning of month 6 of your pregnancy.  Ask for help if you have counseling or nutritional needs during pregnancy. Your health care provider can offer advice or refer you to specialists for help  with various needs.  Do not use hot tubs, steam rooms, or saunas.  Do not douche or use tampons or scented sanitary pads.  Do not cross your legs for long periods of time.  Avoid cat litter boxes and soil used by cats. These carry germs that can cause birth defects in the baby and possibly loss of the fetus by miscarriage or stillbirth.  Avoid all smoking, herbs, alcohol, and medicines not prescribed by your health care provider. Chemicals in these products affect the formation and growth of the baby.  Do not use any products that contain nicotine or tobacco, such as cigarettes and e-cigarettes. If you need help quitting, ask your health care provider. You may receive counseling support and other resources to help you quit.  Schedule a dentist appointment. At home, brush your teeth with a soft toothbrush and be gentle when you floss. Contact a health care provider if:  You have dizziness.  You have mild pelvic cramps, pelvic pressure, or nagging pain in the abdominal area.  You have persistent nausea, vomiting, or diarrhea.  You have a bad smelling vaginal discharge.  You have pain when you urinate.  You notice increased swelling in your face, hands, legs, or ankles.  You are exposed to fifth disease or chickenpox.  You are exposed to German measles (rubella) and have never had it. Get help right away if:  You have a fever.  You are leaking fluid from your vagina.  You have spotting or bleeding from your vagina.  You have severe abdominal cramping or pain.  You have rapid weight gain or loss.  You vomit blood or material that looks like coffee grounds.  You develop a severe headache.  You have shortness of breath.  You have any kind of trauma, such as from a fall or a car accident. Summary  The first trimester of pregnancy is from week 1 until the end of week 13 (months 1 through 3).  Your body goes through many changes during pregnancy. The changes vary from  woman to woman.  You will have routine prenatal visits. During those visits, your health care provider will examine you, discuss any test results you may have, and talk with you about how you are feeling. This information is not intended to replace advice given to you by your health care provider. Make sure you discuss any questions you have with your health care provider. Document Released: 04/10/2001 Document Revised: 03/28/2016 Document Reviewed: 03/28/2016 Elsevier Interactive Patient Education  2018 Elsevier   Inc.  

## 2017-05-31 LAB — CYTOLOGY - PAP
Chlamydia: NEGATIVE
Diagnosis: NEGATIVE
HPV: DETECTED — AB
Neisseria Gonorrhea: NEGATIVE

## 2017-06-03 ENCOUNTER — Encounter: Payer: Self-pay | Admitting: Adult Health

## 2017-06-03 DIAGNOSIS — R87810 Cervical high risk human papillomavirus (HPV) DNA test positive: Secondary | ICD-10-CM

## 2017-06-03 HISTORY — DX: Cervical high risk human papillomavirus (HPV) DNA test positive: R87.810

## 2017-06-12 ENCOUNTER — Ambulatory Visit (INDEPENDENT_AMBULATORY_CARE_PROVIDER_SITE_OTHER): Payer: Medicaid Other

## 2017-06-12 DIAGNOSIS — Z3A01 Less than 8 weeks gestation of pregnancy: Secondary | ICD-10-CM | POA: Diagnosis not present

## 2017-06-12 DIAGNOSIS — O3680X0 Pregnancy with inconclusive fetal viability, not applicable or unspecified: Secondary | ICD-10-CM | POA: Diagnosis not present

## 2017-06-12 NOTE — Progress Notes (Signed)
[place] 7+1 wks,single IUP w/ys,positive fht 147 bpm,normal ovaries bilat,crl 12.19 mm,EDD 01/28/2018 by LMP

## 2017-06-25 ENCOUNTER — Ambulatory Visit: Payer: Medicaid Other | Admitting: *Deleted

## 2017-06-25 ENCOUNTER — Encounter: Payer: Medicaid Other | Admitting: Advanced Practice Midwife

## 2017-07-02 ENCOUNTER — Ambulatory Visit: Payer: Medicaid Other | Admitting: *Deleted

## 2017-07-02 ENCOUNTER — Ambulatory Visit (INDEPENDENT_AMBULATORY_CARE_PROVIDER_SITE_OTHER): Payer: Medicaid Other | Admitting: Women's Health

## 2017-07-02 ENCOUNTER — Encounter: Payer: Self-pay | Admitting: Women's Health

## 2017-07-02 VITALS — BP 102/70 | HR 76 | Wt 204.0 lb

## 2017-07-02 DIAGNOSIS — O09291 Supervision of pregnancy with other poor reproductive or obstetric history, first trimester: Secondary | ICD-10-CM | POA: Diagnosis not present

## 2017-07-02 DIAGNOSIS — Z8759 Personal history of other complications of pregnancy, childbirth and the puerperium: Secondary | ICD-10-CM

## 2017-07-02 DIAGNOSIS — O0941 Supervision of pregnancy with grand multiparity, first trimester: Secondary | ICD-10-CM

## 2017-07-02 DIAGNOSIS — O0991 Supervision of high risk pregnancy, unspecified, first trimester: Secondary | ICD-10-CM

## 2017-07-02 DIAGNOSIS — O09521 Supervision of elderly multigravida, first trimester: Secondary | ICD-10-CM | POA: Diagnosis not present

## 2017-07-02 DIAGNOSIS — Z3A10 10 weeks gestation of pregnancy: Secondary | ICD-10-CM

## 2017-07-02 DIAGNOSIS — O09529 Supervision of elderly multigravida, unspecified trimester: Secondary | ICD-10-CM | POA: Insufficient documentation

## 2017-07-02 DIAGNOSIS — Z1389 Encounter for screening for other disorder: Secondary | ICD-10-CM

## 2017-07-02 DIAGNOSIS — Z331 Pregnant state, incidental: Secondary | ICD-10-CM

## 2017-07-02 DIAGNOSIS — O0990 Supervision of high risk pregnancy, unspecified, unspecified trimester: Secondary | ICD-10-CM | POA: Insufficient documentation

## 2017-07-02 DIAGNOSIS — Z3481 Encounter for supervision of other normal pregnancy, first trimester: Secondary | ICD-10-CM

## 2017-07-02 LAB — POCT URINALYSIS DIPSTICK
Blood, UA: NEGATIVE
Glucose, UA: NEGATIVE
Ketones, UA: NEGATIVE
Leukocytes, UA: NEGATIVE
Nitrite, UA: NEGATIVE
Protein, UA: NEGATIVE

## 2017-07-02 MED ORDER — PRENATAL PLUS 27-1 MG PO TABS: 1.0000 | ORAL_TABLET | Freq: Every day | ORAL | 12 refills | Status: DC

## 2017-07-02 NOTE — Progress Notes (Signed)
INITIAL OBSTETRICAL VISIT Patient name: Michelle Page MRN [id number]  Date of birth: 1976-03-21 Chief Complaint:   Initial Prenatal Visit  History of Present Illness:   Michelle Page is a 42 y.o. [id number] Caucasian female at 42w0dby LMP c/w 7wk u/s, with an Estimated Date of Delivery: 01/28/18 being seen today for her initial obstetrical visit.   Her obstetrical history is significant for AMA- turning 89yoin May, grand multiparity, h/o GHTN 1st pregnancy, h/o rapid labor.   Today she reports wants cfDNA testing.  Patient's last menstrual period was 04/23/2017. Last pap 05/29/17. Results were: neg w/ +HRHPV Review of Systems:   Pertinent items are noted in HPI Denies cramping/contractions, leakage of fluid, vaginal bleeding, abnormal vaginal discharge w/ itching/odor/irritation, headaches, visual changes, shortness of breath, chest pain, abdominal pain, severe nausea/vomiting, or problems with urination or bowel movements unless otherwise stated above.  Pertinent History Reviewed:  Reviewed past medical,surgical, social, obstetrical and family history.  Reviewed problem list, medications and allergies. OB History  Gravida Para Term Preterm AB Living  '8 6 6 ' 0 1 6  SAB TAB Ectopic Multiple Live Births    1   0 6    # Outcome Date GA Lbr Len/2nd Weight Sex Delivery Anes PTL Lv  8 Current           7 Term 12/07/15 327w5d3:05 8 lb 0.9 oz (3.655 kg) F Vag-Spont None N LIV  6 Term 04/08/12 3932w4d:44 / 00:11 8 lb 2.2 oz (3.69 kg) M Vag-Spont None N LIV  5 Term 07/22/10 39w93w1d20 8 lb 4 oz (3.742 kg) M Vag-Spont None N LIV  4 Term 08/09/08 40w349w3d0 7 lb 14 oz (3.572 kg) M Vag-Spont EPI N LIV  3 Term 10/31/03 60w0d65w0d 7 lb 2 oz (3.232 kg) F Vag-Spont EPI N LIV     Birth Comments:  Baby Resp. Distress / Jaundice  2 Term 07/14/99 [redacted]w[redacted]d 37w4d5 lb 4 oz (2.381 kg) M Vag-Spont EPI N LIV     Birth Comments: Elevated  BP  1 TAB 1999 [redacted]w[redacted]d   76w0dDEC     Physical Assessment:    Vitals:   07/02/17 1042  BP: 102/70  Pulse: 76  Weight: 204 lb (92.5 kg)  Body mass index is 38.55 kg/m.       Physical Examination:  General appearance - well appearing, and in no distress  Mental status - alert, oriented to person, place, and time  Psych:  She has a normal mood and affect  Skin - warm and dry, normal color, no suspicious lesions noted  Chest - effort normal, all lung fields clear to auscultation bilaterally  Heart - normal rate and regular rhythm  Abdomen - soft, nontender  Extremities:  No swelling or varicosities noted  Thin prep pap is not done  Fetal Heart Rate (bpm): +u/s via informal transabdominal u/s, +active fetus  Results for orders placed or performed in visit on 07/02/17 (from the past 24 hour(s))  POCT urinalysis dipstick   Collection Time: 07/02/17 11:09 AM  Result Value Ref Range   Color, UA     Clarity, UA     Glucose, UA neg    Bilirubin, UA     Ketones, UA neg    Spec Grav, UA  1.010 - 1.025   Blood, UA neg    pH, UA  5.0 - 8.0   Protein, UA neg    Urobilinogen, UA  0.2 or 1.0 E.U./dL   Nitrite, UA neg    Leukocytes, UA Negative Negative   Appearance     Odor      Assessment & Plan:  1) High-Risk Pregnancy [id number] at 61w0dwith an Estimated Date of Delivery: 01/28/18   2) Initial OB visit  3) AMA>turning 89yoin May, wants cfDNA testing, baby ASA @ 12wks  4) H/O GYMEB>RAXEASA @ 12wks  Meds:  Meds ordered this encounter  Medications  . prenatal vitamin w/FE, FA (PRENATAL 1 + 1) 27-1 MG TABS tablet    Sig: Take 1 tablet by mouth daily at 12 noon.    Dispense:  30 each    Refill:  12    Order Specific Question:   Supervising Provider    Answer:   EFlorian Buff[[id number]]    Initial labs obtained Continue prenatal vitamins Reviewed n/v relief measures and warning s/s to report Reviewed recommended weight gain based on pre-gravid BMI Encouraged well-balanced diet Genetic Screening discussed: wants cfDNA, will also get  AFP 2nd trimester Cystic fibrosis screening discussed declined Ultrasound discussed; fetal survey: requested CCNC completed>met w/ Jasmine  Follow-up: Return in about 4 weeks (around 07/30/2017) for HROB and Informaseq (labs).   Orders Placed This Encounter  Procedures  . GC/Chlamydia Probe Amp  . Urine Culture  . Obstetric Panel, Including HIV  . Urinalysis, Routine w reflex microscopic  . Pain Management Screening Profile (10S)  . POCT urinalysis dipstick    KRoma SchanzCNM, WPullman Regional Hospital3/08/2017 12:21 PM

## 2017-07-02 NOTE — Patient Instructions (Addendum)
Michelle Page, I greatly value your feedback.  If you receive a survey following your visit with [place] today, we appreciate you taking the time to fill it out.  Thanks, [name], CNM, WHNP-BC  Begin taking a 81mg  baby aspirin daily at 12 weeks of pregnancy (3/19) to decrease risk of preeclampsia during pregnancy    Nausea & Vomiting  Have saltine crackers or pretzels by your bed and eat a few bites before you raise your head out of bed in the morning  Eat small frequent meals throughout the day instead of large meals  Drink plenty of fluids throughout the day to stay hydrated, just don't drink a lot of fluids with your meals.  This can make your stomach fill up faster making you feel sick  Do not brush your teeth right after you eat  Products with real ginger are good for nausea, like ginger ale and ginger hard candy Make sure it says made with real ginger!  Sucking on sour candy like lemon heads is also good for nausea  If your prenatal vitamins make you nauseated, take them at night so you will sleep through the nausea  Sea Bands  If you feel like you need medicine for the nausea & vomiting please let us know  If you are unable to keep any fluids or food down please let us know   Constipation  Drink plenty of fluid, preferably water, throughout the day  Eat foods high in fiber such as fruits, vegetables, and grains  Exercise, such as walking, is a good way to keep your bowels regular  Drink warm fluids, especially warm prune juice, or decaf coffee  Eat a 1/2 cup of real oatmeal (not instant), 1/2 cup applesauce, and 1/2-1 cup warm prune juice every day  If needed, you may take Colace (docusate sodium) stool softener once or twice a day to help keep the stool soft. If you are pregnant, wait until you are out of your first trimester (12-14 weeks of pregnancy)  If you still are having problems with constipation, you may take Miralax once daily as needed to help keep your  bowels regular.  If you are pregnant, wait until you are out of your first trimester (12-14 weeks of pregnancy)   First Trimester of Pregnancy The first trimester of pregnancy is from week 1 until the end of week 12 (months 1 through 3). A week after a sperm fertilizes an egg, the egg will implant on the wall of the uterus. This embryo will begin to develop into a baby. Genes from you and your partner are forming the baby. The female genes determine whether the baby is a boy or a girl. At 6-8 weeks, the eyes and face are formed, and the heartbeat can be seen on ultrasound. At the end of 12 weeks, all the baby's organs are formed.  Now that you are pregnant, you will want to do everything you can to have a healthy baby. Two of the most important things are to get good prenatal care and to follow your health care provider's instructions. Prenatal care is all the medical care you receive before the baby's birth. This care will help prevent, find, and treat any problems during the pregnancy and childbirth. BODY CHANGES Your body goes through many changes during pregnancy. The changes vary from woman to woman.   You may gain or lose a couple of pounds at first.  You may feel sick to your stomach (nauseous) and throw  up (vomit). If the vomiting is uncontrollable, call your health care provider.  You may tire easily.  You may develop headaches that can be relieved by medicines approved by your health care provider.  You may urinate more often. Painful urination may mean you have a bladder infection.  You may develop heartburn as a result of your pregnancy.  You may develop constipation because certain hormones are causing the muscles that push waste through your intestines to slow down.  You may develop hemorrhoids or swollen, bulging veins (varicose veins).  Your breasts may begin to grow larger and become tender. Your nipples may stick out more, and the tissue that surrounds them (areola) may become  darker.  Your gums may bleed and may be sensitive to brushing and flossing.  Dark spots or blotches (chloasma, mask of pregnancy) may develop on your face. This will likely fade after the baby is born.  Your menstrual periods will stop.  You may have a loss of appetite.  You may develop cravings for certain kinds of food.  You may have changes in your emotions from day to day, such as being excited to be pregnant or being concerned that something may go wrong with the pregnancy and baby.  You may have more vivid and strange dreams.  You may have changes in your hair. These can include thickening of your hair, rapid growth, and changes in texture. Some women also have hair loss during or after pregnancy, or hair that feels dry or thin. Your hair will most likely return to normal after your baby is born. WHAT TO EXPECT AT YOUR PRENATAL VISITS During a routine prenatal visit:  You will be weighed to make sure you and the baby are growing normally.  Your blood pressure will be taken.  Your abdomen will be measured to track your baby's growth.  The fetal heartbeat will be listened to starting around week 10 or 12 of your pregnancy.  Test results from any previous visits will be discussed. Your health care provider may ask you:  How you are feeling.  If you are feeling the baby move.  If you have had any abnormal symptoms, such as leaking fluid, bleeding, severe headaches, or abdominal cramping.  If you have any questions. Other tests that may be performed during your first trimester include:  Blood tests to find your blood type and to check for the presence of any previous infections. They will also be used to check for low iron levels (anemia) and Rh antibodies. Later in the pregnancy, blood tests for diabetes will be done along with other tests if problems develop.  Urine tests to check for infections, diabetes, or protein in the urine.  An ultrasound to confirm the proper  growth and development of the baby.  An amniocentesis to check for possible genetic problems.  Fetal screens for spina bifida and Down syndrome.  You may need other tests to make sure you and the baby are doing well. HOME CARE INSTRUCTIONS  Medicines  Follow your health care provider's instructions regarding medicine use. Specific medicines may be either safe or unsafe to take during pregnancy.  Take your prenatal vitamins as directed.  If you develop constipation, try taking a stool softener if your health care provider approves. Diet  Eat regular, well-balanced meals. Choose a variety of foods, such as meat or vegetable-based protein, fish, milk and low-fat dairy products, vegetables, fruits, and whole grain breads and cereals. Your health care provider will help you determine  the amount of weight gain that is right for you.  Avoid raw meat and uncooked cheese. These carry germs that can cause birth defects in the baby.  Eating four or five small meals rather than three large meals a day may help relieve nausea and vomiting. If you start to feel nauseous, eating a few soda crackers can be helpful. Drinking liquids between meals instead of during meals also seems to help nausea and vomiting.  If you develop constipation, eat more high-fiber foods, such as fresh vegetables or fruit and whole grains. Drink enough fluids to keep your urine clear or pale yellow. Activity and Exercise  Exercise only as directed by your health care provider. Exercising will help you:  Control your weight.  Stay in shape.  Be prepared for labor and delivery.  Experiencing pain or cramping in the lower abdomen or low back is a good sign that you should stop exercising. Check with your health care provider before continuing normal exercises.  Try to avoid standing for long periods of time. Move your legs often if you must stand in one place for a long time.  Avoid heavy lifting.  Wear low-heeled  shoes, and practice good posture.  You may continue to have sex unless your health care provider directs you otherwise. Relief of Pain or Discomfort  Wear a good support bra for breast tenderness.   Take warm sitz baths to soothe any pain or discomfort caused by hemorrhoids. Use hemorrhoid cream if your health care provider approves.   Rest with your legs elevated if you have leg cramps or low back pain.  If you develop varicose veins in your legs, wear support hose. Elevate your feet for 15 minutes, 3-4 times a day. Limit salt in your diet. Prenatal Care  Schedule your prenatal visits by the twelfth week of pregnancy. They are usually scheduled monthly at first, then more often in the last 2 months before delivery.  Write down your questions. Take them to your prenatal visits.  Keep all your prenatal visits as directed by your health care provider. Safety  Wear your seat belt at all times when driving.  Make a list of emergency phone numbers, including numbers for family, friends, the hospital, and police and fire departments. General Tips  Ask your health care provider for a referral to a local prenatal education class. Begin classes no later than at the beginning of month 6 of your pregnancy.  Ask for help if you have counseling or nutritional needs during pregnancy. Your health care provider can offer advice or refer you to specialists for help with various needs.  Do not use hot tubs, steam rooms, or saunas.  Do not douche or use tampons or scented sanitary pads.  Do not cross your legs for long periods of time.  Avoid cat litter boxes and soil used by cats. These carry germs that can cause birth defects in the baby and possibly loss of the fetus by miscarriage or stillbirth.  Avoid all smoking, herbs, alcohol, and medicines not prescribed by your health care provider. Chemicals in these affect the formation and growth of the baby.  Schedule a dentist appointment. At  home, brush your teeth with a soft toothbrush and be gentle when you floss. SEEK MEDICAL CARE IF:   You have dizziness.  You have mild pelvic cramps, pelvic pressure, or nagging pain in the abdominal area.  You have persistent nausea, vomiting, or diarrhea.  You have a bad smelling vaginal discharge.  You have pain with urination.  You notice increased swelling in your face, hands, legs, or ankles. SEEK IMMEDIATE MEDICAL CARE IF:   You have a fever.  You are leaking fluid from your vagina.  You have spotting or bleeding from your vagina.  You have severe abdominal cramping or pain.  You have rapid weight gain or loss.  You vomit blood or material that looks like coffee grounds.  You are exposed to [place] measles and have never had them.  You are exposed to fifth disease or chickenpox.  You develop a severe headache.  You have shortness of breath.  You have any kind of trauma, such as from a fall or a car accident. Document Released: 04/10/2001 Document Revised: 08/31/2013 Document Reviewed: 02/24/2013 [facility] Patient Information 2015 [place], [place]. This information is not intended to replace advice given to you by your health care provider. Make sure you discuss any questions you have with your health care provider.

## 2017-07-03 LAB — PMP SCREEN PROFILE (10S), URINE
Amphetamine Scrn, Ur: NEGATIVE ng/mL
BARBITURATE SCREEN URINE: NEGATIVE ng/mL
BENZODIAZEPINE SCREEN, URINE: NEGATIVE ng/mL
CANNABINOIDS UR QL SCN: NEGATIVE ng/mL
Cocaine (Metab) Scrn, Ur: NEGATIVE ng/mL
Creatinine(Crt), U: 11 mg/dL — ABNORMAL LOW (ref 20.0–300.0)
Methadone Screen, Urine: NEGATIVE ng/mL
OXYCODONE+OXYMORPHONE UR QL SCN: NEGATIVE ng/mL
Opiate Scrn, Ur: NEGATIVE ng/mL
Ph of Urine: 6.4 (ref 4.5–8.9)
Phencyclidine Qn, Ur: NEGATIVE ng/mL
Propoxyphene Scrn, Ur: NEGATIVE ng/mL

## 2017-07-03 LAB — SPECIFIC GRAVITY (REFLEXED): SPECIFIC GRAVITY: 1.0018

## 2017-07-04 LAB — OBSTETRIC PANEL, INCLUDING HIV
Antibody Screen: NEGATIVE
Basophils Absolute: 0 10*3/uL (ref 0.0–0.2)
Basos: 0 %
EOS (ABSOLUTE): 0 10*3/uL (ref 0.0–0.4)
Eos: 0 %
HIV Screen 4th Generation wRfx: NONREACTIVE
Hematocrit: 37.2 % (ref 34.0–46.6)
Hemoglobin: 12.7 g/dL (ref 11.1–15.9)
Hepatitis B Surface Ag: NEGATIVE
Immature Grans (Abs): 0 10*3/uL (ref 0.0–0.1)
Immature Granulocytes: 0 %
Lymphocytes Absolute: 1.5 10*3/uL (ref 0.7–3.1)
Lymphs: 28 %
MCH: 30.2 pg (ref 26.6–33.0)
MCHC: 34.1 g/dL (ref 31.5–35.7)
MCV: 89 fL (ref 79–97)
Monocytes Absolute: 0.4 10*3/uL (ref 0.1–0.9)
Monocytes: 7 %
Neutrophils Absolute: 3.5 10*3/uL (ref 1.4–7.0)
Neutrophils: 65 %
Platelets: 202 10*3/uL (ref 150–379)
RBC: 4.2 x10E6/uL (ref 3.77–5.28)
RDW: 13.2 % (ref 12.3–15.4)
RPR Ser Ql: NONREACTIVE
Rh Factor: POSITIVE
Rubella Antibodies, IGG: 4.25 index (ref >0.99)
WBC: 5.4 10*3/uL (ref 3.4–10.8)

## 2017-07-04 LAB — URINALYSIS, ROUTINE W REFLEX MICROSCOPIC
Bilirubin, UA: NEGATIVE
Glucose, UA: NEGATIVE
Ketones, UA: NEGATIVE
Leukocytes, UA: NEGATIVE
Nitrite, UA: NEGATIVE
Protein, UA: NEGATIVE
RBC, UA: NEGATIVE
Specific Gravity, UA: <=1.005 — AB (ref 1.005–1.030)
Urobilinogen, Ur: 0.2 mg/dL (ref 0.2–1.0)
pH, UA: 7 (ref 5.0–7.5)

## 2017-07-04 LAB — URINE CULTURE: Organism ID, Bacteria: NO GROWTH

## 2017-07-04 LAB — GC/CHLAMYDIA PROBE AMP
Chlamydia trachomatis, NAA: NEGATIVE
Neisseria gonorrhoeae by PCR: NEGATIVE

## 2017-07-30 ENCOUNTER — Encounter: Payer: Self-pay | Admitting: Women's Health

## 2017-07-30 ENCOUNTER — Ambulatory Visit (INDEPENDENT_AMBULATORY_CARE_PROVIDER_SITE_OTHER): Payer: Medicaid Other | Admitting: Women's Health

## 2017-07-30 VITALS — BP 112/78 | HR 73 | Wt 207.0 lb

## 2017-07-30 DIAGNOSIS — O0992 Supervision of high risk pregnancy, unspecified, second trimester: Secondary | ICD-10-CM

## 2017-07-30 DIAGNOSIS — Z331 Pregnant state, incidental: Secondary | ICD-10-CM

## 2017-07-30 DIAGNOSIS — Z8759 Personal history of other complications of pregnancy, childbirth and the puerperium: Secondary | ICD-10-CM

## 2017-07-30 DIAGNOSIS — O09292 Supervision of pregnancy with other poor reproductive or obstetric history, second trimester: Secondary | ICD-10-CM

## 2017-07-30 DIAGNOSIS — Z1389 Encounter for screening for other disorder: Secondary | ICD-10-CM

## 2017-07-30 DIAGNOSIS — Z3A14 14 weeks gestation of pregnancy: Secondary | ICD-10-CM

## 2017-07-30 DIAGNOSIS — O09522 Supervision of elderly multigravida, second trimester: Secondary | ICD-10-CM

## 2017-07-30 LAB — POCT URINALYSIS DIPSTICK
Blood, UA: NEGATIVE
Glucose, UA: NEGATIVE
Ketones, UA: NEGATIVE
Leukocytes, UA: NEGATIVE
Nitrite, UA: NEGATIVE
Protein, UA: NEGATIVE

## 2017-07-30 NOTE — Patient Instructions (Signed)
Michelle Page, I greatly value your feedback.  If you receive a survey following your visit with [place] today, we appreciate you taking the time to fill it out.  Thanks, Michelle Page, CNM, WHNP-BC   Second Trimester of Pregnancy The second trimester is from week 14 through week 27 (months 4 through 6). The second trimester is often a time when you feel your best. Your body has adjusted to being pregnant, and you begin to feel better physically. Usually, morning sickness has lessened or quit completely, you may have more energy, and you may have an increase in appetite. The second trimester is also a time when the fetus is growing rapidly. At the end of the sixth month, the fetus is about 9 inches long and weighs about 1 pounds. You will likely begin to feel the baby move (quickening) between 16 and 20 weeks of pregnancy. Body changes during your second trimester Your body continues to go through many changes during your second trimester. The changes vary from woman to woman.  Your weight will continue to increase. You will notice your lower abdomen bulging out.  You may begin to get stretch marks on your hips, abdomen, and breasts.  You may develop headaches that can be relieved by medicines. The medicines should be approved by your health care provider.  You may urinate more often because the fetus is pressing on your bladder.  You may develop or continue to have heartburn as a result of your pregnancy.  You may develop constipation because certain hormones are causing the muscles that push waste through your intestines to slow down.  You may develop hemorrhoids or swollen, bulging veins (varicose veins).  You may have back pain. This is caused by: ? Weight gain. ? Pregnancy hormones that are relaxing the joints in your pelvis. ? A shift in weight and the muscles that support your balance.  Your breasts will continue to grow and they will continue to become tender.  Your gums may bleed  and may be sensitive to brushing and flossing.  Dark spots or blotches (chloasma, mask of pregnancy) may develop on your face. This will likely fade after the baby is born.  A dark line from your belly button to the pubic area (linea nigra) may appear. This will likely fade after the baby is born.  You may have changes in your hair. These can include thickening of your hair, rapid growth, and changes in texture. Some women also have hair loss during or after pregnancy, or hair that feels dry or thin. Your hair will most likely return to normal after your baby is born.  What to expect at prenatal visits During a routine prenatal visit:  You will be weighed to make sure you and the fetus are growing normally.  Your blood pressure will be taken.  Your abdomen will be measured to track your baby's growth.  The fetal heartbeat will be listened to.  Any test results from the previous visit will be discussed.  Your health care provider may ask you:  How you are feeling.  If you are feeling the baby move.  If you have had any abnormal symptoms, such as leaking fluid, bleeding, severe headaches, or abdominal cramping.  If you are using any tobacco products, including cigarettes, chewing tobacco, and electronic cigarettes.  If you have any questions.  Other tests that may be performed during your second trimester include:  Blood tests that check for: ? Low iron levels (anemia). ? High  blood sugar that affects pregnant women (gestational diabetes) between 3 and 28 weeks. ? Rh antibodies. This is to check for a protein on red blood cells (Rh factor).  Urine tests to check for infections, diabetes, or protein in the urine.  An ultrasound to confirm the proper growth and development of the baby.  An amniocentesis to check for possible genetic problems.  Fetal screens for spina bifida and Down syndrome.  HIV (human immunodeficiency virus) testing. Routine prenatal testing includes  screening for HIV, unless you choose not to have this test.  Follow these instructions at home: Medicines  Follow your health care provider's instructions regarding medicine use. Specific medicines may be either safe or unsafe to take during pregnancy.  Take a prenatal vitamin that contains at least 600 micrograms (mcg) of folic acid.  If you develop constipation, try taking a stool softener if your health care provider approves. Eating and drinking  Eat a balanced diet that includes fresh fruits and vegetables, whole grains, good sources of protein such as meat, eggs, or tofu, and low-fat dairy. Your health care provider will help you determine the amount of weight gain that is right for you.  Avoid raw meat and uncooked cheese. These carry germs that can cause birth defects in the baby.  If you have low calcium intake from food, talk to your health care provider about whether you should take a daily calcium supplement.  Limit foods that are high in fat and processed sugars, such as fried and sweet foods.  To prevent constipation: ? Drink enough fluid to keep your urine clear or pale yellow. ? Eat foods that are high in fiber, such as fresh fruits and vegetables, whole grains, and beans. Activity  Exercise only as directed by your health care provider. Most women can continue their usual exercise routine during pregnancy. Try to exercise for 30 minutes at least 5 days a week. Stop exercising if you experience uterine contractions.  Avoid heavy lifting, wear low heel shoes, and practice good posture.  A sexual relationship may be continued unless your health care provider directs you otherwise. Relieving pain and discomfort  Wear a good support bra to prevent discomfort from breast tenderness.  Take warm sitz baths to soothe any pain or discomfort caused by hemorrhoids. Use hemorrhoid cream if your health care provider approves.  Rest with your legs elevated if you have leg cramps  or low back pain.  If you develop varicose veins, wear support hose. Elevate your feet for 15 minutes, 3-4 times a day. Limit salt in your diet. Prenatal Care  Write down your questions. Take them to your prenatal visits.  Keep all your prenatal visits as told by your health care provider. This is important. Safety  Wear your seat belt at all times when driving.  Make a list of emergency phone numbers, including numbers for family, friends, the hospital, and police and fire departments. General instructions  Ask your health care provider for a referral to a local prenatal education class. Begin classes no later than the beginning of month 6 of your pregnancy.  Ask for help if you have counseling or nutritional needs during pregnancy. Your health care provider can offer advice or refer you to specialists for help with various needs.  Do not use hot tubs, steam rooms, or saunas.  Do not douche or use tampons or scented sanitary pads.  Do not cross your legs for long periods of time.  Avoid cat litter boxes and soil  used by cats. These carry germs that can cause birth defects in the baby and possibly loss of the fetus by miscarriage or stillbirth.  Avoid all smoking, herbs, alcohol, and unprescribed drugs. Chemicals in these products can affect the formation and growth of the baby.  Do not use any products that contain nicotine or tobacco, such as cigarettes and e-cigarettes. If you need help quitting, ask your health care provider.  Visit your dentist if you have not gone yet during your pregnancy. Use a soft toothbrush to brush your teeth and be gentle when you floss. Contact a health care provider if:  You have dizziness.  You have mild pelvic cramps, pelvic pressure, or nagging pain in the abdominal area.  You have persistent nausea, vomiting, or diarrhea.  You have a bad smelling vaginal discharge.  You have pain when you urinate. Get help right away if:  You have a  fever.  You are leaking fluid from your vagina.  You have spotting or bleeding from your vagina.  You have severe abdominal cramping or pain.  You have rapid weight gain or weight loss.  You have shortness of breath with chest pain.  You notice sudden or extreme swelling of your face, hands, ankles, feet, or legs.  You have not felt your baby move in over an hour.  You have severe headaches that do not go away when you take medicine.  You have vision changes. Summary  The second trimester is from week 14 through week 27 (months 4 through 6). It is also a time when the fetus is growing rapidly.  Your body goes through many changes during pregnancy. The changes vary from woman to woman.  Avoid all smoking, herbs, alcohol, and unprescribed drugs. These chemicals affect the formation and growth your baby.  Do not use any tobacco products, such as cigarettes, chewing tobacco, and e-cigarettes. If you need help quitting, ask your health care provider.  Contact your health care provider if you have any questions. Keep all prenatal visits as told by your health care provider. This is important. This information is not intended to replace advice given to you by your health care provider. Make sure you discuss any questions you have with your health care provider. Document Released: 04/10/2001 Document Revised: 09/22/2015 Document Reviewed: 06/17/2012 Elsevier Interactive Patient Education  2017 [employer].

## 2017-07-30 NOTE — Progress Notes (Signed)
HIGH-RISK PREGNANCY VISIT Patient name: Michelle Page MRN [id number]  Date of birth: 02-25-1976 Chief Complaint:   High Risk Gestation (Informaseq lab today)  History of Present Illness:   Michelle Page is a 42 y.o. [id number] female at [redacted]w[redacted]d with an Estimated Date of Delivery: 01/28/18 being seen today for ongoing management of a high-risk pregnancy complicated by AMA-turns 42yo next month.  Today she reports no complaints. Thinking about potentially switching to birthing center for bigger beds and b/c 'labors are always so fast at hospital', but she is not sure she really wants to. Discussed location of birth has nothing to do w/ rapidness of labor.  . Vag. Bleeding: None.  Movement: Absent. denies leaking of fluid.  Review of Systems:   Pertinent items are noted in HPI Denies abnormal vaginal discharge w/ itching/odor/irritation, headaches, visual changes, shortness of breath, chest pain, abdominal pain, severe nausea/vomiting, or problems with urination or bowel movements unless otherwise stated above. Pertinent History Reviewed:  Reviewed past medical,surgical, social, obstetrical and family history.  Reviewed problem list, medications and allergies. Physical Assessment:   Vitals:   07/30/17 0843  BP: 112/78  Pulse: 73  Weight: 207 lb (93.9 kg)  Body mass index is 39.11 kg/m.           Physical Examination:   General appearance: alert, well appearing, and in no distress  Mental status: alert, oriented to person, place, and time  Skin: warm & dry   Extremities: Edema: None    Cardiovascular: normal heart rate noted  Respiratory: normal respiratory effort, no distress  Abdomen: gravid, soft, non-tender  Pelvic: Cervical exam deferred         Fetal Status: Fetal Heart Rate (bpm): +u/s   Movement: Absent    Fetal Surveillance Testing today: unable to obtaine FHT w/ doppler, informal bedside u/s w/ +FCA and active fetus   Results for orders placed or performed in visit on  07/30/17 (from the past 24 hour(s))  POCT urinalysis dipstick   Collection Time: 07/30/17  8:43 AM  Result Value Ref Range   Color, UA     Clarity, UA     Glucose, UA neg    Bilirubin, UA     Ketones, UA neg    Spec Grav, UA  1.010 - 1.025   Blood, UA neg    pH, UA  5.0 - 8.0   Protein, UA neg    Urobilinogen, UA  0.2 or 1.0 E.U./dL   Nitrite, UA neg    Leukocytes, UA Negative Negative   Appearance     Odor      Assessment & Plan:  1) High-risk pregnancy [id number] at [redacted]w[redacted]d with an Estimated Date of Delivery: 01/28/18   2) AMA, turns 42yo next month, Informaseq today, AFP next visit, continue baby ASA  3) H/O GHTN, continue baby asa  Meds: No orders of the defined types were placed in this encounter.   Labs/procedures today: Informaseq  Treatment Plan:  U/S @ 20, 24, 28, 32, 36wks    2x/wk testing nst/sono @ 36wks    Deliver @ 40wks  Reviewed: Preterm labor symptoms and general obstetric precautions including but not limited to vaginal bleeding, contractions, leaking of fluid and fetal movement were reviewed in detail with the patient.  All questions were answered.  Follow-up: Return in about 1 month (around 08/27/2017) for HROB, [id number]:GGEZMOQ & AFP (lab).  Orders Placed This Encounter  Procedures  . informaSeq(SM) with XY Analysis  . POCT urinalysis dipstick  [place], [facility] 07/30/2017 9:26 AM

## 2017-08-05 LAB — INFORMASEQ(SM) WITH XY ANALYSIS
Fetal Fraction (%):: 9.4
Fetal Number: 1
Gestational Age at Collection: 14 weeks
Weight: 207 [lb_av]

## 2017-08-06 ENCOUNTER — Telehealth: Payer: Self-pay | Admitting: Women's Health

## 2017-08-06 NOTE — Telephone Encounter (Signed)
Informed patient of informaseq results. Pt had no further questions or concerns.

## 2017-08-13 ENCOUNTER — Encounter (HOSPITAL_COMMUNITY): Payer: Self-pay | Admitting: Emergency Medicine

## 2017-08-13 ENCOUNTER — Telehealth: Payer: Self-pay | Admitting: *Deleted

## 2017-08-13 ENCOUNTER — Observation Stay (HOSPITAL_COMMUNITY)
Admission: EM | Admit: 2017-08-13 | Discharge: 2017-08-15 | Disposition: A | Discharge status: Home / Self Care | Payer: Medicaid Other | Attending: Surgery | Admitting: Surgery

## 2017-08-13 ENCOUNTER — Other Ambulatory Visit: Payer: Self-pay

## 2017-08-13 DIAGNOSIS — R1031 Right lower quadrant pain: Secondary | ICD-10-CM

## 2017-08-13 DIAGNOSIS — Z79899 Other long term (current) drug therapy: Secondary | ICD-10-CM | POA: Diagnosis not present

## 2017-08-13 DIAGNOSIS — O99612 Diseases of the digestive system complicating pregnancy, second trimester: Principal | ICD-10-CM | POA: Insufficient documentation

## 2017-08-13 DIAGNOSIS — O4422 Partial placenta previa NOS or without hemorrhage, second trimester: Secondary | ICD-10-CM | POA: Insufficient documentation

## 2017-08-13 DIAGNOSIS — F329 Major depressive disorder, single episode, unspecified: Secondary | ICD-10-CM | POA: Insufficient documentation

## 2017-08-13 DIAGNOSIS — K3533 Acute appendicitis with perforation and localized peritonitis, with abscess: Secondary | ICD-10-CM | POA: Insufficient documentation

## 2017-08-13 DIAGNOSIS — Z3A26 26 weeks gestation of pregnancy: Secondary | ICD-10-CM | POA: Insufficient documentation

## 2017-08-13 DIAGNOSIS — F419 Anxiety disorder, unspecified: Secondary | ICD-10-CM | POA: Insufficient documentation

## 2017-08-13 DIAGNOSIS — Z85820 Personal history of malignant melanoma of skin: Secondary | ICD-10-CM | POA: Insufficient documentation

## 2017-08-13 DIAGNOSIS — K3580 Unspecified acute appendicitis: Secondary | ICD-10-CM | POA: Diagnosis present

## 2017-08-13 DIAGNOSIS — O99342 Other mental disorders complicating pregnancy, second trimester: Secondary | ICD-10-CM | POA: Diagnosis not present

## 2017-08-13 DIAGNOSIS — Z7982 Long term (current) use of aspirin: Secondary | ICD-10-CM | POA: Diagnosis not present

## 2017-08-13 DIAGNOSIS — O09522 Supervision of elderly multigravida, second trimester: Secondary | ICD-10-CM | POA: Diagnosis not present

## 2017-08-13 DIAGNOSIS — O99212 Obesity complicating pregnancy, second trimester: Secondary | ICD-10-CM | POA: Insufficient documentation

## 2017-08-13 DIAGNOSIS — E669 Obesity, unspecified: Secondary | ICD-10-CM | POA: Diagnosis not present

## 2017-08-13 DIAGNOSIS — R112 Nausea with vomiting, unspecified: Secondary | ICD-10-CM

## 2017-08-13 DIAGNOSIS — O162 Unspecified maternal hypertension, second trimester: Secondary | ICD-10-CM | POA: Insufficient documentation

## 2017-08-13 DIAGNOSIS — Z85828 Personal history of other malignant neoplasm of skin: Secondary | ICD-10-CM | POA: Insufficient documentation

## 2017-08-13 DIAGNOSIS — Z87891 Personal history of nicotine dependence: Secondary | ICD-10-CM | POA: Diagnosis not present

## 2017-08-13 LAB — COMPREHENSIVE METABOLIC PANEL
ALT: 24 U/L (ref 14–54)
AST: 23 U/L (ref 15–41)
Albumin: 3.3 g/dL — ABNORMAL LOW (ref 3.5–5.0)
Alkaline Phosphatase: 48 U/L (ref 38–126)
Anion gap: 9 (ref 5–15)
BUN: 9 mg/dL (ref 6–20)
CO2: 20 mmol/L — ABNORMAL LOW (ref 22–32)
Calcium: 9 mg/dL (ref 8.9–10.3)
Chloride: 107 mmol/L (ref 101–111)
Creatinine, Ser: 0.46 mg/dL (ref 0.44–1.00)
GFR calc Af Amer: >60 mL/min (ref >60)
GFR calc non Af Amer: >60 mL/min (ref >60)
Glucose, Bld: 108 mg/dL — ABNORMAL HIGH (ref 65–99)
Potassium: 3.7 mmol/L (ref 3.5–5.1)
Sodium: 136 mmol/L (ref 135–145)
Total Bilirubin: 0.3 mg/dL (ref 0.3–1.2)
Total Protein: 6.5 g/dL (ref 6.5–8.1)

## 2017-08-13 LAB — PREGNANCY, URINE: Preg Test, Ur: POSITIVE — AB

## 2017-08-13 LAB — URINALYSIS, ROUTINE W REFLEX MICROSCOPIC
Bilirubin Urine: NEGATIVE
Glucose, UA: NEGATIVE mg/dL
Hgb urine dipstick: NEGATIVE
Ketones, ur: NEGATIVE mg/dL
Leukocytes, UA: NEGATIVE
Nitrite: NEGATIVE
Protein, ur: NEGATIVE mg/dL
Specific Gravity, Urine: 1.011 (ref 1.005–1.030)
pH: 7 (ref 5.0–8.0)

## 2017-08-13 LAB — CBC
HCT: 34.8 % — ABNORMAL LOW (ref 36.0–46.0)
Hemoglobin: 11.7 g/dL — ABNORMAL LOW (ref 12.0–15.0)
MCH: 29.8 pg (ref 26.0–34.0)
MCHC: 33.6 g/dL (ref 30.0–36.0)
MCV: 88.8 fL (ref 78.0–100.0)
Platelets: 178 10*3/uL (ref 150–400)
RBC: 3.92 MIL/uL (ref 3.87–5.11)
RDW: 13.3 % (ref 11.5–15.5)
WBC: 10.6 10*3/uL — ABNORMAL HIGH (ref 4.0–10.5)

## 2017-08-13 LAB — LIPASE, BLOOD: Lipase: 29 U/L (ref 11–51)

## 2017-08-13 MED ORDER — DICYCLOMINE HCL 10 MG/ML IM SOLN
20.0000 mg | Freq: Once | INTRAMUSCULAR | Status: AC
  Administered 2017-08-13: 20 mg via INTRAMUSCULAR
  Filled 2017-08-13: qty 2

## 2017-08-13 MED ORDER — SODIUM CHLORIDE 0.9 % IV BOLUS
1000.0000 mL | Freq: Once | INTRAVENOUS | Status: AC
  Administered 2017-08-13: 1000 mL via INTRAVENOUS

## 2017-08-13 MED ORDER — SUCRALFATE 1 GM/10ML PO SUSP
1.0000 g | Freq: Three times a day (TID) | ORAL | Status: DC
  Administered 2017-08-13: 1 g via ORAL
  Filled 2017-08-13 (×2): qty 10

## 2017-08-13 MED ORDER — FENTANYL CITRATE (PF) 100 MCG/2ML IJ SOLN
50.0000 ug | Freq: Once | INTRAMUSCULAR | Status: AC
  Administered 2017-08-13: 50 ug via INTRAVENOUS
  Filled 2017-08-13: qty 2

## 2017-08-13 MED ORDER — ONDANSETRON HCL 4 MG/2ML IJ SOLN
4.0000 mg | Freq: Once | INTRAMUSCULAR | Status: AC
  Administered 2017-08-13: 4 mg via INTRAVENOUS
  Filled 2017-08-13: qty 2

## 2017-08-13 MED ORDER — MORPHINE SULFATE (PF) 4 MG/ML IV SOLN
4.0000 mg | Freq: Once | INTRAVENOUS | Status: AC
  Administered 2017-08-13: 4 mg via INTRAVENOUS
  Filled 2017-08-13: qty 1

## 2017-08-13 NOTE — ED Notes (Signed)
Dr Laverta [place] notified of emesis episode. Nausea persists.  Zofran ordered.

## 2017-08-13 NOTE — ED Triage Notes (Signed)
Pt c/o upper abd pain X4 hours, denies V/D/. Had a BM today without relief. [redacted] weeks pregnant, was given amoxicillin for a dental problem today.

## 2017-08-13 NOTE — ED Notes (Signed)
Report to [facility]

## 2017-08-13 NOTE — ED Notes (Signed)
Pt reports increased constant right flank pain and continued nausea, EDP Dr. Laverta Baltimore notified and at bedside at this time

## 2017-08-13 NOTE — Progress Notes (Signed)
Case discussed tonight, and I will be happy to see the patient in the morning at 8:30 in office.

## 2017-08-13 NOTE — ED Provider Notes (Addendum)
Emergency Department Provider Note   I have reviewed the triage vital signs and the nursing notes.   HISTORY  Chief Complaint Abdominal Pain   HPI Michelle Page is a 42 y.o. female [phone] currently [redacted] weeks pregnant presents to the emergency department for evaluation of acute onset epigastric and right lower quadrant abdominal pain.  She has no associated vomiting or diarrhea.  She was evaluated today for an unrelated dental pain and started on amoxicillin.  She is taken 1 dose of this and did not notice an obvious association with onset of pain.  Pain is nonradiating.  No fevers or shaking chills.  Denies any vaginal bleeding or discharge. No radiation of symptoms or modifying factors.    Past Medical History:  Diagnosis Date  . Abnormal Pap smear    colpo/leep  . Anemia   . Anxiety   . Cancer [facility]) 2006   Skin  . Chlamydia infection   . Depression   . Fractures   . H/O candidiasis   . H/O varicella   . Headache(784.0)    migraines as a child   . Heart murmur   . High risk HPV infection   . Papanicolaou smear of cervix with positive high risk human papilloma virus (HPV) test 06/03/2017  . Pregnancy induced hypertension    1st & 2nd pregnancy  . Pregnancy induced hypertension    1st & 2nd pregnancy   . Two vessel umbilical cord, antepartum 11/26/2011  . Vaginal Pap smear, abnormal   . Yeast infection     Patient Active Problem List   Diagnosis Date Noted  . Supervision of high risk pregnancy, antepartum 07/02/2017  . AMA (advanced maternal age) multigravida 35+ 07/02/2017  . History of gestational hypertension 07/02/2017  . Papanicolaou smear of cervix with positive high risk human papilloma virus (HPV) test 06/03/2017  . Chronic urticaria 01/29/2017  . Melanoma of face ([place]) 01/29/2017  . Abnormal Pap smear of cervix 01/29/2017  . Heart murmur   . High risk HPV infection   . Depression 09/12/2011  . Anemia 09/12/2011    Past Surgical History:    Procedure Laterality Date  . COLPOSCOPY    . LEEP    . WISDOM TOOTH EXTRACTION      Current Outpatient Rx  . Order #: [id number] Class: Historical Med  . Order #: [id number] Class: Normal    Allergies Patient has no known allergies.  Family History  Problem Relation Age of Onset  . Alcohol abuse Father   . Emphysema Father   . COPD Father   . Mental illness Father        PTSD  . Alcohol abuse Brother   . Stroke Brother   . COPD Paternal Grandmother   . Cancer Paternal Grandmother        skin cancer  . Heart disease Paternal Grandfather        enlarged heart  . Multiple sclerosis Sister   . Depression Mother   . Asthma Son     Social History Social History   Tobacco Use  . Smoking status: Former Smoker    Types: Cigarettes  . Smokeless tobacco: Never Used  Substance Use Topics  . Alcohol use: No    Frequency: Never    Comment: occ before pregnancy  . Drug use: No    Review of Systems  Constitutional: No fever/chills Eyes: No visual changes. ENT: No sore throat. Cardiovascular: Denies chest pain. Respiratory: Denies shortness of breath. Gastrointestinal: Positive abdominal pain.  No nausea, no vomiting.  No diarrhea.  No constipation. Genitourinary: Negative for dysuria. Musculoskeletal: Negative for back pain. Skin: Negative for rash. Neurological: Negative for headaches, focal weakness or numbness.  10-point ROS otherwise negative.  ____________________________________________   PHYSICAL EXAM:  VITAL SIGNS: ED Triage Vitals [08/13/17 1856]  Enc Vitals Group     BP 138/83     Pulse Rate 86     Resp 17     Temp 97.9 F (36.6 C)     Temp Source Oral     SpO2 99 %     Weight 210 lb (95.3 kg)     Height 5\' 4"  (1.626 m)     Pain Score 8   Constitutional: Alert and oriented. Well appearing and in no acute distress. Eyes: Conjunctivae are normal. Head: Atraumatic. Nose: No congestion/rhinnorhea. Mouth/Throat: Mucous membranes are  moist. Neck: No stridor.   Cardiovascular: Normal rate, regular rhythm. Good peripheral circulation. Grossly normal heart sounds.   Respiratory: Normal respiratory effort.  No retractions. Lungs CTAB. Gastrointestinal: Soft with mild RLQ and epigastric tenderness to palpation. No rebound or guarding. Musculoskeletal: No lower extremity tenderness nor edema. No gross deformities of extremities. Neurologic:  Normal speech and language. No gross focal neurologic deficits are appreciated.  Skin:  Skin is warm, dry and intact. No rash noted.   ____________________________________________   LABS (all labs ordered are listed, but only abnormal results are displayed)  Labs Reviewed  COMPREHENSIVE METABOLIC PANEL - Abnormal; Notable for the following components:      Result Value   CO2 20 (*)    Glucose, Bld 108 (*)    Albumin 3.3 (*)    All other components within normal limits  CBC - Abnormal; Notable for the following components:   WBC 10.6 (*)    Hemoglobin 11.7 (*)    HCT 34.8 (*)    All other components within normal limits  URINALYSIS, ROUTINE W REFLEX MICROSCOPIC - Abnormal; Notable for the following components:   Color, Urine STRAW (*)    All other components within normal limits  PREGNANCY, URINE - Abnormal; Notable for the following components:   Preg Test, Ur POSITIVE (*)    All other components within normal limits  LIPASE, BLOOD   ____________________________________________  RADIOLOGY  Reviewed 7W [place] from 06/12/17  MRI abdomen pending ____________________________________________   PROCEDURES  Procedure(s) performed:   .Critical Care Performed by: Margette Fast, MD Authorized by: Margette Fast, MD   Critical care provider statement:    Critical care time (minutes):  35   Critical care time was exclusive of:  Separately billable procedures and treating other patients and teaching time   Critical care was necessary to treat or prevent imminent or  life-threatening deterioration of the following conditions:  Dehydration (Acute appendicitis )   Critical care was time spent personally by me on the following activities:  Blood draw for specimens, discussions with consultants, discussions with primary provider, evaluation of patient's response to treatment, examination of patient, obtaining history from patient or surrogate, ordering and performing treatments and interventions, ordering and review of laboratory studies, ordering and review of radiographic studies, pulse oximetry, review of old charts and re-evaluation of patient's condition   I assumed direction of critical care for this patient from another provider in my specialty: no      ____________________________________________   INITIAL IMPRESSION / [place] / ED COURSE  Pertinent labs & imaging results that were available during my care of the  patient were reviewed by me and considered in my medical decision making (see chart for details).  Patient presents to the emergency department for evaluation of acute onset epigastric pain with some associated right lower quadrant discomfort.  There is some mild tenderness in the right lower quadrant without guarding or rebound.  Patient with no abdominal surgery history.  Plan for labs, Carafate, and reassess.   Differential diagnosis includes but is not exclusive to ectopic pregnancy, ovarian cyst, ovarian torsion, acute appendicitis, urinary tract infection, endometriosis, bowel obstruction, hernia, colitis, renal colic, gastroenteritis, volvulus etc.  08:40 PM Spoke with Dr. Glo Herring with OB. Patient now with more vomiting and epigastric symptoms. He offered to evaluate the patient in the office tomorrow at 8:30 AM if she is feeling well enough to be discharged.   09:50 PM Patient had complaining of increasing right lower quadrant abdominal pain.  Her epigastric abdominal discomfort is less severe.  She continues to have  frequent vomiting.  At this point I feel the patient would benefit from urgent MRI and am not comfortable with waiting until tomorrow.  I performed a bedside ultrasound in which I was able to visualize a normal intrauterine pregnancy with fetal heart activity.  Patient is not experiencing any vaginal bleeding or discharge to make me think this is pelvic in etiology.  I do not have formal ultrasound available to me at this hour.  I discussed the case with Dr. Jeanell Sparrow at [facility] who agrees to accept the patient in transfer for abdominal MRI to rule out appendicitis.  ____________________________________________  FINAL CLINICAL IMPRESSION(S) / ED DIAGNOSES  Final diagnoses:  RLQ abdominal pain     MEDICATIONS GIVEN DURING THIS VISIT:  Medications  sucralfate (CARAFATE) 1 GM/10ML suspension 1 g ( Oral Canceled Entry 08/13/17 2109)  sodium chloride 0.9 % bolus 1,000 mL (0 mLs Intravenous Stopped 08/13/17 2104)  ondansetron (ZOFRAN) injection 4 mg (4 mg Intravenous Given 08/13/17 2044)  dicyclomine (BENTYL) injection 20 mg (20 mg Intramuscular Given 08/13/17 2115)  fentaNYL (SUBLIMAZE) injection 50 mcg (50 mcg Intravenous Given 08/13/17 2140)  morphine 4 MG/ML injection 4 mg (4 mg Intravenous Given 08/13/17 2233)    Note:  This document was prepared using Dragon voice recognition software and may include unintentional dictation errors.  Nanda Quinton, MD Emergency Medicine    Joni Colegrove, [facility], MD 08/13/17 4825    Margette Fast, MD 08/22/17 [phone]

## 2017-08-13 NOTE — Telephone Encounter (Signed)
Left message letting pt know Amoxicillin was safe to take during pregnancy. Advised to take with food. [place]

## 2017-08-13 NOTE — ED Notes (Signed)
PT departure via [facility]

## 2017-08-13 NOTE — ED Notes (Signed)
Pt stated her OB is [facility]. Dr Laverta Baltimore notified.

## 2017-08-13 NOTE — ED Provider Notes (Signed)
Patient transferred here from sister facility for MRI of the abdomen.  Has been having epigastric and RLQ pain today.  After treatment in other ED,  More prominent pain in RLQ now.  She is [redacted] weeks gestation with confirmed IUP by [place] 08/02/17.  No vaginal bleeding, discharge, etc.    Plan:  After discussion with OB, patient transferred here for MRI abdomen to assess for appendicitis.  If negative, can be d/c to follow-up with OB at 8:30AM tomorrow.  Results for orders placed or performed during the hospital encounter of 08/13/17  Lipase, blood  Result Value Ref Range   Lipase 29 11 - 51 U/L  Comprehensive metabolic panel  Result Value Ref Range   Sodium 136 135 - 145 mmol/L   Potassium 3.7 3.5 - 5.1 mmol/L   Chloride 107 101 - 111 mmol/L   CO2 20 (L) 22 - 32 mmol/L   Glucose, Bld 108 (H) 65 - 99 mg/dL   BUN 9 6 - 20 mg/dL   Creatinine, Ser 0.46 0.44 - 1.00 mg/dL   Calcium 9.0 8.9 - 10.3 mg/dL   Total Protein 6.5 6.5 - 8.1 g/dL   Albumin 3.3 (L) 3.5 - 5.0 g/dL   AST 23 15 - 41 U/L   ALT 24 14 - 54 U/L   Alkaline Phosphatase 48 38 - 126 U/L   Total Bilirubin 0.3 0.3 - 1.2 mg/dL   GFR calc non Af Amer >60 >60 mL/min   GFR calc Af Amer >60 >60 mL/min   Anion gap 9 5 - 15  CBC  Result Value Ref Range   WBC 10.6 (H) 4.0 - 10.5 K/uL   RBC 3.92 3.87 - 5.11 MIL/uL   Hemoglobin 11.7 (L) 12.0 - 15.0 g/dL   HCT 34.8 (L) 36.0 - 46.0 %   MCV 88.8 78.0 - 100.0 fL   MCH 29.8 26.0 - 34.0 pg   MCHC 33.6 30.0 - 36.0 g/dL   RDW 13.3 11.5 - 15.5 %   Platelets 178 150 - 400 K/uL  Urinalysis, Routine w reflex microscopic  Result Value Ref Range   Color, Urine STRAW (A) YELLOW   APPearance CLEAR CLEAR   Specific Gravity, Urine 1.011 1.005 - 1.030   pH 7.0 5.0 - 8.0   Glucose, UA NEGATIVE NEGATIVE mg/dL   Hgb urine dipstick NEGATIVE NEGATIVE   Bilirubin Urine NEGATIVE NEGATIVE   Ketones, ur NEGATIVE NEGATIVE mg/dL   Protein, ur NEGATIVE NEGATIVE mg/dL   Nitrite NEGATIVE NEGATIVE   Leukocytes,  UA NEGATIVE NEGATIVE  Pregnancy, urine  Result Value Ref Range   Preg Test, Ur POSITIVE (A) NEGATIVE   No results found.   MRI with preliminary read of early appendicitis.  Discussed with Dr. Rosendo Gros-- he will come speak with patient about options, she will require admission.  Has already been given zosyn.  4:37 AM Dr. Rosendo Gros has spoken with patient about her options.  Patient calling family members before deciding.  He will admit for ongoing care.   Larene Pickett, PA-C 08/14/17 [place], Delice Bison, DO 08/14/17 [phone]

## 2017-08-13 NOTE — ED Notes (Signed)
Pt to [facility] from [facility] via [facility] for MRI ruleout of appendicitis. NAD, VSS.

## 2017-08-13 NOTE — ED Notes (Signed)
FHR: 145bpm

## 2017-08-14 ENCOUNTER — Observation Stay (HOSPITAL_COMMUNITY): Payer: Medicaid Other | Admitting: Anesthesiology

## 2017-08-14 ENCOUNTER — Encounter (HOSPITAL_COMMUNITY): Payer: Self-pay | Admitting: Orthopedic Surgery

## 2017-08-14 ENCOUNTER — Encounter: Payer: Self-pay | Admitting: Women's Health

## 2017-08-14 ENCOUNTER — Emergency Department (HOSPITAL_COMMUNITY): Payer: Medicaid Other

## 2017-08-14 ENCOUNTER — Encounter (HOSPITAL_COMMUNITY)
Admission: EM | Disposition: A | Discharge status: Home / Self Care | Payer: Self-pay | Source: Home / Self Care | Attending: Emergency Medicine

## 2017-08-14 DIAGNOSIS — K3533 Acute appendicitis with perforation and localized peritonitis, with abscess: Secondary | ICD-10-CM | POA: Diagnosis not present

## 2017-08-14 DIAGNOSIS — Z9049 Acquired absence of other specified parts of digestive tract: Secondary | ICD-10-CM | POA: Insufficient documentation

## 2017-08-14 DIAGNOSIS — F419 Anxiety disorder, unspecified: Secondary | ICD-10-CM | POA: Diagnosis not present

## 2017-08-14 DIAGNOSIS — K3580 Unspecified acute appendicitis: Secondary | ICD-10-CM | POA: Diagnosis present

## 2017-08-14 DIAGNOSIS — O99612 Diseases of the digestive system complicating pregnancy, second trimester: Secondary | ICD-10-CM | POA: Diagnosis not present

## 2017-08-14 DIAGNOSIS — O99342 Other mental disorders complicating pregnancy, second trimester: Secondary | ICD-10-CM | POA: Diagnosis not present

## 2017-08-14 HISTORY — PX: LAPAROSCOPIC APPENDECTOMY: SHX408

## 2017-08-14 HISTORY — PX: APPENDECTOMY: SHX54

## 2017-08-14 SURGERY — APPENDECTOMY, LAPAROSCOPIC
Anesthesia: General | Site: Abdomen

## 2017-08-14 MED ORDER — ACETAMINOPHEN 325 MG PO TABS
ORAL_TABLET | ORAL | Status: AC
  Filled 2017-08-14: qty 2

## 2017-08-14 MED ORDER — OXYCODONE-ACETAMINOPHEN 5-325 MG PO TABS
1.0000 | ORAL_TABLET | ORAL | Status: DC | PRN
Start: 2017-08-14 — End: 2017-08-15
  Administered 2017-08-15: 1 via ORAL
  Filled 2017-08-14: qty 1

## 2017-08-14 MED ORDER — ONDANSETRON HCL 4 MG/2ML IJ SOLN
INTRAMUSCULAR | Status: DC | PRN
  Administered 2017-08-14: 4 mg via INTRAVENOUS

## 2017-08-14 MED ORDER — SUCCINYLCHOLINE CHLORIDE 200 MG/10ML IV SOSY
PREFILLED_SYRINGE | INTRAVENOUS | Status: AC
  Filled 2017-08-14: qty 10

## 2017-08-14 MED ORDER — BUPIVACAINE-EPINEPHRINE (PF) 0.5% -1:200000 IJ SOLN
INTRAMUSCULAR | Status: AC
  Filled 2017-08-14: qty 30

## 2017-08-14 MED ORDER — SODIUM CHLORIDE 0.9 % IR SOLN
Status: DC | PRN
  Administered 2017-08-14: 1000 mL

## 2017-08-14 MED ORDER — STERILE WATER FOR IRRIGATION IR SOLN
Status: DC | PRN
  Administered 2017-08-14: 1000 mL

## 2017-08-14 MED ORDER — HYDROMORPHONE HCL 2 MG/ML IJ SOLN: 0.2500 mg | INTRAMUSCULAR | Status: DC | PRN

## 2017-08-14 MED ORDER — LIDOCAINE 2% (20 MG/ML) 5 ML SYRINGE
INTRAMUSCULAR | Status: DC | PRN
  Administered 2017-08-14: 60 mg via INTRAVENOUS

## 2017-08-14 MED ORDER — EPHEDRINE 5 MG/ML INJ
INTRAVENOUS | Status: AC
  Filled 2017-08-14: qty 10

## 2017-08-14 MED ORDER — ONDANSETRON HCL 4 MG/2ML IJ SOLN
INTRAMUSCULAR | Status: AC
  Filled 2017-08-14: qty 2

## 2017-08-14 MED ORDER — KCL IN DEXTROSE-NACL 40-5-0.9 MEQ/L-%-% IV SOLN
INTRAVENOUS | Status: DC
  Administered 2017-08-14 – 2017-08-15 (×3): via INTRAVENOUS
  Filled 2017-08-14 (×4): qty 1000

## 2017-08-14 MED ORDER — BUPIVACAINE-EPINEPHRINE 0.5% -1:200000 IJ SOLN
INTRAMUSCULAR | Status: DC | PRN
  Administered 2017-08-14: 30 mL

## 2017-08-14 MED ORDER — ACETAMINOPHEN 325 MG PO TABS
650.0000 mg | ORAL_TABLET | Freq: Once | ORAL | Status: AC
  Administered 2017-08-14: 650 mg via ORAL

## 2017-08-14 MED ORDER — ONDANSETRON HCL 4 MG/2ML IJ SOLN
4.0000 mg | Freq: Once | INTRAMUSCULAR | Status: AC
  Administered 2017-08-14: 4 mg via INTRAVENOUS

## 2017-08-14 MED ORDER — PHENYLEPHRINE 40 MCG/ML (10ML) SYRINGE FOR IV PUSH (FOR BLOOD PRESSURE SUPPORT)
PREFILLED_SYRINGE | INTRAVENOUS | Status: DC | PRN
  Administered 2017-08-14 (×3): 80 ug via INTRAVENOUS

## 2017-08-14 MED ORDER — PIPERACILLIN-TAZOBACTAM 3.375 G IVPB 30 MIN
3.3750 g | Freq: Once | INTRAVENOUS | Status: AC
Start: 2017-08-14 — End: 2017-08-14
  Administered 2017-08-14: 3.375 g via INTRAVENOUS
  Filled 2017-08-14: qty 50

## 2017-08-14 MED ORDER — NEOSTIGMINE METHYLSULFATE 10 MG/10ML IV SOLN
INTRAVENOUS | Status: DC | PRN
  Administered 2017-08-14: 4 mg via INTRAVENOUS

## 2017-08-14 MED ORDER — GLYCOPYRROLATE 0.2 MG/ML IJ SOLN
INTRAMUSCULAR | Status: DC | PRN
  Administered 2017-08-14: .7 mg via INTRAVENOUS

## 2017-08-14 MED ORDER — LIDOCAINE 2% (20 MG/ML) 5 ML SYRINGE
INTRAMUSCULAR | Status: AC
  Filled 2017-08-14: qty 5

## 2017-08-14 MED ORDER — LACTATED RINGERS IV SOLN
INTRAVENOUS | Status: DC
  Administered 2017-08-14 (×2): via INTRAVENOUS

## 2017-08-14 MED ORDER — ONDANSETRON HCL 4 MG/2ML IJ SOLN
4.0000 mg | Freq: Four times a day (QID) | INTRAMUSCULAR | Status: DC | PRN
  Filled 2017-08-14 (×2): qty 2

## 2017-08-14 MED ORDER — LACTATED RINGERS IV SOLN: INTRAVENOUS | Status: DC

## 2017-08-14 MED ORDER — METRONIDAZOLE IN NACL 5-0.79 MG/ML-% IV SOLN
500.0000 mg | Freq: Three times a day (TID) | INTRAVENOUS | Status: DC
  Administered 2017-08-14 – 2017-08-15 (×3): 500 mg via INTRAVENOUS
  Filled 2017-08-14 (×5): qty 100

## 2017-08-14 MED ORDER — ROCURONIUM BROMIDE 10 MG/ML (PF) SYRINGE
PREFILLED_SYRINGE | INTRAVENOUS | Status: DC | PRN
  Administered 2017-08-14: 20 mg via INTRAVENOUS

## 2017-08-14 MED ORDER — ONDANSETRON HCL 4 MG/2ML IJ SOLN
4.0000 mg | Freq: Once | INTRAMUSCULAR | Status: AC
Start: 2017-08-14 — End: 2017-08-14
  Administered 2017-08-14: 4 mg via INTRAVENOUS
  Filled 2017-08-14: qty 2

## 2017-08-14 MED ORDER — ONDANSETRON 4 MG PO TBDP: 4.0000 mg | ORAL_TABLET | Freq: Four times a day (QID) | ORAL | Status: DC | PRN

## 2017-08-14 MED ORDER — DEXAMETHASONE SODIUM PHOSPHATE 10 MG/ML IJ SOLN
INTRAMUSCULAR | Status: AC
  Filled 2017-08-14: qty 1

## 2017-08-14 MED ORDER — FENTANYL CITRATE (PF) 250 MCG/5ML IJ SOLN
INTRAMUSCULAR | Status: AC
  Filled 2017-08-14: qty 5

## 2017-08-14 MED ORDER — HYDROMORPHONE HCL 2 MG/ML IJ SOLN
1.0000 mg | INTRAMUSCULAR | Status: DC | PRN
  Administered 2017-08-14 (×2): 1 mg via INTRAVENOUS
  Filled 2017-08-14 (×2): qty 1

## 2017-08-14 MED ORDER — EPHEDRINE SULFATE-NACL 50-0.9 MG/10ML-% IV SOSY
PREFILLED_SYRINGE | INTRAVENOUS | Status: DC | PRN
  Administered 2017-08-14: 10 mg via INTRAVENOUS
  Administered 2017-08-14: 5 mg via INTRAVENOUS

## 2017-08-14 MED ORDER — PROPOFOL 10 MG/ML IV BOLUS
INTRAVENOUS | Status: DC | PRN
  Administered 2017-08-14: 20 mg via INTRAVENOUS
  Administered 2017-08-14: 30 mg via INTRAVENOUS
  Administered 2017-08-14: 150 mg via INTRAVENOUS

## 2017-08-14 MED ORDER — PROPOFOL 10 MG/ML IV BOLUS
INTRAVENOUS | Status: AC
  Filled 2017-08-14: qty 20

## 2017-08-14 MED ORDER — PROMETHAZINE HCL 25 MG/ML IJ SOLN: 6.2500 mg | INTRAMUSCULAR | Status: DC | PRN

## 2017-08-14 MED ORDER — DEXAMETHASONE SODIUM PHOSPHATE 10 MG/ML IJ SOLN
INTRAMUSCULAR | Status: DC | PRN
  Administered 2017-08-14: 4 mg via INTRAVENOUS

## 2017-08-14 MED ORDER — 0.9 % SODIUM CHLORIDE (POUR BTL) OPTIME
TOPICAL | Status: DC | PRN
  Administered 2017-08-14: 1000 mL

## 2017-08-14 MED ORDER — PHENYLEPHRINE 40 MCG/ML (10ML) SYRINGE FOR IV PUSH (FOR BLOOD PRESSURE SUPPORT)
PREFILLED_SYRINGE | INTRAVENOUS | Status: AC
  Filled 2017-08-14: qty 10

## 2017-08-14 MED ORDER — NEOSTIGMINE METHYLSULFATE 5 MG/5ML IV SOSY
PREFILLED_SYRINGE | INTRAVENOUS | Status: AC
  Filled 2017-08-14: qty 5

## 2017-08-14 MED ORDER — FENTANYL CITRATE (PF) 250 MCG/5ML IJ SOLN
INTRAMUSCULAR | Status: DC | PRN
  Administered 2017-08-14 (×4): 50 ug via INTRAVENOUS
  Administered 2017-08-14: 100 ug via INTRAVENOUS

## 2017-08-14 MED ORDER — SODIUM CHLORIDE 0.9 % IV SOLN
2.0000 g | INTRAVENOUS | Status: DC
  Administered 2017-08-14 – 2017-08-15 (×2): 2 g via INTRAVENOUS
  Filled 2017-08-14 (×2): qty 20

## 2017-08-14 MED ORDER — MORPHINE SULFATE (PF) 4 MG/ML IV SOLN
4.0000 mg | Freq: Once | INTRAVENOUS | Status: AC
  Administered 2017-08-14: 4 mg via INTRAVENOUS
  Filled 2017-08-14: qty 1

## 2017-08-14 SURGICAL SUPPLY — 49 items
ADH SKN CLS APL DERMABOND .7 (GAUZE/BANDAGES/DRESSINGS) ×1
APPLIER CLIP ROT 10 11.4 M/L (STAPLE)
APR CLP MED LRG 11.4X10 (STAPLE)
BAG SPEC RTRVL LRG 6X4 10 (ENDOMECHANICALS) ×1
BLADE CLIPPER SURG (BLADE) IMPLANT
CANISTER SUCT 3000ML PPV (MISCELLANEOUS) ×2 IMPLANT
CHLORAPREP W/TINT 26ML (MISCELLANEOUS) ×2 IMPLANT
CLIP APPLIE ROT 10 11.4 M/L (STAPLE) IMPLANT
COVER SURGICAL LIGHT HANDLE (MISCELLANEOUS) ×2 IMPLANT
CUTTER FLEX LINEAR 45M (STAPLE) ×2 IMPLANT
DERMABOND ADVANCED (GAUZE/BANDAGES/DRESSINGS) ×1
DERMABOND ADVANCED .7 DNX12 (GAUZE/BANDAGES/DRESSINGS) ×1 IMPLANT
DRAPE WARM FLUID 44X44 (DRAPE) ×1 IMPLANT
ELECT REM PT RETURN 9FT ADLT (ELECTROSURGICAL) ×2
ELECTRODE REM PT RTRN 9FT ADLT (ELECTROSURGICAL) ×1 IMPLANT
ENDOLOOP SUT PDS II  0 18 (SUTURE)
ENDOLOOP SUT PDS II 0 18 (SUTURE) IMPLANT
GLOVE BIO SURGEON STRL SZ 6 (GLOVE) ×1 IMPLANT
GLOVE BIO SURGEON STRL SZ7 (GLOVE) ×1 IMPLANT
GLOVE BIO SURGEON STRL SZ8 (GLOVE) ×2 IMPLANT
GLOVE BIOGEL PI IND STRL 6.5 (GLOVE) IMPLANT
GLOVE BIOGEL PI IND STRL 8 (GLOVE) ×1 IMPLANT
GLOVE BIOGEL PI INDICATOR 6.5 (GLOVE) ×1
GLOVE BIOGEL PI INDICATOR 8 (GLOVE) ×1
GLOVE ECLIPSE 6.0 STRL STRAW (GLOVE) ×1 IMPLANT
GOWN STRL REUS W/ TWL LRG LVL3 (GOWN DISPOSABLE) ×2 IMPLANT
GOWN STRL REUS W/ TWL XL LVL3 (GOWN DISPOSABLE) ×1 IMPLANT
GOWN STRL REUS W/TWL LRG LVL3 (GOWN DISPOSABLE) ×4
GOWN STRL REUS W/TWL XL LVL3 (GOWN DISPOSABLE) ×2
KIT BASIN OR (CUSTOM PROCEDURE TRAY) ×2 IMPLANT
KIT TURNOVER KIT B (KITS) ×2 IMPLANT
NS IRRIG 1000ML POUR BTL (IV SOLUTION) ×2 IMPLANT
PAD ARMBOARD 7.5X6 YLW CONV (MISCELLANEOUS) ×4 IMPLANT
POUCH SPECIMEN RETRIEVAL 10MM (ENDOMECHANICALS) ×2 IMPLANT
RELOAD STAPLE 45 3.5 BLU ETS (ENDOMECHANICALS) ×1 IMPLANT
RELOAD STAPLE TA45 3.5 REG BLU (ENDOMECHANICALS) ×2 IMPLANT
SCISSORS LAP 5X35 DISP (ENDOMECHANICALS) ×2 IMPLANT
SET IRRIG TUBING LAPAROSCOPIC (IRRIGATION / IRRIGATOR) ×2 IMPLANT
SHEARS HARMONIC ACE PLUS 36CM (ENDOMECHANICALS) ×2 IMPLANT
SPECIMEN JAR SMALL (MISCELLANEOUS) ×2 IMPLANT
SUT MON AB 4-0 PC3 18 (SUTURE) ×2 IMPLANT
TOWEL OR 17X24 6PK STRL BLUE (TOWEL DISPOSABLE) ×2 IMPLANT
TOWEL OR 17X26 10 PK STRL BLUE (TOWEL DISPOSABLE) ×1 IMPLANT
TRAY FOLEY CATH SILVER 16FR (SET/KITS/TRAYS/PACK) ×2 IMPLANT
TRAY LAPAROSCOPIC MC (CUSTOM PROCEDURE TRAY) ×2 IMPLANT
TROCAR XCEL BLADELESS 5X75MML (TROCAR) ×4 IMPLANT
TROCAR XCEL BLUNT TIP 100MML (ENDOMECHANICALS) ×2 IMPLANT
TUBING INSUFFLATION (TUBING) ×2 IMPLANT
WATER STERILE IRR 1000ML POUR (IV SOLUTION) ×2 IMPLANT

## 2017-08-14 NOTE — Progress Notes (Signed)
Requested to doppler [redacted] week gestation pregnancy/established OB patient of [facility]/prior to appendectomy surgical procedure.  +FHT's.

## 2017-08-14 NOTE — Progress Notes (Signed)
[facility] at bedside- fetal tones assessed & documentation completed by RN.

## 2017-08-14 NOTE — Interval H&P Note (Signed)
History and Physical Interval Note:  08/14/2017 12:20 PM  Michelle Page  has presented today for surgery, with the diagnosis of appendicitis  The various methods of treatment have been discussed with the patient and family. After consideration of risks, benefits and other options for treatment, the patient has consented to  Procedure(s): APPENDECTOMY LAPAROSCOPIC (N/A) as a surgical intervention .  The patient's history has been reviewed, patient examined, no change in status, stable for surgery.  I have reviewed the patient's chart and labs.  Questions were answered to the patient's satisfaction.   Pt seen examined and MRI reviewed Risk of fetal loss discussed with the patient and family as well as other potential complications of operative and non operative management.  Risk of fetal loss higher if  Non operative means fail.  The procedure has been discussed with the patient.  Alternative therapies have been discussed with the patient.  Operative risks include bleeding,  Infection,  Organ injury,  Uterine injury, amniotic fluid embolism,  Fetal loss ,  Nerve injury,  Blood vessel injury,  DVT,  Pulmonary embolism,  Death,  And possible reoperation.  Medical management risks include worsening of present situation.  The success of the procedure is 50 -90 % at treating patients symptoms.  The patient understands and agrees to proceed. [place]

## 2017-08-14 NOTE — ED Notes (Signed)
Patient transported to MRI 

## 2017-08-14 NOTE — Anesthesia Procedure Notes (Signed)
Procedure Name: Intubation Date/Time: 08/14/2017 12:55 PM Performed by: Renato Shin, CRNA Pre-anesthesia Checklist: Patient identified, Emergency Drugs available, Suction available and Patient being monitored Patient Re-evaluated:Patient Re-evaluated prior to induction Oxygen Delivery Method: Circle system utilized Preoxygenation: Pre-oxygenation with 100% oxygen Induction Type: IV induction and Rapid sequence Laryngoscope Size: Miller and 2 Grade View: Grade I Tube type: Oral Tube size: 7.0 mm Number of attempts: 1 Airway Equipment and Method: Stylet Placement Confirmation: ETT inserted through vocal cords under direct vision,  positive ETCO2,  CO2 detector and breath sounds checked- equal and bilateral Secured at: 21 cm Tube secured with: Tape Dental Injury: Teeth and Oropharynx as per pre-operative assessment

## 2017-08-14 NOTE — ED Notes (Signed)
oob to bathroom

## 2017-08-14 NOTE — Progress Notes (Signed)
Dr Si Raider updated on pt status.  Make appt with [facility] next week instead of waiting for 4/28.

## 2017-08-14 NOTE — ED Notes (Signed)
GS made aware of adendum to MRI.

## 2017-08-14 NOTE — Op Note (Signed)
Appendectomy, Lap, Procedure Note  Indications: The patient presented with a history of right-sided abdominal pain. A MRI   revealed findings consistent with acute appendicitis. She is [redacted] weeks pregnant and MR showed findings of appendicitis. The procedure has been discussed with the patient.  Alternative therapies have been discussed with the patient.  Operative risks include bleeding,  Infection,  Organ injury,  Abscess,  Nerve injury,  Fetal loss,  Blood vessel injury,  DVT,  Pulmonary embolism,  Death,  And possible reoperation.  Medical management risks include worsening of present situation.  The success of the procedure is 50 -90 % at treating patients symptoms.  The patient understands and agrees to proceed.  Pre-operative Diagnosis: Acute appendicitis without mention of peritonitis  Post-operative Diagnosis: Same  Surgeon: Joyice Faster Braylynn Ghan   Assistants: [place] PA-C   Anesthesia: General endotracheal anesthesia and Local anesthesia 0.25.% bupivacaine, with epinephrine  ASA Class: 2  Procedure Details  The patient was seen again in the Holding Room. The risks, benefits, complications, treatment options, and expected outcomes were discussed with the patient and/or family. The possibilities of reaction to medication, pulmonary aspiration, perforation of viscus, bleeding, recurrent infection, finding a normal appendix, the need for additional procedures, failure to diagnose a condition, and creating a complication requiring transfusion or operation were discussed. There was concurrence with the proposed plan and informed consent was obtained. The site of surgery was properly noted/marked. The patient was taken to Operating Room, identified as Michelle Page and the procedure verified as Appendectomy. A Time Out was held and the above information confirmed.  The patient was placed in the supine position and general anesthesia was induced, along with placement of orogastric tube, Venodyne  boots, and a Foley catheter. The abdomen was prepped and draped in a sterile fashion. A one centimeter infraumbilical incision was made and the peritoneal cavity was accessed using the OPEN  technique.  The uterus was visualized and was not injured during the procedure.  The pneumoperitoneum was then established to steady pressure of 12 mmHg. A 12 mm port was placed through the umbilical incision. Additional 5 mm cannulas then placed in the upper midline  of the abdomen and right upper quadrant  under direct vision. A careful evaluation of the entire abdomen was carried out. The patient was placed in Trendelenburg and left lateral decubitus position. The small intestines were retracted in the cephalad and left lateral direction away from the pelvis and right lower quadrant. The patient was found to have an enlarged and inflamed appendix that was extending into the pelvis. There was no evidence of perforation. The uterus was at an appropriate size given gestation.   The appendix was carefully dissected. A window was made in the mesoappendix at the base of the appendix. A harmonic scalpel was used across the mesoappendix. The appendix was divided at its base using an endo-GIA stapler. Minimal appendiceal stump was left in place. There was no evidence of bleeding, leakage, or complication after division of the appendix. Irrigation was also performed and irrigate suctioned from the abdomen as well.  The umbilical port site was closed using 0 vicryl pursestring sutures fashion at the level of the fascia. The trocar site skin wounds with 4 0 monocryl.  Instrument, sponge, and needle counts were correct at the conclusion of the case.   Findings: The appendix was found to be inflamed. There were not signs of necrosis.  There was not perforation. There was not abscess formation.  Estimated Blood Loss:  less than 50 mL         Drains: NONE         Total IV Fluids: PER ANESTHESIA RECORD         Specimens:  appendix          Complications:  None; patient tolerated the procedure well.         Disposition: PACU - hemodynamically stable.         Condition: stable

## 2017-08-14 NOTE — ED Notes (Signed)
GS paged to [place] by Levada Dy

## 2017-08-14 NOTE — Progress Notes (Signed)
[facility] called to assess fetal heart tones in PACU. Made aware of patients arrival in PACU and will be here when able.

## 2017-08-14 NOTE — Anesthesia Preprocedure Evaluation (Addendum)
Anesthesia Evaluation  Patient identified by MRN, date of birth, ID band Patient awake    Reviewed: Allergy & Precautions, NPO status , Patient's Chart, lab work & pertinent test results  Airway Mallampati: II  TM Distance: >3 FB Neck ROM: Full    Dental  (+) Teeth Intact, Dental Advisory Given   Pulmonary former smoker,    breath sounds clear to auscultation       Cardiovascular hypertension,  Rhythm:Regular Rate:Normal     Neuro/Psych  Headaches, PSYCHIATRIC DISORDERS Anxiety Depression    GI/Hepatic negative GI ROS, Neg liver ROS,   Endo/Other  negative endocrine ROS  Renal/GU negative Renal ROS     Musculoskeletal negative musculoskeletal ROS (+)   Abdominal (+) + obese,   Peds  Hematology  (+) anemia ,   Anesthesia Other Findings   Reproductive/Obstetrics                            Anesthesia Physical Anesthesia Plan  ASA: III  Anesthesia Plan: General   Post-op Pain Management:    Induction: Intravenous, Rapid sequence and Cricoid pressure planned  PONV Risk Score and Plan: 4 or greater and Ondansetron and Dexamethasone  Airway Management Planned: Oral ETT  Additional Equipment: None  Intra-op Plan:   Post-operative Plan: Extubation in OR  Informed Consent: I have reviewed the patients History and Physical, chart, labs and discussed the procedure including the risks, benefits and alternatives for the proposed anesthesia with the patient or authorized representative who has indicated his/her understanding and acceptance.   Dental advisory given  Plan Discussed with: CRNA  Anesthesia Plan Comments:        Anesthesia Quick Evaluation

## 2017-08-14 NOTE — H&P (Signed)
Michelle Page is an 42 y.o. female.   Chief Complaint: Abdominal pain HPI: Patient is a 42 year old female, [redacted] weeks pregnant, with a approximately a 12-14-hour history of abdominal pain.  Patient states that the pain initially began in the generalized abdomen and now is concentrated to the right lower quadrant.  She does state that she has had pains on and off in the right lower quadrant over the last several weeks to months.  Patient was transferred in to [facility] ER from outside facility to undergo MRI.  MRI was consistent with likely early acute appendicitis as the midportion of the appendix was dilated however the proximal distal portion were not dilated.  I did review the scan personally.  Patient denies any fevers or diarrhea, endorses nausea, vomiting.  General surgery was consulted for further evaluation and management.  Of note this is the patient's sixth pregnancy.  She has not had any pregnancy issues on this pregnancy up to this point.  Past Medical History:  Diagnosis Date  . Abnormal Pap smear    colpo/leep  . Anemia   . Anxiety   . Cancer [facility]) 2006   Skin  . Chlamydia infection   . Depression   . Fractures   . H/O candidiasis   . H/O varicella   . Headache(784.0)    migraines as a child   . Heart murmur   . High risk HPV infection   . Papanicolaou smear of cervix with positive high risk human papilloma virus (HPV) test 06/03/2017  . Pregnancy induced hypertension    1st & 2nd pregnancy  . Pregnancy induced hypertension    1st & 2nd pregnancy   . Two vessel umbilical cord, antepartum 11/26/2011  . Vaginal Pap smear, abnormal   . Yeast infection     Past Surgical History:  Procedure Laterality Date  . COLPOSCOPY    . LEEP    . WISDOM TOOTH EXTRACTION      Family History  Problem Relation Age of Onset  . Alcohol abuse Father   . Emphysema Father   . COPD Father   . Mental illness Father        PTSD  . Alcohol abuse Brother   . Stroke Brother   .  COPD Paternal Grandmother   . Cancer Paternal Grandmother        skin cancer  . Heart disease Paternal Grandfather        enlarged heart  . Multiple sclerosis Sister   . Depression Mother   . Asthma Son    Social History:  reports that she has quit smoking. Her smoking use included cigarettes. She has never used smokeless tobacco. She reports that she does not drink alcohol or use drugs.  Allergies: No Known Allergies   (Not in a hospital admission)  Results for orders placed or performed during the hospital encounter of 08/13/17 (from the past 48 hour(s))  Urinalysis, Routine w reflex microscopic     Status: Abnormal   Collection Time: 08/13/17  6:59 PM  Result Value Ref Range   Color, Urine STRAW (A) YELLOW   APPearance CLEAR CLEAR   Specific Gravity, Urine 1.011 1.005 - 1.030   pH 7.0 5.0 - 8.0   Glucose, UA NEGATIVE NEGATIVE mg/dL   Hgb urine dipstick NEGATIVE NEGATIVE   Bilirubin Urine NEGATIVE NEGATIVE   Ketones, ur NEGATIVE NEGATIVE mg/dL   Protein, ur NEGATIVE NEGATIVE mg/dL   Nitrite NEGATIVE NEGATIVE   Leukocytes, UA NEGATIVE NEGATIVE  Comment: Performed at [facility], [address], [place], [place] [address]  Lipase, blood     Status: None   Collection Time: 08/13/17  7:25 PM  Result Value Ref Range   Lipase 29 11 - 51 U/L    Comment: Performed at [facility], [address] [place], [place] [address]  Comprehensive metabolic panel     Status: Abnormal   Collection Time: 08/13/17  7:25 PM  Result Value Ref Range   Sodium 136 135 - 145 mmol/L   Potassium 3.7 3.5 - 5.1 mmol/L   Chloride 107 101 - 111 mmol/L   CO2 20 (L) 22 - 32 mmol/L   Glucose, Bld 108 (H) 65 - 99 mg/dL   BUN 9 6 - 20 mg/dL   Creatinine, Ser 0.46 0.44 - 1.00 mg/dL   Calcium 9.0 8.9 - 10.3 mg/dL   Total Protein 6.5 6.5 - 8.1 g/dL   Albumin 3.3 (L) 3.5 - 5.0 g/dL   AST 23 15 - 41 U/L   ALT 24 14 - 54 U/L   Alkaline Phosphatase 48 38 - 126 U/L   Total Bilirubin 0.3 0.3 - 1.2 mg/dL    GFR calc non Af Amer >60 >60 mL/min   GFR calc Af Amer >60 >60 mL/min    Comment: (NOTE) The eGFR has been calculated using the CKD EPI equation. This calculation has not been validated in all clinical situations. eGFR's persistently <60 mL/min signify possible Chronic Kidney Disease.    Anion gap 9 5 - 15    Comment: Performed at [facility], [address] [place], [place] [address]  CBC     Status: Abnormal   Collection Time: 08/13/17  7:25 PM  Result Value Ref Range   WBC 10.6 (H) 4.0 - 10.5 K/uL   RBC 3.92 3.87 - 5.11 MIL/uL   Hemoglobin 11.7 (L) 12.0 - 15.0 g/dL   HCT 34.8 (L) 36.0 - 46.0 %   MCV 88.8 78.0 - 100.0 fL   MCH 29.8 26.0 - 34.0 pg   MCHC 33.6 30.0 - 36.0 g/dL   RDW 13.3 11.5 - 15.5 %   Platelets 178 150 - 400 K/uL    Comment: Performed at [facility], [address] [place], [place] [address]  Pregnancy, urine     Status: Abnormal   Collection Time: 08/13/17  7:30 PM  Result Value Ref Range   Preg Test, Ur POSITIVE (A) NEGATIVE    Comment:        THE SENSITIVITY OF THIS METHODOLOGY IS >20 mIU/mL. Performed at [facility], [address] [place], [place] [address]    No results found.  Review of Systems  Constitutional: Negative for chills, fever and malaise/fatigue.  HENT: Negative for ear discharge, hearing loss and sore throat.   Eyes: Negative for blurred vision and discharge.  Respiratory: Negative for cough and shortness of breath.   Cardiovascular: Negative for chest pain, orthopnea and leg swelling.  Gastrointestinal: Positive for abdominal pain, nausea and vomiting. Negative for constipation, diarrhea and heartburn.  Musculoskeletal: Negative for myalgias and neck pain.  Skin: Negative for itching and rash.  Neurological: Negative for dizziness, focal weakness, seizures and loss of consciousness.  Endo/Heme/Allergies: Negative for environmental allergies. Does not bruise/bleed easily.  Psychiatric/Behavioral: Negative for depression and  suicidal ideas.  All other systems reviewed and are negative.   Blood pressure 114/74, pulse 84, temperature 98.6 F (37 C), temperature source Oral, resp. rate 16, height '5\' 4"'  (1.626 m), weight  95.3 kg (210 lb), last menstrual period 04/23/2017, SpO2 98 %, not currently breastfeeding. Physical Exam  Constitutional: She is oriented to person, place, and time. Vital signs are normal. She appears well-developed and well-nourished.  Conversant No acute distress  Eyes: Lids are normal. No scleral icterus.  No lid lag Moist conjunctiva  Neck: No tracheal tenderness present. No thyromegaly present.  No cervical lymphadenopathy  Cardiovascular: Normal rate, regular rhythm and intact distal pulses.  No murmur heard. Respiratory: Effort normal and breath sounds normal. She has no wheezes. She has no rales.  GI: Soft. Bowel sounds are normal. She exhibits no distension. There is no hepatosplenomegaly. There is tenderness. There is tenderness at McBurney's point. There is no rebound. No hernia.  Neurological: She is alert and oriented to person, place, and time.  Normal gait and station  Skin: Skin is warm. No rash noted. No cyanosis. Nails show no clubbing.  Normal skin turgor  Psychiatric: Judgment normal.  Appropriate affect     Assessment/Plan 42 year old female with likely early acute appendicitis.  Had a long discussion with the patient in regards to the nonoperative versus operative treatment for acute appendicitis.  I did discuss with her the risks to premature labor with both nonoperative and operative treatments, and the non-viability of the pregnancy at this point.  After discussing with her family she has decided to proceed with laparoscopic appendectomy. 1.  We will admit her, keep her n.p.o., proceed to the OR for laparoscopic appendectomy.  I will discussed this patient with Dr. Brantley Stage. She has had a treatment of antibiotics at this time.    Reyes Ivan,  MD 08/14/2017, 4:36 AM

## 2017-08-14 NOTE — Progress Notes (Signed)
FHT's 145.  Pt is s/p appendectomy.  Pt has an appt with OBGyn at [facility] April 28th.

## 2017-08-14 NOTE — Transfer of Care (Signed)
Immediate Anesthesia Transfer of Care Note  Patient: Michelle Page  Procedure(s) Performed: APPENDECTOMY LAPAROSCOPIC (N/A Abdomen)  Patient Location: PACU  Anesthesia Type:General  Level of Consciousness: awake, alert , oriented and patient cooperative  Airway & Oxygen Therapy: Patient Spontanous Breathing and Patient connected to nasal cannula oxygen  Post-op Assessment: Report given to RN and Post -op Vital signs reviewed and stable  Post vital signs: Reviewed and stable  Last Vitals:  Vitals Value Taken Time  BP 107/60 08/14/2017  2:31 PM  Temp    Pulse 102 08/14/2017  2:31 PM  Resp 15 08/14/2017  2:31 PM  SpO2 96 % 08/14/2017  2:31 PM  Vitals shown include unvalidated device data.  Last Pain:  Vitals:   08/14/17 1117  TempSrc:   PainSc: 0-No pain         Complications: No apparent anesthesia complications

## 2017-08-15 ENCOUNTER — Telehealth: Payer: Self-pay | Admitting: Obstetrics & Gynecology

## 2017-08-15 ENCOUNTER — Encounter (HOSPITAL_COMMUNITY): Payer: Self-pay | Admitting: Surgery

## 2017-08-15 MED ORDER — ACETAMINOPHEN 325 MG PO TABS: 1000.0000 mg | ORAL_TABLET | Freq: Four times a day (QID) | ORAL | Status: DC | PRN

## 2017-08-15 MED ORDER — ACETAMINOPHEN 325 MG PO TABS
650.0000 mg | ORAL_TABLET | Freq: Four times a day (QID) | ORAL | Status: DC | PRN
  Administered 2017-08-15 (×2): 650 mg via ORAL
  Filled 2017-08-15 (×2): qty 2

## 2017-08-15 NOTE — Discharge Instructions (Signed)

## 2017-08-15 NOTE — Telephone Encounter (Signed)
Patient states she needs to be seen sooner that 4/30 due to having surgery yesterday.  Appt made for 4/24.  Will need to reschedule 4/30 appt due to her having a post op appt that day.

## 2017-08-15 NOTE — Telephone Encounter (Signed)
Patient called stating that she was told by her surgeon to give [place] a call to check out her baby. Please contact pt

## 2017-08-15 NOTE — Anesthesia Postprocedure Evaluation (Addendum)
Anesthesia Post Note  Patient: Michelle Page  Procedure(s) Performed: APPENDECTOMY LAPAROSCOPIC (N/A Abdomen)     Patient location during evaluation: PACU Anesthesia Type: General Level of consciousness: awake and alert Pain management: pain level controlled Vital Signs Assessment: post-procedure vital signs reviewed and stable Respiratory status: spontaneous breathing, nonlabored ventilation, respiratory function stable and patient connected to nasal cannula oxygen Cardiovascular status: blood pressure returned to baseline and stable Postop Assessment: no apparent nausea or vomiting Anesthetic complications: no Comments: Doppler Fetal HR present.     Last Vitals:  Vitals:   08/15/17 1050 08/15/17 1356  BP: 101/61 108/65  Pulse: 85 80  Resp:    Temp: 36.8 C 36.9 C  SpO2: 99% 100%                 Effie Berkshire

## 2017-08-15 NOTE — Progress Notes (Signed)
Pt was ambulating to the hall independently. Surgical pain is controlled. Tolerated regular diet. Discharge instructions given to pt, verbalized understanding. Discharged to home accompanied by spouse.

## 2017-08-15 NOTE — Discharge Summary (Signed)
[facility] Discharge Summary   Patient ID: Michelle Page MRN: [id number] DOB/AGE: [birth year] 40 y.o.  Admit date: 08/13/2017 Discharge date: 08/15/2017   Discharge Diagnosis Patient Active Problem List   Diagnosis Date Noted  . Acute appendicitis 08/14/2017  . S/P appendectomy 08/14/2017  . Supervision of high risk pregnancy, antepartum 07/02/2017  . AMA (advanced maternal age) multigravida 35+ 07/02/2017  . History of gestational hypertension 07/02/2017  . Papanicolaou smear of cervix with positive high risk human papilloma virus (HPV) test 06/03/2017  . Chronic urticaria 01/29/2017  . Melanoma of face ([place]) 01/29/2017  . Abnormal Pap smear of cervix 01/29/2017  . Heart murmur   . High risk HPV infection   . Depression 09/12/2011  . Anemia 09/12/2011    Consultants None   Imaging: Mr Pelvis Wo Contrast  Result Date: 08/14/2017 CLINICAL DATA:  [redacted] weeks pregnant, right lower quadrant abdominal pain EXAM: MRI ABDOMEN AND PELVIS WITHOUT CONTRAST TECHNIQUE: Multiplanar multisequence MR imaging of the abdomen and pelvis was performed. No intravenous contrast was administered. COMPARISON:  None. FINDINGS: Study prematurely aborted after 5 series.  This remains diagnostic. Lower chest: Visualized lung bases are clear. Hepatobiliary: Liver is within normal limits. Gallbladder is unremarkable.  No cholelithiasis. No intrahepatic or extrahepatic ductal dilatation. Pancreas:  Visualized pancreas is unremarkable. Spleen:  Spleen is within normal limits. Adrenals/Urinary Tract:  Adrenal glands within normal limits. Suspected 16 mm left upper pole renal cyst (series 6/image 8). Kidneys are otherwise within normal limits. No hydronephrosis. Bladder is unremarkable. Stomach/Bowel: Stomach is within normal limits. No evidence of bowel obstruction. Mid/distal appendix is mildly dilated with wall thickening, measuring up to 13 mm (series 6/image 20). Proximally, the appendix is normal  (series 6/image 22). In the appropriate clinical setting, this appearance is worrisome for early acute appendicitis. No drainable fluid collection/abscess or free air. Vascular/Lymphatic:  No evidence of abdominal aortic aneurysm. No suspicious abdominopelvic lymphadenopathy. Reproductive: Gravid uterus. Dedicated fetal evaluation was not performed. Cephalic presentation. Anterior placenta with marginal placenta previa (series 7/image 20). Other:  No abdominopelvic ascites. Musculoskeletal: No focal osseous lesions. IMPRESSION: Findings worrisome for early acute appendicitis. These results were called by telephone at the time of preliminary interpretation by Dr Radene Knee on 08/14/2017 at 2:29 am to [facility], who verbally acknowledged these results. No evidence of perforation. Marginal placenta previa. Follow-up ultrasound is suggested at 20 weeks. These results will be called to the ordering clinician or representative by the Radiologist Assistant, and communication documented in the PACS or zVision Dashboard. Electronically Signed   By: Julian Hy M.D.   On: 08/14/2017 08:06   Mr Abdomen Wo Contrast  Result Date: 08/14/2017 CLINICAL DATA:  [redacted] weeks pregnant, right lower quadrant abdominal pain EXAM: MRI ABDOMEN AND PELVIS WITHOUT CONTRAST TECHNIQUE: Multiplanar multisequence MR imaging of the abdomen and pelvis was performed. No intravenous contrast was administered. COMPARISON:  None. FINDINGS: Study prematurely aborted after 5 series.  This remains diagnostic. Lower chest: Visualized lung bases are clear. Hepatobiliary: Liver is within normal limits. Gallbladder is unremarkable.  No cholelithiasis. No intrahepatic or extrahepatic ductal dilatation. Pancreas:  Visualized pancreas is unremarkable. Spleen:  Spleen is within normal limits. Adrenals/Urinary Tract:  Adrenal glands within normal limits. Suspected 16 mm left upper pole renal cyst (series 6/image 8). Kidneys are otherwise within normal limits.  No hydronephrosis. Bladder is unremarkable. Stomach/Bowel: Stomach is within normal limits. No evidence of bowel obstruction. Mid/distal appendix is mildly dilated with wall thickening, measuring up to 13 mm (series  6/image 20). Proximally, the appendix is normal (series 6/image 22). In the appropriate clinical setting, this appearance is worrisome for early acute appendicitis. No drainable fluid collection/abscess or free air. Vascular/Lymphatic:  No evidence of abdominal aortic aneurysm. No suspicious abdominopelvic lymphadenopathy. Reproductive: Gravid uterus. Dedicated fetal evaluation was not performed. Cephalic presentation. Anterior placenta with marginal placenta previa (series 7/image 20). Other:  No abdominopelvic ascites. Musculoskeletal: No focal osseous lesions. IMPRESSION: Findings worrisome for early acute appendicitis. These results were called by telephone at the time of preliminary interpretation by Dr Radene Knee on 08/14/2017 at 2:29 am to [facility], who verbally acknowledged these results. No evidence of perforation. Marginal placenta previa. Follow-up ultrasound is suggested at 20 weeks. These results will be called to the ordering clinician or representative by the Radiologist Assistant, and communication documented in the PACS or zVision Dashboard. Electronically Signed   By: Julian Hy M.D.   On: 08/14/2017 08:06    Procedures Dr. Marcello Moores Cornett (08/14/17)- Laparoscopic Appendectomy  Hospital Course:  42 y.o. Female who is [redacted] weeks pregnant who presented to [facility] with 12-14 hours abdominal pain.  Workup, including MRI above, showed early acute appendicitis.  Patient was admitted and underwent procedure listed above.  Tolerated procedure well and was transferred to the floor.  Diet was advanced as tolerated.  On POD#1, the patient was voiding well, tolerating diet, ambulating well, pain well controlled, vital signs stable, incisions c/d/i and felt stable for discharge home.  Patient  will follow up in our office in 2 weeks and knows to call with questions or concerns.   MRI also showed placenta previa. The patients Midwife was notified of her hospitalization and MRI results.   Physical Exam: General:  Alert, NAD, pleasant, comfortable Abd:  Soft, ND, appropriately tender, incisions c/d/i   Allergies as of 08/15/2017   No Known Allergies     Medication List    STOP taking these medications   amoxicillin 500 MG capsule Commonly known as:  AMOXIL     TAKE these medications   acetaminophen 325 MG tablet Commonly known as:  TYLENOL Take 3 tablets (975 mg total) by mouth every 6 (six) hours as needed for mild pain.   aspirin EC 81 MG tablet Take 81 mg by mouth daily.   diphenhydrAMINE 25 MG tablet Commonly known as:  BENADRYL Take 25 mg by mouth every 6 (six) hours as needed for allergies.   prenatal vitamin w/FE, FA 27-1 MG Tabs tablet Take 1 tablet by mouth daily at 12 noon.        Follow-up [facility], [place]. Go on 08/27/2017.   Specialty:  General Surgery Why:  at 11:45 AM for post-operative follow up. please arrive by 11:15 to get checked in and fill out any necessary paperwork. Contact information: [address] [place] [phone]          Signed: Obie Dredge, [facility] 08/15/2017, 10:21 AM Pager: [phone] Consults: [phone] Mon-Fri 7:00 am-4:30 pm Sat-Sun 7:00 am-11:30 am

## 2017-08-19 ENCOUNTER — Telehealth: Payer: Self-pay

## 2017-08-19 NOTE — Telephone Encounter (Signed)
Check chart.  It looks like I am not the PCP. Do not need to do TOC if patient has another PCP.

## 2017-08-19 NOTE — Telephone Encounter (Signed)
Ok. Thank you.

## 2017-08-19 NOTE — Telephone Encounter (Signed)
Transition Care Management Follow-up Telephone Call   Date discharged? 08/16/17                How have you been since you were released from the hospital? sore and tired   Do you understand why you were in the hospital? appendectomy   Do you understand the discharge instructions? yes   Where were you discharged to? home   Items Reviewed:  Medications reviewed: yes  Allergies reviewed: yes  Dietary changes reviewed: regular diet   Referrals reviewed: follow up with [facility]   Functional Questionnaire:   Activities of Daily Living (ADLs): yes but trying to take it easy, but she has help if she needs it   Any transportation issues/concerns?: no   Any patient concerns? no   Confirmed importance and date/time of follow-up visits scheduled Patient refused to schedule TOC follow up appt stating she had to see her OB/GYN twice, follow up w/ her surgeon, and see a dentist. She has 5 children, and she just has a lot on her plate and wasn't comfortable making the appt right now. Patient was offered 40 minute appt time for 4/23 at 8:20 am and told the importance of having the f/u appt. Confirmed with patient if condition begins to worsen call PCP or go to the ER.  Patient was given the office number and encouraged to call back with question or concerns. yes, with verbal understanding

## 2017-08-19 NOTE — Telephone Encounter (Signed)
Attempted to contact patient to complete TOC call. Will try back later

## 2017-08-21 ENCOUNTER — Encounter: Payer: Self-pay | Admitting: Obstetrics and Gynecology

## 2017-08-21 ENCOUNTER — Ambulatory Visit (INDEPENDENT_AMBULATORY_CARE_PROVIDER_SITE_OTHER): Payer: Medicaid Other | Admitting: Obstetrics and Gynecology

## 2017-08-21 DIAGNOSIS — Z3A17 17 weeks gestation of pregnancy: Secondary | ICD-10-CM

## 2017-08-21 DIAGNOSIS — O09522 Supervision of elderly multigravida, second trimester: Secondary | ICD-10-CM

## 2017-08-21 DIAGNOSIS — O09292 Supervision of pregnancy with other poor reproductive or obstetric history, second trimester: Secondary | ICD-10-CM

## 2017-08-21 DIAGNOSIS — Z1389 Encounter for screening for other disorder: Secondary | ICD-10-CM

## 2017-08-21 DIAGNOSIS — O0942 Supervision of pregnancy with grand multiparity, second trimester: Secondary | ICD-10-CM

## 2017-08-21 DIAGNOSIS — Z331 Pregnant state, incidental: Secondary | ICD-10-CM

## 2017-08-21 LAB — POCT URINALYSIS DIPSTICK
Blood, UA: NEGATIVE
Glucose, UA: NEGATIVE
Ketones, UA: NEGATIVE
Leukocytes, UA: NEGATIVE
Nitrite, UA: NEGATIVE
Protein, UA: NEGATIVE

## 2017-08-21 NOTE — Progress Notes (Addendum)
HIGH-RISK PREGNANCY VISIT Patient name: Michelle Page MRN [id number]  Date of birth: 05/16/1975 Chief Complaint:   Follow-up (after appendectomy; stomach sore and having nausea; constipated)  History of Present Illness: Follow-up visit status post appendectomy  Michelle Page is a 42 y.o. [id number] female at [redacted]w[redacted]d with an Estimated Date of Delivery: 01/28/18 being seen today for ongoing management of a high-risk pregnancy complicated by AMA .  Today she reports no complaints.  . Vag. Bleeding: None.   . denies leaking of fluid.  Review of Systems:   Pertinent items are noted in HPI Denies abnormal vaginal discharge w/ itching/odor/irritation, headaches, visual changes, shortness of breath, chest pain, abdominal pain, severe nausea/vomiting, or problems with urination or bowel movements unless otherwise stated above. Pertinent History Reviewed:  Reviewed past medical,surgical, social, obstetrical and family history.  Reviewed problem list, medications and allergies. Physical Assessment:   Vitals:   08/21/17 0841  BP: 120/62  Pulse: 90  Weight: 207 lb 9.6 oz (94.2 kg)  Body mass index is 35.62 kg/m.           Physical Examination:   General appearance: alert, well appearing, and in no distress and oriented to person, place, and time  Mental status: alert, oriented to person, place, and time, normal mood, behavior, speech, dress, motor activity, and thought processes  Skin: warm & dry   Extremities: Edema: None    Cardiovascular: normal heart rate noted  Respiratory: normal respiratory effort, no distress  Abdomen: gravid, soft, non-tender, bandages placed over appendectomy incisions after removal of glue which was holding the skin edges open  Pelvic: Cervical exam deferred         Fetal Status: Fetal Heart Rate (bpm): 147        Fetal Surveillance Testing today: Doppler fetal heart rate 147  Results for orders placed or performed in visit on 08/21/17 (from the past 24  hour(s))  POCT urinalysis dipstick   Collection Time: 08/21/17  8:42 AM  Result Value Ref Range   Color, UA     Clarity, UA     Glucose, UA neg    Bilirubin, UA     Ketones, UA neg    Spec Grav, UA  1.010 - 1.025   Blood, UA neg    pH, UA  5.0 - 8.0   Protein, UA neg    Urobilinogen, UA  0.2 or 1.0 E.U./dL   Nitrite, UA neg    Leukocytes, UA Negative Negative   Appearance     Odor      Assessment & Plan:  1) High-risk pregnancy [id number] at [redacted]w[redacted]d with an Estimated Date of Delivery: 01/28/18   2) AMA, stable U/S @ 20, 24, 28, 32, 36wks    2x/wk testing nst/sono @ 36wks    Deliver @ 40wks   3) H/o GHTN, continuing baby ASA  4.  Status post appendectomy at 16 weeks  Meds: No orders of the defined types were placed in this encounter.   Labs/procedures today: doppler  Treatment Plan:  U/S @ 20, 24, 28, 32, 36wks    2x/wk testing nst/sono @ 36wks    Deliver @ 40wks Stool softener daily, topical Neosporin to wound care  Reviewed: Preterm labor symptoms and general obstetric precautions including but not limited to vaginal bleeding, contractions, leaking of fluid and fetal movement were reviewed in detail with the patient.  All questions were answered.  Follow-up: Return if symptoms worsen or fail to improve, for HROB, As Scheduled.  Orders Placed This Encounter  Procedures  . POCT urinalysis dipstick     By signing my name below, I, Izna Ahmed, attest that this documentation has been prepared under the direction and in the presence of Jonnie Kind, MD. Electronically Signed: Jabier Gauss, Medical Scribe. 08/21/17. 9:11 AM.

## 2017-08-26 ENCOUNTER — Other Ambulatory Visit: Payer: Self-pay | Admitting: Obstetrics and Gynecology

## 2017-08-26 DIAGNOSIS — Z363 Encounter for antenatal screening for malformations: Secondary | ICD-10-CM

## 2017-08-26 DIAGNOSIS — O09522 Supervision of elderly multigravida, second trimester: Secondary | ICD-10-CM

## 2017-08-27 ENCOUNTER — Ambulatory Visit (INDEPENDENT_AMBULATORY_CARE_PROVIDER_SITE_OTHER): Payer: Medicaid Other

## 2017-08-27 ENCOUNTER — Encounter: Payer: Self-pay | Admitting: Obstetrics & Gynecology

## 2017-08-27 ENCOUNTER — Ambulatory Visit (INDEPENDENT_AMBULATORY_CARE_PROVIDER_SITE_OTHER): Payer: Medicaid Other | Admitting: Obstetrics & Gynecology

## 2017-08-27 VITALS — BP 120/70 | HR 81 | Wt 207.5 lb

## 2017-08-27 DIAGNOSIS — O09522 Supervision of elderly multigravida, second trimester: Secondary | ICD-10-CM

## 2017-08-27 DIAGNOSIS — Z331 Pregnant state, incidental: Secondary | ICD-10-CM

## 2017-08-27 DIAGNOSIS — Z363 Encounter for antenatal screening for malformations: Secondary | ICD-10-CM

## 2017-08-27 DIAGNOSIS — Z1389 Encounter for screening for other disorder: Secondary | ICD-10-CM

## 2017-08-27 DIAGNOSIS — O0990 Supervision of high risk pregnancy, unspecified, unspecified trimester: Secondary | ICD-10-CM

## 2017-08-27 DIAGNOSIS — Z3A18 18 weeks gestation of pregnancy: Secondary | ICD-10-CM

## 2017-08-27 DIAGNOSIS — O0991 Supervision of high risk pregnancy, unspecified, first trimester: Secondary | ICD-10-CM

## 2017-08-27 LAB — POCT URINALYSIS DIPSTICK
Blood, UA: NEGATIVE
Glucose, UA: NEGATIVE
Ketones, UA: NEGATIVE
Leukocytes, UA: NEGATIVE
Nitrite, UA: NEGATIVE
Protein, UA: NEGATIVE

## 2017-08-27 NOTE — Progress Notes (Signed)
[id number] [redacted]w[redacted]d Estimated Date of Delivery: 01/28/18  Blood pressure 120/70, pulse 81, weight 207 lb 8 oz (94.1 kg), last menstrual period 04/23/2017, not currently breastfeeding.   BP weight and urine results all reviewed and noted.  Please refer to the obstetrical flow sheet for the fundal height and fetal heart rate documentation:  Patient reports good fetal movement, denies any bleeding and no rupture of membranes symptoms or regular contractions. Patient is without complaints. All questions were answered.  Orders Placed This Encounter  Procedures  . POCT urinalysis dipstick    Plan:  Continued routine obstetrical care,  Normal informaseq, normal sonogram, will forego the AFP, pt agrees  Return in about 1 month (around 09/24/2017) for LROB.

## 2017-08-27 NOTE — Progress Notes (Signed)
[place] 18 wks,cephalic,cx 5 cm,normal ovaries bilat,anterior pl gr 0,svp 5 cm,fhr 140 bpm,EFW 264 g 91%,anatomy complete,no obvious abnormalities

## 2017-09-24 ENCOUNTER — Encounter: Payer: Medicaid Other | Admitting: Women's Health

## 2017-10-02 ENCOUNTER — Encounter: Payer: Medicaid Other | Admitting: Obstetrics and Gynecology

## 2017-10-09 ENCOUNTER — Ambulatory Visit (INDEPENDENT_AMBULATORY_CARE_PROVIDER_SITE_OTHER): Payer: Medicaid Other | Admitting: Women's Health

## 2017-10-09 ENCOUNTER — Encounter: Payer: Self-pay | Admitting: Women's Health

## 2017-10-09 VITALS — BP 97/64 | HR 78 | Wt 218.0 lb

## 2017-10-09 DIAGNOSIS — O09292 Supervision of pregnancy with other poor reproductive or obstetric history, second trimester: Secondary | ICD-10-CM

## 2017-10-09 DIAGNOSIS — Z1389 Encounter for screening for other disorder: Secondary | ICD-10-CM

## 2017-10-09 DIAGNOSIS — Z331 Pregnant state, incidental: Secondary | ICD-10-CM

## 2017-10-09 DIAGNOSIS — O0992 Supervision of high risk pregnancy, unspecified, second trimester: Secondary | ICD-10-CM

## 2017-10-09 DIAGNOSIS — Z3A24 24 weeks gestation of pregnancy: Secondary | ICD-10-CM

## 2017-10-09 DIAGNOSIS — O09522 Supervision of elderly multigravida, second trimester: Secondary | ICD-10-CM

## 2017-10-09 LAB — POCT URINALYSIS DIPSTICK
Blood, UA: NEGATIVE
Glucose, UA: NEGATIVE
Ketones, UA: NEGATIVE
Leukocytes, UA: NEGATIVE
Nitrite, UA: NEGATIVE
Protein, UA: NEGATIVE

## 2017-10-09 NOTE — Patient Instructions (Addendum)
Michelle Page, I greatly value your feedback.  If you receive a survey following your visit with [place] today, we appreciate you taking the time to fill it out.  Thanks, [name], CNM, WHNP-BC   You will have your sugar test next visit.  Please do not eat or drink anything after midnight the night before you come, not even water.  You will be here for at least two hours.     Call the office [phone]) or go to [facility] if:  You begin to have strong, frequent contractions  Your water breaks.  Sometimes it is a big gush of fluid, sometimes it is just a trickle that keeps getting your panties wet or running down your legs  You have vaginal bleeding.  It is normal to have a small amount of spotting if your cervix was checked.   You don't feel your baby moving like normal.  If you don't, get you something to eat and drink and lay down and focus on feeling your baby move.   If your baby is still not moving like normal, you should call the office or go to [place] [facility] Pregnancy The second trimester is from week 13 through week 28, months 4 through 6. The second trimester is often a time when you feel your best. Your body has also adjusted to being pregnant, and you begin to feel better physically. Usually, morning sickness has lessened or quit completely, you may have more energy, and you may have an increase in appetite. The second trimester is also a time when the fetus is growing rapidly. At the end of the sixth month, the fetus is about 9 inches long and weighs about 1 pounds. You will likely begin to feel the baby move (quickening) between 18 and 20 weeks of the pregnancy. BODY CHANGES Your body goes through many changes during pregnancy. The changes vary from woman to woman.   Your weight will continue to increase. You will notice your lower abdomen bulging out.  You may begin to get stretch marks on your hips, abdomen, and breasts.  You may develop  headaches that can be relieved by medicines approved by your health care provider.  You may urinate more often because the fetus is pressing on your bladder.  You may develop or continue to have heartburn as a result of your pregnancy.  You may develop constipation because certain hormones are causing the muscles that push waste through your intestines to slow down.  You may develop hemorrhoids or swollen, bulging veins (varicose veins).  You may have back pain because of the weight gain and pregnancy hormones relaxing your joints between the bones in your pelvis and as a result of a shift in weight and the muscles that support your balance.  Your breasts will continue to grow and be tender.  Your gums may bleed and may be sensitive to brushing and flossing.  Dark spots or blotches (chloasma, mask of pregnancy) may develop on your face. This will likely fade after the baby is born.  A dark line from your belly button to the pubic area (linea nigra) may appear. This will likely fade after the baby is born.  You may have changes in your hair. These can include thickening of your hair, rapid growth, and changes in texture. Some women also have hair loss during or after pregnancy, or hair that feels dry or thin. Your hair will most likely return to normal after your  baby is born. WHAT TO EXPECT AT YOUR PRENATAL VISITS During a routine prenatal visit:  You will be weighed to make sure you and the fetus are growing normally.  Your blood pressure will be taken.  Your abdomen will be measured to track your baby's growth.  The fetal heartbeat will be listened to.  Any test results from the previous visit will be discussed. Your health care provider may ask you:  How you are feeling.  If you are feeling the baby move.  If you have had any abnormal symptoms, such as leaking fluid, bleeding, severe headaches, or abdominal cramping.  If you have any questions. Other tests that may be  performed during your second trimester include:  Blood tests that check for:  Low iron levels (anemia).  Gestational diabetes (between 24 and 28 weeks).  Rh antibodies.  Urine tests to check for infections, diabetes, or protein in the urine.  An ultrasound to confirm the proper growth and development of the baby.  An amniocentesis to check for possible genetic problems.  Fetal screens for spina bifida and Down syndrome. HOME CARE INSTRUCTIONS   Avoid all smoking, herbs, alcohol, and unprescribed drugs. These chemicals affect the formation and growth of the baby.  Follow your health care provider's instructions regarding medicine use. There are medicines that are either safe or unsafe to take during pregnancy.  Exercise only as directed by your health care provider. Experiencing uterine cramps is a good sign to stop exercising.  Continue to eat regular, healthy meals.  Wear a good support bra for breast tenderness.  Do not use hot tubs, steam rooms, or saunas.  Wear your seat belt at all times when driving.  Avoid raw meat, uncooked cheese, cat litter boxes, and soil used by cats. These carry germs that can cause birth defects in the baby.  Take your prenatal vitamins.  Try taking a stool softener (if your health care provider approves) if you develop constipation. Eat more high-fiber foods, such as fresh vegetables or fruit and whole grains. Drink plenty of fluids to keep your urine clear or pale yellow.  Take warm sitz baths to soothe any pain or discomfort caused by hemorrhoids. Use hemorrhoid cream if your health care provider approves.  If you develop varicose veins, wear support hose. Elevate your feet for 15 minutes, 3-4 times a day. Limit salt in your diet.  Avoid heavy lifting, wear low heel shoes, and practice good posture.  Rest with your legs elevated if you have leg cramps or low back pain.  Visit your dentist if you have not gone yet during your pregnancy.  Use a soft toothbrush to brush your teeth and be gentle when you floss.  A sexual relationship may be continued unless your health care provider directs you otherwise.  Continue to go to all your prenatal visits as directed by your health care provider. SEEK MEDICAL CARE IF:   You have dizziness.  You have mild pelvic cramps, pelvic pressure, or nagging pain in the abdominal area.  You have persistent nausea, vomiting, or diarrhea.  You have a bad smelling vaginal discharge.  You have pain with urination. SEEK IMMEDIATE MEDICAL CARE IF:   You have a fever.  You are leaking fluid from your vagina.  You have spotting or bleeding from your vagina.  You have severe abdominal cramping or pain.  You have rapid weight gain or loss.  You have shortness of breath with chest pain.  You notice sudden or extreme   swelling of your face, hands, ankles, feet, or legs.  You have not felt your baby move in over an hour.  You have severe headaches that do not go away with medicine.  You have vision changes. Document Released: 04/10/2001 Document Revised: 04/21/2013 Document Reviewed: 06/17/2012 [facility] Patient Information 2015 [place], [place]. This information is not intended to replace advice given to you by your health care provider. Make sure you discuss any questions you have with your health care provider.   Sciatica Rehab Ask your health care provider which exercises are safe for you. Do exercises exactly as told by your health care provider and adjust them as directed. It is normal to feel mild stretching, pulling, tightness, or discomfort as you do these exercises, but you should stop right away if you feel sudden pain or your pain gets worse.Do not begin these exercises until told by your health care provider. Stretching and range of motion exercises These exercises warm up your muscles and joints and improve the movement and flexibility of your hips and your back. These exercises  also help to relieve pain, numbness, and tingling. Exercise A: Sciatic nerve glide 1. Sit in a chair with your head facing down toward your chest. Place your hands behind your back. Let your shoulders slump forward. 2. Slowly straighten one of your knees while you tilt your head back as if you are looking toward the ceiling. Only straighten your leg as far as you can without making your symptoms worse. 3. Hold for __________ seconds. 4. Slowly return to the starting position. 5. Repeat with your other leg. Repeat __________ times. Complete this exercise __________ times a day. Exercise B: Knee to chest with hip adduction and internal rotation  1. Lie on your back on a firm surface with both legs straight. 2. Bend one of your knees and move it up toward your chest until you feel a gentle stretch in your lower back and buttock. Then, move your knee toward the shoulder that is on the opposite side from your leg. ? Hold your leg in this position by holding onto the front of your knee. 3. Hold for __________ seconds. 4. Slowly return to the starting position. 5. Repeat with your other leg. Repeat __________ times. Complete this exercise __________ times a day. Exercise C: Prone extension on elbows  1. Lie on your abdomen on a firm surface. A bed may be too soft for this exercise. 2. Prop yourself up on your elbows. 3. Use your arms to help lift your chest up until you feel a gentle stretch in your abdomen and your lower back. ? This will place some of your body weight on your elbows. If this is uncomfortable, try stacking pillows under your chest. ? Your hips should stay down, against the surface that you are lying on. Keep your hip and back muscles relaxed. 4. Hold for __________ seconds. 5. Slowly relax your upper body and return to the starting position. Repeat __________ times. Complete this exercise __________ times a day. Strengthening exercises These exercises build strength and  endurance in your back. Endurance is the ability to use your muscles for a long time, even after they get tired. Exercise D: Pelvic tilt 1. Lie on your back on a firm surface. Bend your knees and keep your feet flat. 2. Tense your abdominal muscles. Tip your pelvis up toward the ceiling and flatten your lower back into the floor. ? To help with this exercise, you may place a small towel  under your lower back and try to push your back into the towel. 3. Hold for __________ seconds. 4. Let your muscles relax completely before you repeat this exercise. Repeat __________ times. Complete this exercise __________ times a day. Exercise E: Alternating arm and leg raises  1. Get on your hands and knees on a firm surface. If you are on a hard floor, you may want to use padding to cushion your knees, such as an exercise mat. 2. Line up your arms and legs. Your hands should be below your shoulders, and your knees should be below your hips. 3. Lift your left leg behind you. At the same time, raise your right arm and straighten it in front of you. ? Do not lift your leg higher than your hip. ? Do not lift your arm higher than your shoulder. ? Keep your abdominal and back muscles tight. ? Keep your hips facing the ground. ? Do not arch your back. ? Keep your balance carefully, and do not hold your breath. 4. Hold for __________ seconds. 5. Slowly return to the starting position and repeat with your right leg and your left arm. Repeat __________ times. Complete this exercise __________ times a day. Posture and body mechanics  Body mechanics refers to the movements and positions of your body while you do your daily activities. Posture is part of body mechanics. Good posture and healthy body mechanics can help to relieve stress in your body's tissues and joints. Good posture means that your spine is in its natural S-curve position (your spine is neutral), your shoulders are pulled back slightly, and your head  is not tipped forward. The following are general guidelines for applying improved posture and body mechanics to your everyday activities. Standing   When standing, keep your spine neutral and your feet about hip-width apart. Keep a slight bend in your knees. Your ears, shoulders, and hips should line up.  When you do a task in which you stand in one place for a long time, place one foot up on a stable object that is 2-4 inches (5-10 cm) high, such as a footstool. This helps keep your spine neutral. Sitting   When sitting, keep your spine neutral and keep your feet flat on the floor. Use a footrest, if necessary, and keep your thighs parallel to the floor. Avoid rounding your shoulders, and avoid tilting your head forward.  When working at a desk or a computer, keep your desk at a height where your hands are slightly lower than your elbows. Slide your chair under your desk so you are close enough to maintain good posture.  When working at a computer, place your monitor at a height where you are looking straight ahead and you do not have to tilt your head forward or downward to look at the screen. Resting   When lying down and resting, avoid positions that are most painful for you.  If you have pain with activities such as sitting, bending, stooping, or squatting (flexion-based activities), lie in a position in which your body does not bend very much. For example, avoid curling up on your side with your arms and knees near your chest (fetal position).  If you have pain with activities such as standing for a long time or reaching with your arms (extension-based activities), lie with your spine in a neutral position and bend your knees slightly. Try the following positions: ? Lying on your side with a pillow between your knees. ? Lying on  your back with a pillow under your knees. Lifting   When lifting objects, keep your feet at least shoulder-width apart and tighten your abdominal  muscles.  Bend your knees and hips and keep your spine neutral. It is important to lift using the strength of your legs, not your back. Do not lock your knees straight out.  Always ask for help to lift heavy or awkward objects. This information is not intended to replace advice given to you by your health care provider. Make sure you discuss any questions you have with your health care provider. Document Released: 04/16/2005 Document Revised: 12/22/2015 Document Reviewed: 12/31/2014 Elsevier Interactive Patient Education  [employer].

## 2017-10-09 NOTE — Progress Notes (Addendum)
   HIGH-RISK PREGNANCY VISIT Patient name: Michelle Page MRN [id number]  Date of birth: Sep 07, 1975 Chief Complaint:   high risk ob  History of Present Illness:   Michelle Page is a 42 y.o. [id number] female at [redacted]w[redacted]d with an Estimated Date of Delivery: 01/28/18 being seen today for ongoing management of a high-risk pregnancy complicated by AMA.  Today she reports Rt sciatica. Went to chiropractor which helped. Missed some appts.  Contractions: Not present.  .  Movement: Present. denies leaking of fluid.  Review of Systems:   Pertinent items are noted in HPI Denies abnormal vaginal discharge w/ itching/odor/irritation, headaches, visual changes, shortness of breath, chest pain, abdominal pain, severe nausea/vomiting, or problems with urination or bowel movements unless otherwise stated above. Pertinent History Reviewed:  Reviewed past medical,surgical, social, obstetrical and family history.  Reviewed problem list, medications and allergies. Physical Assessment:   Vitals:   10/09/17 1341  BP: 97/64  Pulse: 78  Weight: 218 lb (98.9 kg)  Body mass index is 37.4 kg/m.           Physical Examination:   General appearance: alert, well appearing, and in no distress  Mental status: alert, oriented to person, place, and time  Skin: warm & dry   Extremities: Edema: None    Cardiovascular: normal heart rate noted  Respiratory: normal respiratory effort, no distress  Abdomen: gravid, soft, non-tender  Pelvic: Cervical exam deferred         Fetal Status: Fetal Heart Rate (bpm): 140 Fundal Height: 26 cm Movement: Present    Fetal Surveillance Testing today: doppler   Results for orders placed or performed in visit on 10/09/17 (from the past 24 hour(s))  POCT urinalysis dipstick   Collection Time: 10/09/17  1:48 PM  Result Value Ref Range   Color, UA     Clarity, UA     Glucose, UA Negative Negative   Bilirubin, UA     Ketones, UA neg    Spec Grav, UA  1.010 - 1.025   Blood, UA  neg    pH, UA  5.0 - 8.0   Protein, UA Negative Negative   Urobilinogen, UA  0.2 or 1.0 E.U./dL   Nitrite, UA neg    Leukocytes, UA Negative Negative   Appearance     Odor      Assessment & Plan:  1) High-risk pregnancy [id number] at [redacted]w[redacted]d with an Estimated Date of Delivery: 01/28/18   2) AMA, continue baby asa  3) H/O GHTN> continue baby asa  Meds: No orders of the defined types were placed in this encounter.   Labs/procedures today: none  Treatment Plan:  U/S @  28, 32, 36wks    2x/wk testing nst/sono @ 36wks    Deliver @ 40wks  Reviewed: Preterm labor symptoms and general obstetric precautions including but not limited to vaginal bleeding, contractions, leaking of fluid and fetal movement were reviewed in detail with the patient.  All questions were answered.  Follow-up: Return in about 3 weeks (around 10/30/2017) for HROB, US:EFW, PN2.  Orders Placed This Encounter  Procedures  . US OB Follow Up  . POCT urinalysis dipstick   Roma Schanz CNM, [facility] 10/09/2017 3:47 PM

## 2017-10-30 ENCOUNTER — Ambulatory Visit (INDEPENDENT_AMBULATORY_CARE_PROVIDER_SITE_OTHER): Payer: Medicaid Other | Admitting: Advanced Practice Midwife

## 2017-10-30 ENCOUNTER — Other Ambulatory Visit: Payer: Medicaid Other

## 2017-10-30 ENCOUNTER — Encounter: Payer: Self-pay | Admitting: Advanced Practice Midwife

## 2017-10-30 ENCOUNTER — Ambulatory Visit (INDEPENDENT_AMBULATORY_CARE_PROVIDER_SITE_OTHER): Payer: Medicaid Other

## 2017-10-30 VITALS — BP 118/79 | HR 85 | Wt 221.0 lb

## 2017-10-30 DIAGNOSIS — O0992 Supervision of high risk pregnancy, unspecified, second trimester: Secondary | ICD-10-CM

## 2017-10-30 DIAGNOSIS — Z3A27 27 weeks gestation of pregnancy: Secondary | ICD-10-CM

## 2017-10-30 DIAGNOSIS — O0990 Supervision of high risk pregnancy, unspecified, unspecified trimester: Secondary | ICD-10-CM

## 2017-10-30 DIAGNOSIS — Z131 Encounter for screening for diabetes mellitus: Secondary | ICD-10-CM

## 2017-10-30 DIAGNOSIS — Z23 Encounter for immunization: Secondary | ICD-10-CM | POA: Diagnosis not present

## 2017-10-30 DIAGNOSIS — O09522 Supervision of elderly multigravida, second trimester: Secondary | ICD-10-CM

## 2017-10-30 DIAGNOSIS — Z1389 Encounter for screening for other disorder: Secondary | ICD-10-CM

## 2017-10-30 DIAGNOSIS — Z331 Pregnant state, incidental: Secondary | ICD-10-CM

## 2017-10-30 LAB — POCT URINALYSIS DIPSTICK
Blood, UA: NEGATIVE
Glucose, UA: NEGATIVE
Ketones, UA: NEGATIVE
Leukocytes, UA: NEGATIVE
Nitrite, UA: NEGATIVE
Protein, UA: NEGATIVE

## 2017-10-30 MED ORDER — LIDOCAINE VISCOUS HCL 2 % MT SOLN: 5.0000 mL | OROMUCOSAL | 2 refills | Status: DC | PRN

## 2017-10-30 MED ORDER — OMEPRAZOLE 20 MG PO CPDR: 20.0000 mg | DELAYED_RELEASE_CAPSULE | Freq: Every day | ORAL | 6 refills | Status: DC

## 2017-10-30 NOTE — Progress Notes (Signed)
HIGH-RISK PREGNANCY VISIT Patient name: Michelle Page MRN [id number]  Date of birth: [birth year] Chief Complaint:   High Risk Gestation (PN2 and [place] today; acid reflux)  History of Present Illness:   Michelle Page is a 42 y.o. [id number] female at [redacted]w[redacted]d with an Estimated Date of Delivery: 01/28/18 being seen today for ongoing management of a low-risk pregnancy.  Today she reports acid reflux. Contractions: Not present. Vag. Bleeding: None.  Movement: Present. denies leaking of fluid. Review of Systems:   Pertinent items are noted in HPI Denies abnormal vaginal discharge w/ itching/odor/irritation, headaches, visual changes, shortness of breath, chest pain, abdominal pain, severe nausea/vomiting, or problems with urination or bowel movements unless otherwise stated above.  Pertinent History Reviewed:  Medical & Surgical Hx:   Past Medical History:  Diagnosis Date  . Abnormal Pap smear    colpo/leep  . Anemia   . Anxiety   . Cancer [facility]) 2006   Skin  . Chlamydia infection   . Depression   . Fractures   . H/O candidiasis   . H/O varicella   . Headache(784.0)    migraines as a child   . Heart murmur   . High risk HPV infection   . Papanicolaou smear of cervix with positive high risk human papilloma virus (HPV) test 06/03/2017  . Pregnancy induced hypertension    1st & 2nd pregnancy  . Pregnancy induced hypertension    1st & 2nd pregnancy   . Two vessel umbilical cord, antepartum 11/26/2011  . Vaginal Pap smear, abnormal   . Yeast infection    Past Surgical History:  Procedure Laterality Date  . APPENDECTOMY  08/14/2017  . COLPOSCOPY    . LAPAROSCOPIC APPENDECTOMY N/A 08/14/2017   Procedure: APPENDECTOMY LAPAROSCOPIC;  Surgeon: Erroll Luna, MD;  Location: [facility];  Service: General;  Laterality: N/A;  . LEEP    . WISDOM TOOTH EXTRACTION     Family History  Problem Relation Age of Onset  . Alcohol abuse Father   . Emphysema Father   . COPD Father   . Mental illness  Father        PTSD  . Alcohol abuse Brother   . Stroke Brother   . COPD Paternal Grandmother   . Cancer Paternal Grandmother        skin cancer  . Heart disease Paternal Grandfather        enlarged heart  . Multiple sclerosis Sister   . Depression Mother   . Asthma Son     Current Outpatient Medications:  .  Acetaminophen (TYLENOL PO), Take by mouth as needed., Disp: , Rfl:  .  aspirin EC 81 MG tablet, Take 81 mg by mouth daily., Disp: , Rfl:  .  diphenhydrAMINE (BENADRYL) 25 MG tablet, Take 25 mg by mouth every 6 (six) hours as needed for allergies., Disp: , Rfl:  .  Famotidine (PEPCID PO), Take by mouth. Takes 1.5 tabs qhs, Disp: , Rfl:  .  prenatal vitamin w/FE, FA (PRENATAL 1 + 1) 27-1 MG TABS tablet, Take 1 tablet by mouth daily at 12 noon., Disp: 30 each, Rfl: 12 .  lidocaine (XYLOCAINE) 2 % solution, Use as directed 5 mLs in the mouth or throat every 3 (three) hours as needed., Disp: 100 mL, Rfl: 2 .  omeprazole (PRILOSEC) 20 MG capsule, Take 1 capsule (20 mg total) by mouth daily., Disp: 30 capsule, Rfl: 6 Social History: Reviewed -  reports that she has quit smoking. Her smoking use  included cigarettes. She has never used smokeless tobacco.  Physical Assessment:   Vitals:   10/30/17 1011  BP: 118/79  Pulse: 85  Weight: 221 lb (100.2 kg)  Body mass index is 37.92 kg/m.        Physical Examination:   General appearance: Well appearing, and in no distress  Mental status: Alert, oriented to person, place, and time  Skin: Warm & dry  Cardiovascular: Normal heart rate noted  Respiratory: Normal respiratory effort, no distress  Abdomen: Soft, gravid, nontender  Pelvic: Cervical exam deferred         Extremities: Edema: None  Fetal Status: Fetal Heart Rate (bpm): 148   Movement: Present    Results for orders placed or performed in visit on 10/30/17 (from the past 24 hour(s))  POCT urinalysis dipstick   Collection Time: 10/30/17 10:12 AM  Result Value Ref Range    Color, UA     Clarity, UA     Glucose, UA Negative Negative   Bilirubin, UA     Ketones, UA neg    Spec Grav, UA  1.010 - 1.025   Blood, UA neg    pH, UA  5.0 - 8.0   Protein, UA Negative Negative   Urobilinogen, UA  0.2 or 1.0 E.U./dL   Nitrite, UA neg    Leukocytes, UA Negative Negative   Appearance     Odor      Assessment & Plan:  1) High risk pregnancy [id number] at [redacted]w[redacted]d with an Estimated Date of Delivery: 01/28/18   2) reflux, rx prilosec   Labs/procedures/US today: [place] 31+5 wks,cephalic,cx 3.5 cm,anterior pl gr 0,afi 16 cm,fhr 148 bpm,normal ovaries bilat,EFW 1352 g 97%,BPD 99%,HC 98%,AC 96%,FL 56%  PN2 today    Plan:  Monthly growth [place]; weekly BPP @ 36 weeks; IOL 40 weeks  Follow-up: Return in about 1 month (around 11/27/2017) for HROB, US:EFW, sign BTL form.  Orders Placed This Encounter  Procedures  . US OB Follow Up  . Tdap vaccine greater than or equal to 7yo IM  . POCT urinalysis dipstick   Christin Fudge CNM 10/30/2017 3:06 PM

## 2017-10-30 NOTE — Progress Notes (Signed)
[place] 28+2 wks,cephalic,cx 3.5 cm,anterior pl gr 0,afi 16 cm,fhr 148 bpm,normal ovaries bilat,EFW 1352 g 97%,BPD 99%,HC 98%,AC 96%,FL 56%

## 2017-10-30 NOTE — Patient Instructions (Signed)
Michelle Page, I greatly value your feedback.  If you receive a survey following your visit with [place] today, we appreciate you taking the time to fill it out.  Thanks, Nigel Berthold, CNM   Call the office [phone]) or go to [facility] if:  You begin to have strong, frequent contractions  Your water breaks.  Sometimes it is a big gush of fluid, sometimes it is just a trickle that keeps getting your panties wet or running down your legs  You have vaginal bleeding.  It is normal to have a small amount of spotting if your cervix was checked.   You don't feel your baby moving like normal.  If you don't, get you something to eat and drink and lay down and focus on feeling your baby move.  You should feel at least 10 movements in 2 hours.  If you don't, you should call the office or go to [facility].    Tdap Vaccine  It is recommended that you get the Tdap vaccine during the third trimester of EACH pregnancy to help protect your baby from getting pertussis (whooping cough)  27-36 weeks is the BEST time to do this so that you can pass the protection on to your baby. During pregnancy is better than after pregnancy, but if you are unable to get it during pregnancy it will be offered at the hospital.   You will be offered this vaccine in the office after 27 weeks. If you do not have health insurance, you can get this vaccine at the health department or your family doctor  Everyone who will be around your baby should also be up-to-date on their vaccines. Adults (who are not pregnant) only need 1 dose of Tdap during adulthood.   Third Trimester of Pregnancy The third trimester is from week 29 through week 42, months 7 through 9. The third trimester is a time when the fetus is growing rapidly. At the end of the ninth month, the fetus is about 20 inches in length and weighs 6-10 pounds.  BODY CHANGES Your body goes through many changes during pregnancy. The changes vary from woman  to woman.   Your weight will continue to increase. You can expect to gain 25-35 pounds (11-16 kg) by the end of the pregnancy.  You may begin to get stretch marks on your hips, abdomen, and breasts.  You may urinate more often because the fetus is moving lower into your pelvis and pressing on your bladder.  You may develop or continue to have heartburn as a result of your pregnancy.  You may develop constipation because certain hormones are causing the muscles that push waste through your intestines to slow down.  You may develop hemorrhoids or swollen, bulging veins (varicose veins).  You may have pelvic pain because of the weight gain and pregnancy hormones relaxing your joints between the bones in your pelvis. Backaches may result from overexertion of the muscles supporting your posture.  You may have changes in your hair. These can include thickening of your hair, rapid growth, and changes in texture. Some women also have hair loss during or after pregnancy, or hair that feels dry or thin. Your hair will most likely return to normal after your baby is born.  Your breasts will continue to grow and be tender. A yellow discharge may leak from your breasts called colostrum.  Your belly button may stick out.  You may feel short of breath because of your expanding uterus.  You may notice the fetus "dropping," or moving lower in your abdomen.  You may have a bloody mucus discharge. This usually occurs a few days to a week before labor begins.  Your cervix becomes thin and soft (effaced) near your due date. WHAT TO EXPECT AT YOUR PRENATAL EXAMS  You will have prenatal exams every 2 weeks until week 36. Then, you will have weekly prenatal exams. During a routine prenatal visit:  You will be weighed to make sure you and the fetus are growing normally.  Your blood pressure is taken.  Your abdomen will be measured to track your baby's growth.  The fetal heartbeat will be listened  to.  Any test results from the previous visit will be discussed.  You may have a cervical check near your due date to see if you have effaced. At around 36 weeks, your caregiver will check your cervix. At the same time, your caregiver will also perform a test on the secretions of the vaginal tissue. This test is to determine if a type of bacteria, Group B streptococcus, is present. Your caregiver will explain this further. Your caregiver may ask you:  What your birth plan is.  How you are feeling.  If you are feeling the baby move.  If you have had any abnormal symptoms, such as leaking fluid, bleeding, severe headaches, or abdominal cramping.  If you have any questions. Other tests or screenings that may be performed during your third trimester include:  Blood tests that check for low iron levels (anemia).  Fetal testing to check the health, activity level, and growth of the fetus. Testing is done if you have certain medical conditions or if there are problems during the pregnancy. FALSE LABOR You may feel small, irregular contractions that eventually go away. These are called Braxton Hicks contractions, or false labor. Contractions may last for hours, days, or even weeks before true labor sets in. If contractions come at regular intervals, intensify, or become painful, it is best to be seen by your caregiver.  SIGNS OF LABOR   Menstrual-like cramps.  Contractions that are 5 minutes apart or less.  Contractions that start on the top of the uterus and spread down to the lower abdomen and back.  A sense of increased pelvic pressure or back pain.  A watery or bloody mucus discharge that comes from the vagina. If you have any of these signs before the 37th week of pregnancy, call your caregiver right away. You need to go to the hospital to get checked immediately. HOME CARE INSTRUCTIONS   Avoid all smoking, herbs, alcohol, and unprescribed drugs. These chemicals affect the  formation and growth of the baby.  Follow your caregiver's instructions regarding medicine use. There are medicines that are either safe or unsafe to take during pregnancy.  Exercise only as directed by your caregiver. Experiencing uterine cramps is a good sign to stop exercising.  Continue to eat regular, healthy meals.  Wear a good support bra for breast tenderness.  Do not use hot tubs, steam rooms, or saunas.  Wear your seat belt at all times when driving.  Avoid raw meat, uncooked cheese, cat litter boxes, and soil used by cats. These carry germs that can cause birth defects in the baby.  Take your prenatal vitamins.  Try taking a stool softener (if your caregiver approves) if you develop constipation. Eat more high-fiber foods, such as fresh vegetables or fruit and whole grains. Drink plenty of fluids to keep your urine  clear or pale yellow.  Take warm sitz baths to soothe any pain or discomfort caused by hemorrhoids. Use hemorrhoid cream if your caregiver approves.  If you develop varicose veins, wear support hose. Elevate your feet for 15 minutes, 3-4 times a day. Limit salt in your diet.  Avoid heavy lifting, wear low heal shoes, and practice good posture.  Rest a lot with your legs elevated if you have leg cramps or low back pain.  Visit your dentist if you have not gone during your pregnancy. Use a soft toothbrush to brush your teeth and be gentle when you floss.  A sexual relationship may be continued unless your caregiver directs you otherwise.  Do not travel far distances unless it is absolutely necessary and only with the approval of your caregiver.  Take prenatal classes to understand, practice, and ask questions about the labor and delivery.  Make a trial run to the hospital.  Pack your hospital bag.  Prepare the baby's nursery.  Continue to go to all your prenatal visits as directed by your caregiver. SEEK MEDICAL CARE IF:  You are unsure if you are in  labor or if your water has broken.  You have dizziness.  You have mild pelvic cramps, pelvic pressure, or nagging pain in your abdominal area.  You have persistent nausea, vomiting, or diarrhea.  You have a bad smelling vaginal discharge.  You have pain with urination. SEEK IMMEDIATE MEDICAL CARE IF:   You have a fever.  You are leaking fluid from your vagina.  You have spotting or bleeding from your vagina.  You have severe abdominal cramping or pain.  You have rapid weight loss or gain.  You have shortness of breath with chest pain.  You notice sudden or extreme swelling of your face, hands, ankles, feet, or legs.  You have not felt your baby move in over an hour.  You have severe headaches that do not go away with medicine.  You have vision changes. Document Released: 04/10/2001 Document Revised: 04/21/2013 Document Reviewed: 06/17/2012 [facility] Patient Information 2015 [place], [place]. This information is not intended to replace advice given to you by your health care provider. Make sure you discuss any questions you have with your health care provider.

## 2017-10-31 ENCOUNTER — Encounter: Payer: Self-pay | Admitting: Advanced Practice Midwife

## 2017-10-31 DIAGNOSIS — O24419 Gestational diabetes mellitus in pregnancy, unspecified control: Secondary | ICD-10-CM | POA: Insufficient documentation

## 2017-10-31 LAB — CBC
Hematocrit: 32.7 % — ABNORMAL LOW (ref 34.0–46.6)
Hemoglobin: 11.1 g/dL (ref 11.1–15.9)
MCH: 30.1 pg (ref 26.6–33.0)
MCHC: 33.9 g/dL (ref 31.5–35.7)
MCV: 89 fL (ref 79–97)
Platelets: 176 10*3/uL (ref 150–450)
RBC: 3.69 x10E6/uL — ABNORMAL LOW (ref 3.77–5.28)
RDW: 13.5 % (ref 12.3–15.4)
WBC: 7 10*3/uL (ref 3.4–10.8)

## 2017-10-31 LAB — RPR: RPR Ser Ql: NONREACTIVE

## 2017-10-31 LAB — GLUCOSE TOLERANCE, 2 HOURS W/ 1HR
Glucose, 1 hour: 189 mg/dL — ABNORMAL HIGH (ref 65–179)
Glucose, 2 hour: 160 mg/dL — ABNORMAL HIGH (ref 65–152)
Glucose, Fasting: 87 mg/dL (ref 65–91)

## 2017-10-31 LAB — HIV ANTIBODY (ROUTINE TESTING W REFLEX): HIV Screen 4th Generation wRfx: NONREACTIVE

## 2017-10-31 LAB — ANTIBODY SCREEN: Antibody Screen: NEGATIVE

## 2017-11-01 ENCOUNTER — Telehealth: Payer: Self-pay | Admitting: *Deleted

## 2017-11-01 ENCOUNTER — Other Ambulatory Visit: Payer: Self-pay | Admitting: *Deleted

## 2017-11-01 DIAGNOSIS — O0990 Supervision of high risk pregnancy, unspecified, unspecified trimester: Secondary | ICD-10-CM

## 2017-11-01 DIAGNOSIS — O24419 Gestational diabetes mellitus in pregnancy, unspecified control: Secondary | ICD-10-CM

## 2017-11-01 NOTE — Telephone Encounter (Signed)
Patient called stating she saw her glucola results on mychart and is concerned.  Informed patient she did not pass.  Pt very upset but encouraged patient to just monitor diet and hopefully she will not require meds. Informed she will be checking her CBG 4 times daily and will need to bring the log to all her appointments.  Will also set up a referral to the dietician and should hear from them in about a week, if not to let me know.  Pt verbalized understanding.

## 2017-11-01 NOTE — Telephone Encounter (Signed)
GLUCOMETER, lancets and strips called to pharmcy

## 2017-11-13 ENCOUNTER — Encounter: Payer: Medicaid Other | Attending: Advanced Practice Midwife | Admitting: Registered"

## 2017-11-13 DIAGNOSIS — O0990 Supervision of high risk pregnancy, unspecified, unspecified trimester: Secondary | ICD-10-CM | POA: Diagnosis not present

## 2017-11-13 DIAGNOSIS — O24419 Gestational diabetes mellitus in pregnancy, unspecified control: Secondary | ICD-10-CM | POA: Diagnosis not present

## 2017-11-13 DIAGNOSIS — Z3A00 Weeks of gestation of pregnancy not specified: Secondary | ICD-10-CM | POA: Diagnosis not present

## 2017-11-13 DIAGNOSIS — Z713 Dietary counseling and surveillance: Secondary | ICD-10-CM | POA: Insufficient documentation

## 2017-11-13 DIAGNOSIS — O09529 Supervision of elderly multigravida, unspecified trimester: Secondary | ICD-10-CM | POA: Insufficient documentation

## 2017-11-18 ENCOUNTER — Encounter: Payer: Self-pay | Admitting: Registered"

## 2017-11-18 NOTE — Progress Notes (Signed)
Patient was seen on 11/13/2017 for Gestational Diabetes self-management class at the [facility]. The following learning objectives were met by the patient during this course:   States the definition of Gestational Diabetes  States why dietary management is important in controlling blood glucose  Describes the effects each nutrient has on blood glucose levels  Demonstrates ability to create a balanced meal plan  Demonstrates carbohydrate counting   States when to check blood glucose levels  Demonstrates proper blood glucose monitoring techniques  States the effect of stress and exercise on blood glucose levels  States the importance of limiting caffeine and abstaining from alcohol and smoking  Blood glucose monitor given: none Lot # n/a Exp: n/a Blood glucose reading: 84  Patient instructed to monitor glucose levels: FBS: 60 - <95; 1 hour: <140; 2 hour: <120  Patient received handouts:  Nutrition Diabetes and Pregnancy, including carb counting list  Patient will be seen for follow-up as needed.

## 2017-11-28 ENCOUNTER — Ambulatory Visit (INDEPENDENT_AMBULATORY_CARE_PROVIDER_SITE_OTHER): Payer: Medicaid Other | Admitting: Women's Health

## 2017-11-28 ENCOUNTER — Ambulatory Visit (INDEPENDENT_AMBULATORY_CARE_PROVIDER_SITE_OTHER): Payer: Medicaid Other

## 2017-11-28 ENCOUNTER — Encounter: Payer: Self-pay | Admitting: Women's Health

## 2017-11-28 VITALS — BP 106/65 | HR 79 | Wt 219.0 lb

## 2017-11-28 DIAGNOSIS — Z331 Pregnant state, incidental: Secondary | ICD-10-CM

## 2017-11-28 DIAGNOSIS — O0992 Supervision of high risk pregnancy, unspecified, second trimester: Secondary | ICD-10-CM

## 2017-11-28 DIAGNOSIS — O09522 Supervision of elderly multigravida, second trimester: Secondary | ICD-10-CM

## 2017-11-28 DIAGNOSIS — O0993 Supervision of high risk pregnancy, unspecified, third trimester: Secondary | ICD-10-CM

## 2017-11-28 DIAGNOSIS — Z1389 Encounter for screening for other disorder: Secondary | ICD-10-CM

## 2017-11-28 DIAGNOSIS — O0990 Supervision of high risk pregnancy, unspecified, unspecified trimester: Secondary | ICD-10-CM

## 2017-11-28 DIAGNOSIS — O09523 Supervision of elderly multigravida, third trimester: Secondary | ICD-10-CM

## 2017-11-28 DIAGNOSIS — Z3A31 31 weeks gestation of pregnancy: Secondary | ICD-10-CM

## 2017-11-28 DIAGNOSIS — O24419 Gestational diabetes mellitus in pregnancy, unspecified control: Secondary | ICD-10-CM

## 2017-11-28 LAB — POCT URINALYSIS DIPSTICK OB
Blood, UA: NEGATIVE
Glucose, UA: NEGATIVE — AB
Ketones, UA: NEGATIVE
Leukocytes, UA: NEGATIVE
Nitrite, UA: NEGATIVE
POC,PROTEIN,UA: NEGATIVE

## 2017-11-28 MED ORDER — METFORMIN HCL 500 MG PO TABS
500.0000 mg | ORAL_TABLET | Freq: Two times a day (BID) | ORAL | 3 refills | Status: DC
Start: 2017-11-28 — End: 2018-01-24

## 2017-11-28 NOTE — Progress Notes (Signed)
[place] 54+9 wks,cephalic,anterior pl gr 1,normal ovaries bilat,afi 17 cm,fhr 132 bpm,cx 3.1 cm,EFW 2389 g 99%

## 2017-11-28 NOTE — Patient Instructions (Signed)
Michelle Page, I greatly value your feedback.  If you receive a survey following your visit with [place] today, we appreciate you taking the time to fill it out.  Thanks, [name], CNM, WHNP-BC   Call the office [phone]) or go to [facility] if:  You begin to have strong, frequent contractions  Your water breaks.  Sometimes it is a big gush of fluid, sometimes it is just a trickle that keeps getting your panties wet or running down your legs  You have vaginal bleeding.  It is normal to have a small amount of spotting if your cervix was checked.   You don't feel your baby moving like normal.  If you don't, get you something to eat and drink and lay down and focus on feeling your baby move.  You should feel at least 10 movements in 2 hours.  If you don't, you should call the office or go to [facility].    Gestational Diabetes Mellitus, Self Care Caring for yourself after you have been diagnosed with gestational diabetes (gestational diabetes mellitus) means keeping your blood sugar (glucose) under control with a balance of:  Nutrition.  Exercise.  Lifestyle changes.  Medicines or insulin, if necessary.  Support from your team of health care providers and others.  The following information explains what you need to know to manage your gestational diabetes at home. What do I need to do to manage my blood glucose?  Check your blood glucose every day during your pregnancy. Do this as often as told by your health care provider.  Contact your health care provider if your blood glucose is above your target for 2 tests in a row. Your health care provider will set individualized treatment goals for you. Generally, the goal of treatment is to maintain the following blood glucose levels during pregnancy:  After not eating for 8 hours (after fasting): at or below 95 mg/dL (5.3 mmol/L).  After meals (postprandial): ? One hour after a meal: at or below 140 mg/dL (7.8  mmol/L). ? Two hours after a meal: at or below 120 mg/dL (6.7 mmol/L).  A1c (hemoglobin A1c) level: 6-6.5%.  What do I need to know about hyperglycemia and hypoglycemia? What is hyperglycemia? Hyperglycemia, also called high blood glucose, occurs when blood glucose is too high. Make sure you know the early signs of hyperglycemia, such as:  Increased thirst.  Hunger.  Feeling very tired.  Needing to urinate more often than usual.  Blurry vision.  What is hypoglycemia? Hypoglycemia, also called low blood glucose, occurswith a blood glucose level at or below 70 mg/dL (3.9 mmol/L). The risk for hypoglycemia increases during or after exercise, during sleep, during illness, and when skipping meals or not eating for a long time (fasting). It is important to know the symptoms of hypoglycemia and treat it right away. Always have a 15-gram rapid-acting carbohydrate snack with you to treat low blood glucose.Family members and close friends should also know the symptoms and should understand how to treat hypoglycemia, in case you are not able to treat yourself. What are the symptoms of hypoglycemia? Hypoglycemia symptoms can include:  Hunger.  Anxiety.  Sweating and feeling clammy.  Confusion.  Dizziness or feeling light-headed.  Sleepiness.  Nausea.  Increased heart rate.  Headache.  Blurry vision.  Seizure.  Nightmares.  Tingling or numbness around the mouth, lips, or tongue.  A change in speech.  Decreased ability to concentrate.  A change in coordination.  Restless sleep.  Tremors  or shakes.  Fainting.  Irritability.  How do I treat hypoglycemia?  If you are alert and able to swallow safely, follow the 15:15 rule:  Take 15 grams of a rapid-acting carbohydrate. Rapid-acting options include: ? 1 tube of glucose gel. ? 3 glucose pills. ? 6-8 pieces of hard candy. ? 4 oz (120 mL) of fruit juice. ? 4 oz (120 mL) of regular (not diet) soda.  Check your  blood glucose 15 minutes after you take the carbohydrate.  If the repeat blood glucose level is still at or below 70 mg/dL (3.9 mmol/L), take 15 grams of a carbohydrate again.  If your blood glucose level does not increase above 70 mg/dL (3.9 mmol/L) after 3 tries, seek emergency medical care.  After your blood glucose level returns to normal, eat a meal or a snack within 1 hour.  How do I treat severe hypoglycemia? Severe hypoglycemia is when your blood glucose level is at or below 54 mg/dL (3 mmol/L). Severe hypoglycemia is an emergency. Do not wait to see if the symptoms will go away. Get medical help right away. Call your local emergency services (911 in the [place] Do not drive yourself to the hospital. If you have severe hypoglycemia and you cannot eat or drink, you may need an injection of glucagon. A family member or close friend should learn how to check your blood glucose and how to give you a glucagon injection. Ask your health care provider if you need to have an emergency glucagon injection kit available. Severe hypoglycemia may need to be treated in a hospital. The treatment may include getting glucose through an IV tube. You may also need treatment for the cause of your hypoglycemia. What else can I do to manage my gestational diabetes? Take your diabetes medicines as told  If your health care provider prescribed insulin or diabetes medicines, take them every day.  Do not run out of insulin or other diabetes medicines that you take. Plan ahead so you always have these available.  If you use insulin, adjust your dosage based on how physically active you are and what foods you eat. Your health care provider will tell you how to adjust your dosage. Make healthy food choices  The things that you eat and drink affect your blood glucose. Making good choices helps to control your diabetes and prevent other health problems. A healthy meal plan includes eating lean proteins, complex  carbohydrates, fresh fruits and vegetables, low-fat dairy products, and healthy fats. Make an appointment to see a diet and nutrition specialist (registered dietitian) to help you create an eating plan that is right for you. Make sure that you:  Follow instructions from your health care provider about eating or drinking restrictions.  Drink enough fluid to keep your urine clear or pale yellow.  Eat healthy snacks between nutritious meals.  Track the carbohydrates that you eat. Do this by reading food labels and learning the standard serving sizes of foods.  Follow your sick day plan whenever you cannot eat or drink as usual. Make this plan in advance with your health care provider.  Stay active   Do at least 30 minutes of physical activity a day, or as much physical activity as your health care provider recommends during your pregnancy. ? Doing 10 minutes of exercise 30 minutes after each meal may help to control postprandial blood glucose levels.  If you start a new exercise or activity, work with your health care provider to adjust your insulin,  medicines, or food intake as needed. Make healthy lifestyle choices  Do not drink alcohol.  Do not use any tobacco products, such as cigarettes, chewing tobacco, and e-cigarettes. If you need help quitting, ask your health care provider.  Learn to manage stress. If you need help with this, ask your health care provider. Care for your body  Keep your immunizations up to date.  Brush your teeth and gums two times a day, and floss at least one time a day.  Visit your dentist at least once every 6 months.  Maintain a healthy weight during your pregnancy. General instructions   Take over-the-counter and prescription medicines only as told by your health care provider.  Talk with your health care provider about your risk for high blood pressure during pregnancy (preeclampsia or eclampsia).  Share your diabetes management plan with  people in your workplace, school, and household.  Check your urine for ketones during your pregnancy when you are ill and as told by your health care provider.  Carry a medical alert card or wear medical alert jewelry.  Ask your health care provider: ? Do I need to meet with a diabetes educator? ? Where can I find a support group for people with diabetes?  Keep all follow-up visits during your pregnancy (prenatal) and after delivery (postnatal) as told by your health care provider. This is important. Get the care that you need after delivery  Have your blood glucose level checked 4-12 weeks after delivery. This is done with an oral glucose tolerance test (OGTT).  Get screened for diabetes at least every 3 years, or as often as told by your health care provider. Where to find more information: To learn more about gestational diabetes, visit:  American Diabetes Association (ADA): www.diabetes.org/diabetes-basics/gestational  Centers for Disease Control and Prevention (CDC): http://sanchez-watson.com/.pdf  This information is not intended to replace advice given to you by your health care provider. Make sure you discuss any questions you have with your health care provider. Document Released: 08/08/2015 Document Revised: 09/22/2015 Document Reviewed: 05/20/2015 Elsevier Interactive Patient Education  [employer].

## 2017-11-28 NOTE — Progress Notes (Signed)
HIGH-RISK PREGNANCY VISIT Patient name: Michelle Page MRN [id number]  Date of birth: 12/20/1975 Chief Complaint:   Routine Prenatal Visit  History of Present Illness:   Michelle Page is a 42 y.o. [id number] female at [redacted]w[redacted]d with an Estimated Date of Delivery: 01/28/18 being seen today for ongoing management of a high-risk pregnancy complicated by AMA, E0PQ- meds added today, suspected LGA 99% Today she reports fbs 83-106 (majority >95), 2hr pp 86-181 (maybe 1/3 >120). States she has drastically changed diet. Contractions: Not present. Vag. Bleeding: None.  Movement: Present. denies leaking of fluid.  Review of Systems:   Pertinent items are noted in HPI Denies abnormal vaginal discharge w/ itching/odor/irritation, headaches, visual changes, shortness of breath, chest pain, abdominal pain, severe nausea/vomiting, or problems with urination or bowel movements unless otherwise stated above. Pertinent History Reviewed:  Reviewed past medical,surgical, social, obstetrical and family history.  Reviewed problem list, medications and allergies. Physical Assessment:   Vitals:   11/28/17 1434  BP: 106/65  Pulse: 79  Weight: 219 lb (99.3 kg)  Body mass index is 37.57 kg/m.           Physical Examination:   General appearance: alert, well appearing, and in no distress  Mental status: alert, oriented to person, place, and time  Skin: warm & dry   Extremities: Edema: None    Cardiovascular: normal heart rate noted  Respiratory: normal respiratory effort, no distress  Abdomen: gravid, soft, non-tender  Pelvic: Cervical exam deferred         Fetal Status: Fetal Heart Rate (bpm): 132 u/s   Movement: Present    Fetal Surveillance Testing today: [place] 33+0 wks,cephalic,anterior pl gr 1,normal ovaries bilat,afi 17 cm,fhr 132 bpm,cx 3.1 cm,EFW 2389 g 99%  Results for orders placed or performed in visit on 11/28/17 (from the past 24 hour(s))  POC Urinalysis Dipstick OB   Collection Time:  11/28/17  2:39 PM  Result Value Ref Range   Color, UA     Clarity, UA     Glucose, UA Negative (A) (none)   Bilirubin, UA     Ketones, UA neg    Spec Grav, UA  1.010 - 1.025   Blood, UA neg    pH, UA  5.0 - 8.0   POC Protein UA Negative Negative, Trace   Urobilinogen, UA  0.2 or 1.0 E.U./dL   Nitrite, UA neg    Leukocytes, UA Negative Negative   Appearance     Odor      Assessment & Plan:  1) High-risk pregnancy [id number] at [redacted]w[redacted]d with an Estimated Date of Delivery: 01/28/18   2) AMA, continue baby ASA  3) A2DM, unstable, rx metformin 500mg  BID, discussed importance of strict glycemic control and adherence to low carb diet during pregnancy as well as potential complications from uncontrolled diabetes during pregnancy.  4) Suspected LGA> 99% today   Meds:  Meds ordered this encounter  Medications  . metFORMIN (GLUCOPHAGE) 500 MG tablet    Sig: Take 1 tablet (500 mg total) by mouth 2 (two) times daily with a meal.    Dispense:  60 tablet    Refill:  3    Order Specific Question:   Supervising Provider    Answer:   Florian Buff [[id number]]    Labs/procedures today: efw/afi u/s  Treatment Plan:  Growth u/s @ 36-38wks     32wks weekly BPP (pt prefers this to 2x/wk NST), then 2x/wk testing nst/sono @ 36wks d/t AMA  Deliver @ 39wks      Reviewed: Preterm labor symptoms and general obstetric precautions including but not limited to vaginal bleeding, contractions, leaking of fluid and fetal movement were reviewed in detail with the patient.  All questions were answered.  Follow-up: Return for next week for hrob, bpp (x 4wks).  Orders Placed This Encounter  Procedures  . US FETAL BPP WO NON STRESS  . POC Urinalysis Dipstick OB   Roma Schanz CNM, [facility] 11/28/2017 4:12 PM

## 2017-12-03 ENCOUNTER — Ambulatory Visit (INDEPENDENT_AMBULATORY_CARE_PROVIDER_SITE_OTHER): Payer: Medicaid Other

## 2017-12-03 ENCOUNTER — Encounter: Payer: Self-pay | Admitting: Obstetrics & Gynecology

## 2017-12-03 ENCOUNTER — Ambulatory Visit (INDEPENDENT_AMBULATORY_CARE_PROVIDER_SITE_OTHER): Payer: Medicaid Other | Admitting: Obstetrics & Gynecology

## 2017-12-03 ENCOUNTER — Other Ambulatory Visit: Payer: Self-pay

## 2017-12-03 VITALS — BP 113/70 | HR 82 | Wt 218.0 lb

## 2017-12-03 DIAGNOSIS — O24419 Gestational diabetes mellitus in pregnancy, unspecified control: Secondary | ICD-10-CM | POA: Diagnosis not present

## 2017-12-03 DIAGNOSIS — O24415 Gestational diabetes mellitus in pregnancy, controlled by oral hypoglycemic drugs: Secondary | ICD-10-CM

## 2017-12-03 DIAGNOSIS — O0993 Supervision of high risk pregnancy, unspecified, third trimester: Secondary | ICD-10-CM

## 2017-12-03 DIAGNOSIS — O09523 Supervision of elderly multigravida, third trimester: Secondary | ICD-10-CM

## 2017-12-03 DIAGNOSIS — Z331 Pregnant state, incidental: Secondary | ICD-10-CM

## 2017-12-03 DIAGNOSIS — O0990 Supervision of high risk pregnancy, unspecified, unspecified trimester: Secondary | ICD-10-CM

## 2017-12-03 DIAGNOSIS — Z3A32 32 weeks gestation of pregnancy: Secondary | ICD-10-CM

## 2017-12-03 DIAGNOSIS — Z1389 Encounter for screening for other disorder: Secondary | ICD-10-CM

## 2017-12-03 LAB — POCT URINALYSIS DIPSTICK OB
Blood, UA: NEGATIVE
Glucose, UA: NEGATIVE — AB
Ketones, UA: NEGATIVE
Leukocytes, UA: NEGATIVE
Nitrite, UA: NEGATIVE
POC,PROTEIN,UA: NEGATIVE

## 2017-12-03 NOTE — Progress Notes (Signed)
   HIGH-RISK PREGNANCY VISIT Patient name: Michelle Page MRN [id number]  Date of birth: 11-Jan-1976 Chief Complaint:   High Risk Gestation (u/s today)  History of Present Illness:   Michelle Page is a 42 y.o. [id number] female at [redacted]w[redacted]d with an Estimated Date of Delivery: 01/28/18 being seen today for ongoing management of a high-risk pregnancy complicated by class  A2 DM.  Today she reports no complaints. Contractions: Not present. Vag. Bleeding: None.  Movement: Present. denies leaking of fluid.  Review of Systems:   Pertinent items are noted in HPI Denies abnormal vaginal discharge w/ itching/odor/irritation, headaches, visual changes, shortness of breath, chest pain, abdominal pain, severe nausea/vomiting, or problems with urination or bowel movements unless otherwise stated above. Pertinent History Reviewed:  Reviewed past medical,surgical, social, obstetrical and family history.  Reviewed problem list, medications and allergies. Physical Assessment:   Vitals:   12/03/17 1623  BP: 113/70  Pulse: 82  Weight: 218 lb (98.9 kg)  Body mass index is 37.4 kg/m.           Physical Examination:   General appearance: alert, well appearing, and in no distress  Mental status: alert, oriented to person, place, and time  Skin: warm & dry   Extremities: Edema: None    Cardiovascular: normal heart rate noted  Respiratory: normal respiratory effort, no distress  Abdomen: gravid, soft, non-tender  Pelvic: Cervical exam deferred         Fetal Status: Fetal Heart Rate (bpm): 129   Movement: Present    Fetal Surveillance Testing today: BPP 8/8   Results for orders placed or performed in visit on 12/03/17 (from the past 24 hour(s))  POC Urinalysis Dipstick OB   Collection Time: 12/03/17  4:24 PM  Result Value Ref Range   Color, UA     Clarity, UA     Glucose, UA Negative (A) (none)   Bilirubin, UA     Ketones, UA neg    Spec Grav, UA  1.010 - 1.025   Blood, UA neg    pH, UA  5.0 -  8.0   POC Protein UA Negative Negative, Trace   Urobilinogen, UA  0.2 or 1.0 E.U./dL   Nitrite, UA neg    Leukocytes, UA Negative Negative   Appearance     Odor      Assessment & Plan:  1) High-risk pregnancy [id number] at [redacted]w[redacted]d with an Estimated Date of Delivery: 01/28/18   2) Class A2 DM, stable, metformin 500 BID, CBG are good  3) Suspected fetal macrosomia, unstable,will evaluate growth at 36-37 weeks  Meds: No orders of the defined types were placed in this encounter.   Labs/procedures today: BPP 8/8  Treatment Plan:  Weekly BPP, pt prefers over twice weekly NST  Reviewed: Term labor symptoms and general obstetric precautions including but not limited to vaginal bleeding, contractions, leaking of fluid and fetal movement were reviewed in detail with the patient.  All questions were answered.  Follow-up: Return in about 1 week (around 12/10/2017) for BPP/sono.  Orders Placed This Encounter  Procedures  . POC Urinalysis Dipstick OB   Florian Buff  12/03/2017 4:52 PM

## 2017-12-03 NOTE — Progress Notes (Signed)
[place] 32 wks,cephalic,BPP 6/8,PCWTP adnexa's wnl,fhr 129 bpm,anterior pl gr 1,afi 17 cm

## 2017-12-09 ENCOUNTER — Other Ambulatory Visit: Payer: Self-pay | Admitting: Obstetrics & Gynecology

## 2017-12-09 DIAGNOSIS — O24410 Gestational diabetes mellitus in pregnancy, diet controlled: Secondary | ICD-10-CM

## 2017-12-09 DIAGNOSIS — O09523 Supervision of elderly multigravida, third trimester: Secondary | ICD-10-CM

## 2017-12-10 ENCOUNTER — Ambulatory Visit (INDEPENDENT_AMBULATORY_CARE_PROVIDER_SITE_OTHER): Payer: Medicaid Other | Admitting: Obstetrics & Gynecology

## 2017-12-10 ENCOUNTER — Ambulatory Visit (INDEPENDENT_AMBULATORY_CARE_PROVIDER_SITE_OTHER): Payer: Medicaid Other

## 2017-12-10 ENCOUNTER — Encounter: Payer: Self-pay | Admitting: Obstetrics & Gynecology

## 2017-12-10 VITALS — BP 115/68 | HR 80 | Wt 218.0 lb

## 2017-12-10 DIAGNOSIS — O09523 Supervision of elderly multigravida, third trimester: Secondary | ICD-10-CM | POA: Diagnosis not present

## 2017-12-10 DIAGNOSIS — O0990 Supervision of high risk pregnancy, unspecified, unspecified trimester: Secondary | ICD-10-CM

## 2017-12-10 DIAGNOSIS — Z3A33 33 weeks gestation of pregnancy: Secondary | ICD-10-CM

## 2017-12-10 DIAGNOSIS — Z1389 Encounter for screening for other disorder: Secondary | ICD-10-CM

## 2017-12-10 DIAGNOSIS — O24410 Gestational diabetes mellitus in pregnancy, diet controlled: Secondary | ICD-10-CM | POA: Diagnosis not present

## 2017-12-10 DIAGNOSIS — O24419 Gestational diabetes mellitus in pregnancy, unspecified control: Secondary | ICD-10-CM

## 2017-12-10 DIAGNOSIS — Z331 Pregnant state, incidental: Secondary | ICD-10-CM

## 2017-12-10 DIAGNOSIS — O0993 Supervision of high risk pregnancy, unspecified, third trimester: Secondary | ICD-10-CM

## 2017-12-10 LAB — POCT URINALYSIS DIPSTICK OB
Blood, UA: NEGATIVE
Glucose, UA: NEGATIVE — AB
Leukocytes, UA: NEGATIVE
Nitrite, UA: NEGATIVE
POC,PROTEIN,UA: NEGATIVE

## 2017-12-10 NOTE — Progress Notes (Signed)
   HIGH-RISK PREGNANCY VISIT Patient name: Michelle Page MRN [id number]  Date of birth: [birth year] Chief Complaint:   Routine Prenatal Visit ([place] today)  History of Present Illness:   Michelle Page is a 42 y.o. [id number] female at [redacted]w[redacted]d with an Estimated Date of Delivery: 01/28/18 being seen today for ongoing management of a high-risk pregnancy complicated by class  A2 DM.  Today she reports no complaints. Contractions: Not present. Vag. Bleeding: None.  Movement: Present. denies leaking of fluid.  Review of Systems:   Pertinent items are noted in HPI Denies abnormal vaginal discharge w/ itching/odor/irritation, headaches, visual changes, shortness of breath, chest pain, abdominal pain, severe nausea/vomiting, or problems with urination or bowel movements unless otherwise stated above. Pertinent History Reviewed:  Reviewed past medical,surgical, social, obstetrical and family history.  Reviewed problem list, medications and allergies. Physical Assessment:   Vitals:   12/10/17 1547  BP: 115/68  Pulse: 80  Weight: 218 lb (98.9 kg)  Body mass index is 37.4 kg/m.           Physical Examination:   General appearance: alert, well appearing, and in no distress  Mental status: alert, oriented to person, place, and time  Skin: warm & dry   Extremities: Edema: None    Cardiovascular: normal heart rate noted  Respiratory: normal respiratory effort, no distress  Abdomen: gravid, soft, non-tender  Pelvic: Cervical exam deferred         Fetal Status:     Movement: Present    Fetal Surveillance Testing today: BPP 8/8   Results for orders placed or performed in visit on 12/10/17 (from the past 24 hour(s))  POC Urinalysis Dipstick OB   Collection Time: 12/10/17  3:47 PM  Result Value Ref Range   Color, UA     Clarity, UA     Glucose, UA Negative (A) (none)   Bilirubin, UA     Ketones, UA large    Spec Grav, UA     Blood, UA neg    pH, UA     POC Protein UA Negative Negative,  Trace   Urobilinogen, UA     Nitrite, UA neg    Leukocytes, UA Negative Negative   Appearance     Odor      Assessment & Plan:  1) High-risk pregnancy [id number] at [redacted]w[redacted]d with an Estimated Date of Delivery: 01/28/18   2) Class A2 DM, stable, CBG are good for the most part only 1 elevated fasting  3) macrosomia suspected, stable  Meds: No orders of the defined types were placed in this encounter.   Labs/procedures today: sonogram BPP 8/8  Treatment Plan:  Weekly BPP(pt request) for A2 DM surveillance  Reviewed: Preterm labor symptoms and general obstetric precautions including but not limited to vaginal bleeding, contractions, leaking of fluid and fetal movement were reviewed in detail with the patient.  All questions were answered.  Follow-up: Return in about 1 week (around 12/17/2017) for BPP/sono, HROB.   Orders Placed This Encounter  Procedures  . POC Urinalysis Dipstick OB   Florian Buff  12/10/2017 4:37 PM

## 2017-12-10 NOTE — Progress Notes (Signed)
[place] 33 wks,cephalic,anterior placenta gr 2,bilat adnexa's wnl,afi 16 cm,BPP 8/8,FHR 135 BPM

## 2017-12-16 ENCOUNTER — Other Ambulatory Visit: Payer: Self-pay | Admitting: Obstetrics & Gynecology

## 2017-12-16 DIAGNOSIS — O09523 Supervision of elderly multigravida, third trimester: Secondary | ICD-10-CM

## 2017-12-16 DIAGNOSIS — O24410 Gestational diabetes mellitus in pregnancy, diet controlled: Secondary | ICD-10-CM

## 2017-12-17 ENCOUNTER — Ambulatory Visit (INDEPENDENT_AMBULATORY_CARE_PROVIDER_SITE_OTHER): Payer: Medicaid Other | Admitting: Women's Health

## 2017-12-17 ENCOUNTER — Ambulatory Visit (INDEPENDENT_AMBULATORY_CARE_PROVIDER_SITE_OTHER): Payer: Medicaid Other

## 2017-12-17 ENCOUNTER — Encounter: Payer: Self-pay | Admitting: Women's Health

## 2017-12-17 ENCOUNTER — Encounter: Payer: Medicaid Other | Admitting: Obstetrics & Gynecology

## 2017-12-17 VITALS — BP 121/83 | HR 86 | Wt 217.0 lb

## 2017-12-17 DIAGNOSIS — Z331 Pregnant state, incidental: Secondary | ICD-10-CM

## 2017-12-17 DIAGNOSIS — O24410 Gestational diabetes mellitus in pregnancy, diet controlled: Secondary | ICD-10-CM

## 2017-12-17 DIAGNOSIS — O24415 Gestational diabetes mellitus in pregnancy, controlled by oral hypoglycemic drugs: Secondary | ICD-10-CM

## 2017-12-17 DIAGNOSIS — O0993 Supervision of high risk pregnancy, unspecified, third trimester: Secondary | ICD-10-CM

## 2017-12-17 DIAGNOSIS — O09523 Supervision of elderly multigravida, third trimester: Secondary | ICD-10-CM

## 2017-12-17 DIAGNOSIS — O24419 Gestational diabetes mellitus in pregnancy, unspecified control: Secondary | ICD-10-CM

## 2017-12-17 DIAGNOSIS — O0990 Supervision of high risk pregnancy, unspecified, unspecified trimester: Secondary | ICD-10-CM

## 2017-12-17 DIAGNOSIS — Z3A34 34 weeks gestation of pregnancy: Secondary | ICD-10-CM

## 2017-12-17 DIAGNOSIS — Z1389 Encounter for screening for other disorder: Secondary | ICD-10-CM

## 2017-12-17 LAB — POCT URINALYSIS DIPSTICK OB
Blood, UA: NEGATIVE
Glucose, UA: NEGATIVE — AB
Leukocytes, UA: NEGATIVE
Nitrite, UA: NEGATIVE
POC,PROTEIN,UA: NEGATIVE

## 2017-12-17 NOTE — Patient Instructions (Signed)
Michelle Page, I greatly value your feedback.  If you receive a survey following your visit with [place] today, we appreciate you taking the time to fill it out.  Thanks, [name], CNM, WHNP-BC   Call the office [phone]) or go to [facility] if:  You begin to have strong, frequent contractions  Your water breaks.  Sometimes it is a big gush of fluid, sometimes it is just a trickle that keeps getting your panties wet or running down your legs  You have vaginal bleeding.  It is normal to have a small amount of spotting if your cervix was checked.   You don't feel your baby moving like normal.  If you don't, get you something to eat and drink and lay down and focus on feeling your baby move.  You should feel at least 10 movements in 2 hours.  If you don't, you should call the office or go to [place] and Birth Information The normal length of a pregnancy is 39-41 weeks. Preterm labor is when labor starts before 37 completed weeks of pregnancy. What are the risk factors for preterm labor? Preterm labor is more likely to occur in women who:  Have certain infections during pregnancy such as a bladder infection, sexually transmitted infection, or infection inside the uterus (chorioamnionitis).  Have a shorter-than-normal cervix.  Have gone into preterm labor before.  Have had surgery on their cervix.  Are younger than age 31 or older than age 90.  Are African American.  Are pregnant with twins or multiple babies (multiple gestation).  Take street drugs or smoke while pregnant.  Do not gain enough weight while pregnant.  Became pregnant shortly after having been pregnant.  What are the symptoms of preterm labor? Symptoms of preterm labor include:  Cramps similar to those that can happen during a menstrual period. The cramps may happen with diarrhea.  Pain in the abdomen or lower back.  Regular uterine contractions that may feel like  tightening of the abdomen.  A feeling of increased pressure in the pelvis.  Increased watery or bloody mucus discharge from the vagina.  Water breaking (ruptured amniotic sac).  Why is it important to recognize signs of preterm labor? It is important to recognize signs of preterm labor because babies who are born prematurely may not be fully developed. This can put them at an increased risk for:  Long-term (chronic) heart and lung problems.  Difficulty immediately after birth with regulating body systems, including blood sugar, body temperature, heart rate, and breathing rate.  Bleeding in the brain.  Cerebral palsy.  Learning difficulties.  Death.  These risks are highest for babies who are born before 22 weeks of pregnancy. How is preterm labor treated? Treatment depends on the length of your pregnancy, your condition, and the health of your baby. It may involve:  Having a stitch (suture) placed in your cervix to prevent your cervix from opening too early (cerclage).  Taking or being given medicines, such as: ? Hormone medicines. These may be given early in pregnancy to help support the pregnancy. ? Medicine to stop contractions. ? Medicines to help mature the baby's lungs. These may be prescribed if the risk of delivery is high. ? Medicines to prevent your baby from developing cerebral palsy.  If the labor happens before 34 weeks of pregnancy, you may need to stay in the hospital. What should I do if I think I am in preterm labor? If  you think that you are going into preterm labor, call your health care provider right away. How can I prevent preterm labor in future pregnancies? To increase your chance of having a full-term pregnancy:  Do not use any tobacco products, such as cigarettes, chewing tobacco, and e-cigarettes. If you need help quitting, ask your health care provider.  Do not use street drugs or medicines that have not been prescribed to you during your  pregnancy.  Talk with your health care provider before taking any herbal supplements, even if you have been taking them regularly.  Make sure you gain a healthy amount of weight during your pregnancy.  Watch for infection. If you think that you might have an infection, get it checked right away.  Make sure to tell your health care provider if you have gone into preterm labor before.  This information is not intended to replace advice given to you by your health care provider. Make sure you discuss any questions you have with your health care provider. Document Released: 07/07/2003 Document Revised: 09/27/2015 Document Reviewed: 09/07/2015 Elsevier Interactive Patient Education  2018 [employer].

## 2017-12-17 NOTE — Progress Notes (Signed)
[place] 34 wks,cephalic,anterior pl gr 2,fhr 137 bpm,afi 16 cm,bilat adnexa's wnl,BPP 8/8

## 2017-12-17 NOTE — Progress Notes (Signed)
   HIGH-RISK PREGNANCY VISIT Patient name: Michelle Page MRN [id number]  Date of birth: Apr 29, 1976 Chief Complaint:   Routine Prenatal Visit  History of Present Illness:   Michelle Page is a 42 y.o. [id number] female at [redacted]w[redacted]d with an Estimated Date of Delivery: 01/28/18 being seen today for ongoing management of a high-risk pregnancy complicated by AMA, L8VF- currently on metformin 500mg  BID, suspected LGA 99%.  Today she reports all FBS <95, only 2 pp >120 (ate chinese one time, other was goulash/garlic bread). 123, 136 Contractions: Not present. Vag. Bleeding: None.  Movement: Present. denies leaking of fluid.  Review of Systems:   Pertinent items are noted in HPI Denies abnormal vaginal discharge w/ itching/odor/irritation, headaches, visual changes, shortness of breath, chest pain, abdominal pain, severe nausea/vomiting, or problems with urination or bowel movements unless otherwise stated above. Pertinent History Reviewed:  Reviewed past medical,surgical, social, obstetrical and family history.  Reviewed problem list, medications and allergies. Physical Assessment:   Vitals:   12/17/17 1454  BP: 121/83  Pulse: 86  Weight: 217 lb (98.4 kg)  Body mass index is 37.23 kg/m.           Physical Examination:   General appearance: alert, well appearing, and in no distress  Mental status: alert, oriented to person, place, and time  Skin: warm & dry   Extremities: Edema: None    Cardiovascular: normal heart rate noted  Respiratory: normal respiratory effort, no distress  Abdomen: gravid, soft, non-tender  Pelvic: Cervical exam deferred         Fetal Status: Fetal Heart Rate (bpm): 137 u/s   Movement: Present    Fetal Surveillance Testing today:  [place] 34 wks,cephalic,anterior pl gr 2,fhr 137 bpm,afi 16 cm,bilat adnexa's wnl,BPP 8/8  Results for orders placed or performed in visit on 12/17/17 (from the past 24 hour(s))  POC Urinalysis Dipstick OB   Collection Time: 12/17/17  2:56  PM  Result Value Ref Range   Color, UA     Clarity, UA     Glucose, UA Negative (A) (none)   Bilirubin, UA     Ketones, UA small    Spec Grav, UA     Blood, UA neg    pH, UA     POC Protein UA Negative Negative, Trace   Urobilinogen, UA     Nitrite, UA neg    Leukocytes, UA Negative Negative   Appearance     Odor      Assessment & Plan:  1) High-risk pregnancy [id number] at [redacted]w[redacted]d with an Estimated Date of Delivery: 01/28/18   2) AMA, continue baby asa  3) A2DM, stable, continue metformin 500mg  BID  4) Suspected LGA> 99%  Meds: No orders of the defined types were placed in this encounter.   Labs/procedures today: u/s  Treatment Plan:  Growth u/s @ 36-38wks    Weekly BPP, then nst alt w/ bpp/dopp @ 36wks (d/t AMA)  Deliver @ 39wks  Reviewed: Preterm labor symptoms and general obstetric precautions including but not limited to vaginal bleeding, contractions, leaking of fluid and fetal movement were reviewed in detail with the patient.  All questions were answered.  Follow-up: Return for As scheduled 8/27, then tues hrob bpp/dopp and fri hrob/nst thereafter (starting 9/3 until 9/20).  Orders Placed This Encounter  Procedures  . US FETAL BPP WO NON STRESS  . POC Urinalysis Dipstick OB   Roma Schanz CNM, [facility] 12/17/2017 4:44 PM

## 2017-12-24 ENCOUNTER — Ambulatory Visit (INDEPENDENT_AMBULATORY_CARE_PROVIDER_SITE_OTHER): Payer: Medicaid Other

## 2017-12-24 ENCOUNTER — Ambulatory Visit (INDEPENDENT_AMBULATORY_CARE_PROVIDER_SITE_OTHER): Payer: Medicaid Other | Admitting: Advanced Practice Midwife

## 2017-12-24 VITALS — BP 121/82 | HR 92 | Wt 216.0 lb

## 2017-12-24 DIAGNOSIS — Z331 Pregnant state, incidental: Secondary | ICD-10-CM

## 2017-12-24 DIAGNOSIS — Z1389 Encounter for screening for other disorder: Secondary | ICD-10-CM

## 2017-12-24 DIAGNOSIS — O24415 Gestational diabetes mellitus in pregnancy, controlled by oral hypoglycemic drugs: Secondary | ICD-10-CM

## 2017-12-24 DIAGNOSIS — O0990 Supervision of high risk pregnancy, unspecified, unspecified trimester: Secondary | ICD-10-CM

## 2017-12-24 DIAGNOSIS — O0993 Supervision of high risk pregnancy, unspecified, third trimester: Secondary | ICD-10-CM | POA: Diagnosis not present

## 2017-12-24 DIAGNOSIS — O09523 Supervision of elderly multigravida, third trimester: Secondary | ICD-10-CM

## 2017-12-24 DIAGNOSIS — Z3A35 35 weeks gestation of pregnancy: Secondary | ICD-10-CM

## 2017-12-24 DIAGNOSIS — O24419 Gestational diabetes mellitus in pregnancy, unspecified control: Secondary | ICD-10-CM

## 2017-12-24 LAB — POCT URINALYSIS DIPSTICK OB
Blood, UA: NEGATIVE
Glucose, UA: NEGATIVE
Ketones, UA: NEGATIVE
Leukocytes, UA: NEGATIVE
Nitrite, UA: NEGATIVE
POC,PROTEIN,UA: NEGATIVE

## 2017-12-24 NOTE — Progress Notes (Signed)
[place] 35 wks,cephalic,BPP 7/3,CAFQEHAZ pl gr 2,afi 17 cm,fhr 132 bpm

## 2017-12-24 NOTE — Progress Notes (Signed)
HIGH-RISK PREGNANCY VISIT Patient name: Michelle Page MRN [id number]  Date of birth: Oct 04, 1975 Chief Complaint:   Routine Prenatal Visit ([place] today)  History of Present Illness:   Michelle Page is a 42 y.o. [id number] female at 26w0dwith an Estimated Date of Delivery: 01/28/18 being seen today for ongoing management of a high-risk pregnancy complicated by  AMA, AZ3GD currently on metformin 5020mBID, suspected LGA 99%. .  Today she reports FBS: <92 and 2hr PP all normal excetp for 2 in the 140s' (gave in to chocolate). Has had some AM ha's, go away on their own in a few hours. Contractions: Not present. Vag. Bleeding: None.  Movement: Present. denies leaking of fluid.  Review of Systems:   Pertinent items are noted in HPI Denies abnormal vaginal discharge w/ itching/odor/irritation, visual changes, shortness of breath, chest pain, abdominal pain, severe nausea/vomiting, or problems with urination or bowel movements unless otherwise stated above.    Pertinent History Reviewed:  Medical & Surgical Hx:   Past Medical History:  Diagnosis Date  . Abnormal Pap smear    colpo/leep  . Anemia   . Anxiety   [facility]   [facility]  . Chlamydia infection   . Depression   . Fractures   . H/O candidiasis   . H/O varicella   . Headache(784.0)    migraines as a child   . Heart murmur   . High risk HPV infection   . Papanicolaou smear of cervix with positive high risk human papilloma virus (HPV) test 06/03/2017  . Pregnancy induced hypertension    1st & 2nd pregnancy  . Pregnancy induced hypertension    1st & 2nd pregnancy   . Two vessel umbilical cord, antepartum 11/26/2011  . Vaginal Pap smear, abnormal   . Yeast infection    Past Surgical History:  Procedure Laterality Date  . APPENDECTOMY  08/14/2017  . COLPOSCOPY    . LAPAROSCOPIC APPENDECTOMY N/A 08/14/2017   Procedure: APPENDECTOMY LAPAROSCOPIC;  Surgeon: CoErroll LunaMD;  [facility];  Laterality:  N/A;  . LEEP    . WISDOM TOOTH EXTRACTION     Family History  Problem Relation Age of Onset  . Alcohol abuse Father   . Emphysema Father   . COPD Father   . Mental illness Father        PTSD  . Alcohol abuse Brother   . Stroke Brother   . COPD Paternal Grandmother   . Cancer Paternal Grandmother        skin cancer  . Heart disease Paternal Grandfather        enlarged heart  . Multiple sclerosis Sister   . Depression Mother   . Asthma Son     Current Outpatient Medications:  .  ACCU-CHEK FASTCLIX LANCETS MISC, USE 1 TO CHECK GLUCOSE 4 TIMES DAILY, Disp: , Rfl: 0 .  ACCU-CHEK GUIDE test strip, USE 1 STRIP TO CHECK GLUCOSE 4 TIMES DAILY, Disp: , Rfl: 0 .  Acetaminophen (TYLENOL PO), Take by mouth as needed., Disp: , Rfl:  .  aspirin EC 81 MG tablet, Take 81 mg by mouth daily., Disp: , Rfl:  .  Blood Glucose Monitoring Suppl (ACCU-CHEK GUIDE) w/Device KIT, USE TO CHECK BLOOD GLUCOSE, Disp: , Rfl: 0 .  diphenhydrAMINE (BENADRYL) 25 MG tablet, Take 25 mg by mouth every 6 (six) hours as needed for allergies., Disp: , Rfl:  .  metFORMIN (GLUCOPHAGE) 500 MG tablet, Take 1 tablet (500  mg total) by mouth 2 (two) times daily with a meal., Disp: 60 tablet, Rfl: 3 .  prenatal vitamin w/FE, FA (PRENATAL 1 + 1) 27-1 MG TABS tablet, Take 1 tablet by mouth daily at 12 noon., Disp: 30 each, Rfl: 12 .  omeprazole (PRILOSEC) 20 MG capsule, Take 1 capsule (20 mg total) by mouth daily. (Patient not taking: Reported on 12/24/2017), Disp: 30 capsule, Rfl: 6 Social History: Reviewed -  reports that she has quit smoking. Her smoking use included cigarettes. She has never used smokeless tobacco.   Physical Assessment:   Vitals:   12/24/17 1505  BP: 121/82  Pulse: 92  Weight: 216 lb (98 kg)  Body mass index is 37.06 kg/m.           Physical Examination:   General appearance: alert, well appearing, and in no distress  Mental status: alert, oriented to person, place, and time  Skin: warm & dry    Extremities: Edema: None    Cardiovascular: normal heart rate noted  Respiratory: normal respiratory effort, no distress  Abdomen: gravid, soft, non-tender  Pelvic: Cervical exam deferred         Fetal Status:     Movement: Present    Fetal Surveillance Testing today: [place] 35 wks,cephalic,BPP 7/5,QGBEEFEO pl gr 2,afi 17 cm,fhr 132 bpm  Results for orders placed or performed in visit on 12/24/17 (from the past 24 hour(s))  POC Urinalysis Dipstick OB   Collection Time: 12/24/17  3:08 PM  Result Value Ref Range   Color, UA     Clarity, UA     Glucose, UA Negative Negative   Bilirubin, UA     Ketones, UA neg    Spec Grav, UA     Blood, UA neg    pH, UA     POC Protein UA Negative Negative, Trace   Urobilinogen, UA     Nitrite, UA neg    Leukocytes, UA Negative Negative   Appearance     Odor      Assessment & Plan:  1) High-risk pregnancy [id number] at 57w0dwith an Estimated Date of Delivery: 01/28/18   2) A2DM, stablestableSuspected LGA >99% at 31 weeks  Will check EFW 37weeks so POC can be developed.    Treatment Plan:  QID BS testing; EFW 37 weeks, Weekly BPP  (NST not necessary, confirmed w/JVF, since pt is getting BPPs)  IOL 39 weeks    Follow-up: Return for As scheduled (change 9/10 appt to be w/a MD)  cancle all appts that aren't [place] EFW on 9/10  Orders Placed This Encounter  Procedures  . [place] Follow Up  . POC Urinalysis Dipstick OB   FChristin FudgeCNM 12/24/2017 3:41 PM

## 2017-12-24 NOTE — Patient Instructions (Signed)

## 2017-12-31 ENCOUNTER — Other Ambulatory Visit: Payer: Medicaid Other | Admitting: Obstetrics & Gynecology

## 2018-01-03 ENCOUNTER — Ambulatory Visit (INDEPENDENT_AMBULATORY_CARE_PROVIDER_SITE_OTHER): Payer: Medicaid Other | Admitting: Obstetrics & Gynecology

## 2018-01-03 ENCOUNTER — Other Ambulatory Visit: Payer: Self-pay | Admitting: Advanced Practice Midwife

## 2018-01-03 ENCOUNTER — Ambulatory Visit (INDEPENDENT_AMBULATORY_CARE_PROVIDER_SITE_OTHER): Payer: Medicaid Other

## 2018-01-03 ENCOUNTER — Encounter: Payer: Self-pay | Admitting: Obstetrics & Gynecology

## 2018-01-03 VITALS — BP 111/68 | HR 76 | Wt 215.0 lb

## 2018-01-03 DIAGNOSIS — O0990 Supervision of high risk pregnancy, unspecified, unspecified trimester: Secondary | ICD-10-CM

## 2018-01-03 DIAGNOSIS — O24419 Gestational diabetes mellitus in pregnancy, unspecified control: Secondary | ICD-10-CM

## 2018-01-03 DIAGNOSIS — O0993 Supervision of high risk pregnancy, unspecified, third trimester: Secondary | ICD-10-CM

## 2018-01-03 DIAGNOSIS — O09522 Supervision of elderly multigravida, second trimester: Secondary | ICD-10-CM

## 2018-01-03 DIAGNOSIS — O0992 Supervision of high risk pregnancy, unspecified, second trimester: Secondary | ICD-10-CM

## 2018-01-03 DIAGNOSIS — Z3A36 36 weeks gestation of pregnancy: Secondary | ICD-10-CM

## 2018-01-03 DIAGNOSIS — Z1389 Encounter for screening for other disorder: Secondary | ICD-10-CM

## 2018-01-03 DIAGNOSIS — Z331 Pregnant state, incidental: Secondary | ICD-10-CM

## 2018-01-03 LAB — POCT URINALYSIS DIPSTICK OB
Glucose, UA: NEGATIVE
Leukocytes, UA: NEGATIVE
Nitrite, UA: NEGATIVE
POC,PROTEIN,UA: NEGATIVE

## 2018-01-03 NOTE — Progress Notes (Signed)
[place] 74+9 wks,cephalic,fhr 449 bpm,anterior pl gr 3,afi 11 cm,bilat adnexa's wnl,BPP 8/8

## 2018-01-03 NOTE — Progress Notes (Signed)
   HIGH-RISK PREGNANCY VISIT Patient name: Michelle Page MRN [id number]  Date of birth: [birth year] Chief Complaint:   High Risk Gestation ([place] today, GC/CHL)  History of Present Illness:   Michelle Page is a 42 y.o. [id number] female at [redacted]w[redacted]d with an Estimated Date of Delivery: 01/28/18 being seen today for ongoing management of a high-risk pregnancy complicated by class  A2 DM.  Today she reports no complaints. Contractions: Irregular. Vag. Bleeding: None.  Movement: Present. denies leaking of fluid.  Review of Systems:   Pertinent items are noted in HPI Denies abnormal vaginal discharge w/ itching/odor/irritation, headaches, visual changes, shortness of breath, chest pain, abdominal pain, severe nausea/vomiting, or problems with urination or bowel movements unless otherwise stated above. Pertinent History Reviewed:  Reviewed past medical,surgical, social, obstetrical and family history.  Reviewed problem list, medications and allergies. Physical Assessment:   Vitals:   01/03/18 1327  BP: 111/68  Pulse: 76  Weight: 215 lb (97.5 kg)  Body mass index is 36.89 kg/m.           Physical Examination:   General appearance: alert, well appearing, and in no distress  Mental status: alert, oriented to person, place, and time  Skin: warm & dry   Extremities: Edema: None    Cardiovascular: normal heart rate noted  Respiratory: normal respiratory effort, no distress  Abdomen: gravid, soft, non-tender  Pelvic: Cervical exam deferred         Fetal Status:     Movement: Present    Fetal Surveillance Testing today: BPP 8/8   Results for orders placed or performed in visit on 01/03/18 (from the past 24 hour(s))  POC Urinalysis Dipstick OB   Collection Time: 01/03/18  1:28 PM  Result Value Ref Range   Color, UA     Clarity, UA     Glucose, UA Negative Negative   Bilirubin, UA     Ketones, UA trace    Spec Grav, UA     Blood, UA trace    pH, UA     POC Protein UA Negative Negative,  Trace   Urobilinogen, UA     Nitrite, UA neg    Leukocytes, UA Negative Negative   Appearance     Odor      Assessment & Plan:  1) High-risk pregnancy [id number] at [redacted]w[redacted]d with an Estimated Date of Delivery: 01/28/18   2) Class A2 DM, stable  3) Suspected Fetal macrosomia, stable, EFW 4 days to evaluate current growth percentile  Meds: No orders of the defined types were placed in this encounter.   Labs/procedures today: BPP 8/8  Treatment Plan:  Check EFW 4 days   Reviewed: Term labor symptoms and general obstetric precautions including but not limited to vaginal bleeding, contractions, leaking of fluid and fetal movement were reviewed in detail with the patient.  All questions were answered.  Follow-up: Return for keep scheduled.  Orders Placed This Encounter  Procedures  . GC/Chlamydia Probe Amp  . POC Urinalysis Dipstick OB   Mertie Clause Eure  01/03/2018 1:58 PM

## 2018-01-04 LAB — GC/CHLAMYDIA PROBE AMP
Chlamydia trachomatis, NAA: NEGATIVE
Neisseria gonorrhoeae by PCR: NEGATIVE

## 2018-01-07 ENCOUNTER — Encounter: Payer: Medicaid Other | Admitting: Advanced Practice Midwife

## 2018-01-07 ENCOUNTER — Ambulatory Visit (INDEPENDENT_AMBULATORY_CARE_PROVIDER_SITE_OTHER): Payer: Medicaid Other

## 2018-01-07 ENCOUNTER — Ambulatory Visit (INDEPENDENT_AMBULATORY_CARE_PROVIDER_SITE_OTHER): Payer: Medicaid Other | Admitting: Obstetrics & Gynecology

## 2018-01-07 VITALS — BP 132/74 | HR 80 | Wt 216.0 lb

## 2018-01-07 DIAGNOSIS — Z3A37 37 weeks gestation of pregnancy: Secondary | ICD-10-CM

## 2018-01-07 DIAGNOSIS — O09522 Supervision of elderly multigravida, second trimester: Secondary | ICD-10-CM

## 2018-01-07 DIAGNOSIS — O0990 Supervision of high risk pregnancy, unspecified, unspecified trimester: Secondary | ICD-10-CM

## 2018-01-07 DIAGNOSIS — O0992 Supervision of high risk pregnancy, unspecified, second trimester: Secondary | ICD-10-CM | POA: Diagnosis not present

## 2018-01-07 DIAGNOSIS — O3660X0 Maternal care for excessive fetal growth, unspecified trimester, not applicable or unspecified: Secondary | ICD-10-CM

## 2018-01-07 DIAGNOSIS — O24419 Gestational diabetes mellitus in pregnancy, unspecified control: Secondary | ICD-10-CM

## 2018-01-07 DIAGNOSIS — O0993 Supervision of high risk pregnancy, unspecified, third trimester: Secondary | ICD-10-CM

## 2018-01-07 NOTE — Progress Notes (Signed)
   HIGH-RISK PREGNANCY VISIT Patient name: Michelle Page MRN [id number]  Date of birth: 05-Dec-1975 Chief Complaint:   Routine Prenatal Visit ([place] today)  History of Present Illness:   Michelle Page is a 42 y.o. [id number] female at [redacted]w[redacted]d with an Estimated Date of Delivery: 01/28/18 being seen today for ongoing management of a high-risk pregnancy complicated by class  A2 DM.  Today she reports backache. Contractions: Not present. Vag. Bleeding: None.  Movement: Present. denies leaking of fluid.  Review of Systems:   Pertinent items are noted in HPI Denies abnormal vaginal discharge w/ itching/odor/irritation, headaches, visual changes, shortness of breath, chest pain, abdominal pain, severe nausea/vomiting, or problems with urination or bowel movements unless otherwise stated above. Pertinent History Reviewed:  Reviewed past medical,surgical, social, obstetrical and family history.  Reviewed problem list, medications and allergies. Physical Assessment:   Vitals:   01/07/18 1546  BP: 132/74  Pulse: 80  Weight: 216 lb (98 kg)  Body mass index is 37.06 kg/m.           Physical Examination:   General appearance: alert, well appearing, and in no distress  Mental status: alert, oriented to person, place, and time  Skin: warm & dry   Extremities: Edema: None    Cardiovascular: normal heart rate noted  Respiratory: normal respiratory effort, no distress  Abdomen: gravid, soft, non-tender  Pelvic: Cervical exam deferred         Fetal Status:     Movement: Present    Fetal Surveillance Testing today: BPP 8/8   No results found for this or any previous visit (from the past 24 hour(s)).  Assessment & Plan:  1) High-risk pregnancy [id number] at [redacted]w[redacted]d with an Estimated Date of Delivery: 01/28/18   2) Class A2 DM, stable  3) Suspected Fetal macrosomia, we had long discussion over management, will decide route of delivery next week  Meds: No orders of the defined types were placed in this  encounter.   Labs/procedures today: sonogram  Treatment Plan:  Deliver 39 weeks route will be determined next week, long discussion with all questions anw=swered  Reviewed: Term labor symptoms and general obstetric precautions including but not limited to vaginal bleeding, contractions, leaking of fluid and fetal movement were reviewed in detail with the patient.  All questions were answered.  Follow-up: Return in about 1 week (around 01/14/2018) for BPP/sono, HROB, with Dr Elonda Husky.  No orders of the defined types were placed in this encounter.  Florian Buff  01/07/2018 5:08 PM

## 2018-01-07 NOTE — Progress Notes (Signed)
[place] 37 wks,cephalic,fhr 583 bpm,anterior pl gr 3,BPP 8/8,AFI 10 cm,normal ovaries bilat,EFW 3812 g 97%

## 2018-01-10 ENCOUNTER — Other Ambulatory Visit: Payer: Medicaid Other | Admitting: Obstetrics and Gynecology

## 2018-01-14 ENCOUNTER — Other Ambulatory Visit: Payer: Medicaid Other

## 2018-01-15 ENCOUNTER — Other Ambulatory Visit: Payer: Self-pay | Admitting: Obstetrics & Gynecology

## 2018-01-15 ENCOUNTER — Other Ambulatory Visit: Payer: Medicaid Other

## 2018-01-15 DIAGNOSIS — O3663X0 Maternal care for excessive fetal growth, third trimester, not applicable or unspecified: Secondary | ICD-10-CM

## 2018-01-15 DIAGNOSIS — O09523 Supervision of elderly multigravida, third trimester: Secondary | ICD-10-CM

## 2018-01-16 ENCOUNTER — Ambulatory Visit (INDEPENDENT_AMBULATORY_CARE_PROVIDER_SITE_OTHER): Payer: Medicaid Other

## 2018-01-16 ENCOUNTER — Ambulatory Visit (INDEPENDENT_AMBULATORY_CARE_PROVIDER_SITE_OTHER): Payer: Medicaid Other | Admitting: Women's Health

## 2018-01-16 VITALS — BP 122/85 | HR 82 | Wt 214.6 lb

## 2018-01-16 DIAGNOSIS — O09523 Supervision of elderly multigravida, third trimester: Secondary | ICD-10-CM | POA: Diagnosis not present

## 2018-01-16 DIAGNOSIS — O3663X0 Maternal care for excessive fetal growth, third trimester, not applicable or unspecified: Secondary | ICD-10-CM | POA: Diagnosis not present

## 2018-01-16 DIAGNOSIS — Z331 Pregnant state, incidental: Secondary | ICD-10-CM

## 2018-01-16 DIAGNOSIS — O0993 Supervision of high risk pregnancy, unspecified, third trimester: Secondary | ICD-10-CM

## 2018-01-16 DIAGNOSIS — O0990 Supervision of high risk pregnancy, unspecified, unspecified trimester: Secondary | ICD-10-CM

## 2018-01-16 DIAGNOSIS — O24419 Gestational diabetes mellitus in pregnancy, unspecified control: Secondary | ICD-10-CM | POA: Diagnosis not present

## 2018-01-16 DIAGNOSIS — Z3A38 38 weeks gestation of pregnancy: Secondary | ICD-10-CM | POA: Diagnosis not present

## 2018-01-16 DIAGNOSIS — Z1389 Encounter for screening for other disorder: Secondary | ICD-10-CM

## 2018-01-16 DIAGNOSIS — O3660X0 Maternal care for excessive fetal growth, unspecified trimester, not applicable or unspecified: Secondary | ICD-10-CM

## 2018-01-16 LAB — POCT URINALYSIS DIPSTICK OB
Blood, UA: NEGATIVE
Glucose, UA: NEGATIVE
Ketones, UA: NEGATIVE
Leukocytes, UA: NEGATIVE
Nitrite, UA: NEGATIVE
POC,PROTEIN,UA: NEGATIVE

## 2018-01-16 NOTE — Progress Notes (Signed)
[place] 00+3 wks,cephalic,BPP 7/9,KCC 619 BPM,anterior pl gr 3,bilat adnexa's wnl

## 2018-01-16 NOTE — Progress Notes (Signed)
   HIGH-RISK PREGNANCY VISIT Patient name: Michelle Page MRN [id number]  Date of birth: February 03, 1976 Chief Complaint:   Routine Prenatal Visit ([place] today)  History of Present Illness:   Michelle Page is a 42 y.o. [id number] female at [redacted]w[redacted]d with an Estimated Date of Delivery: 01/28/18 being seen today for ongoing management of a high-risk pregnancy complicated by K3TW on metformin, suspected LGA 99%, AMA, grand multipara.  Today she reports fbs 85-91, 2hr pp 82-133 (only 5 >120). Contractions: Not present. Vag. Bleeding: None.  Movement: Present. denies leaking of fluid.  Review of Systems:   Pertinent items are noted in HPI Denies abnormal vaginal discharge w/ itching/odor/irritation, headaches, visual changes, shortness of breath, chest pain, abdominal pain, severe nausea/vomiting, or problems with urination or bowel movements unless otherwise stated above. Pertinent History Reviewed:  Reviewed past medical,surgical, social, obstetrical and family history.  Reviewed problem list, medications and allergies. Physical Assessment:   Vitals:   01/16/18 1407  BP: 122/85  Pulse: 82  Weight: 214 lb 9.6 oz (97.3 kg)  Body mass index is 36.82 kg/m.           Physical Examination:   General appearance: alert, well appearing, and in no distress  Mental status: alert, oriented to person, place, and time  Skin: warm & dry   Extremities: Edema: None    Cardiovascular: normal heart rate noted  Respiratory: normal respiratory effort, no distress  Abdomen: gravid, soft, non-tender  Pelvic: Cervical exam deferred         Fetal Status:     Movement: Present    Fetal Surveillance Testing today: [place] 65+6 wks,cephalic,BPP 8/1,EXN 170 BPM,anterior pl gr 3,bilat adnexa's wnl,afi 14 cm  Results for orders placed or performed in visit on 01/16/18 (from the past 24 hour(s))  POC Urinalysis Dipstick OB   Collection Time: 01/16/18  2:04 PM  Result Value Ref Range   Color, UA     Clarity, UA     Glucose, UA Negative Negative   Bilirubin, UA     Ketones, UA neg    Spec Grav, UA     Blood, UA neg    pH, UA     POC Protein UA Negative Negative, Trace   Urobilinogen, UA     Nitrite, UA neg    Leukocytes, UA Negative Negative   Appearance     Odor      Assessment & Plan:  1) High-risk pregnancy [id number] at [redacted]w[redacted]d with an Estimated Date of Delivery: 01/28/18   2) A2DM, stable, continue metformin 500mg  BID  3) AMA, stable  4) Suspected LGA> EFW 97% @ 37wks  5) H/O GHTN  Meds: No orders of the defined types were placed in this encounter.   Labs/procedures today: u/s  Treatment Plan:  IOL 9/24 @ 9675,  IOL form faxed via Epic and orders placed   Reviewed: Term labor symptoms and general obstetric precautions including but not limited to vaginal bleeding, contractions, leaking of fluid and fetal movement were reviewed in detail with the patient.  All questions were answered.  Follow-up: none  Orders Placed This Encounter  Procedures  . POC Urinalysis Dipstick OB   Roma Schanz CNM, [facility] 01/16/2018 3:00 PM

## 2018-01-17 ENCOUNTER — Encounter: Payer: Self-pay | Admitting: Women's Health

## 2018-01-17 ENCOUNTER — Other Ambulatory Visit: Payer: Medicaid Other | Admitting: Women's Health

## 2018-01-17 NOTE — Patient Instructions (Signed)
Michelle Page, I greatly value your feedback.  If you receive a survey following your visit with [place] today, we appreciate you taking the time to fill it out.  Thanks, [name], CNM, WHNP-BC   Call the office [phone]) or go to [facility] if:  You begin to have strong, frequent contractions  Your water breaks.  Sometimes it is a big gush of fluid, sometimes it is just a trickle that keeps getting your panties wet or running down your legs  You have vaginal bleeding.  It is normal to have a small amount of spotting if your cervix was checked.   You don't feel your baby moving like normal.  If you don't, get you something to eat and drink and lay down and focus on feeling your baby move.  You should feel at least 10 movements in 2 hours.  If you don't, you should call the office or go to [place] Contractions Contractions of the uterus can occur throughout pregnancy, but they are not always a sign that you are in labor. You may have practice contractions called Braxton Hicks contractions. These false labor contractions are sometimes confused with true labor. What are [place] contractions? Braxton Hicks contractions are tightening movements that occur in the muscles of the uterus before labor. Unlike true labor contractions, these contractions do not result in opening (dilation) and thinning of the cervix. Toward the end of pregnancy (32-34 weeks), Braxton Hicks contractions can happen more often and may become stronger. These contractions are sometimes difficult to tell apart from true labor because they can be very uncomfortable. You should not feel embarrassed if you go to the hospital with false labor. Sometimes, the only way to tell if you are in true labor is for your health care provider to look for changes in the cervix. The health care provider will do a physical exam and may monitor your contractions. If you are not in true labor, the exam should  show that your cervix is not dilating and your water has not broken. If there are other health problems associated with your pregnancy, it is completely safe for you to be sent home with false labor. You may continue to have Braxton Hicks contractions until you go into true labor. How to tell the difference between true labor and false labor True labor  Contractions last 30-70 seconds.  Contractions become very regular.  Discomfort is usually felt in the top of the uterus, and it spreads to the lower abdomen and low back.  Contractions do not go away with walking.  Contractions usually become more intense and increase in frequency.  The cervix dilates and gets thinner. False labor  Contractions are usually shorter and not as strong as true labor contractions.  Contractions are usually irregular.  Contractions are often felt in the front of the lower abdomen and in the groin.  Contractions may go away when you walk around or change positions while lying down.  Contractions get weaker and are shorter-lasting as time goes on.  The cervix usually does not dilate or become thin. Follow these instructions at home:  Take over-the-counter and prescription medicines only as told by your health care provider.  Keep up with your usual exercises and follow other instructions from your health care provider.  Eat and drink lightly if you think you are going into labor.  If Braxton Hicks contractions are making you uncomfortable: ? Change your position from lying  down or resting to walking, or change from walking to resting. ? Sit and rest in a tub of warm water. ? Drink enough fluid to keep your urine pale yellow. Dehydration may cause these contractions. ? Do slow and deep breathing several times an hour.  Keep all follow-up prenatal visits as told by your health care provider. This is important. Contact a health care provider if:  You have a fever.  You have continuous pain in  your abdomen. Get help right away if:  Your contractions become stronger, more regular, and closer together.  You have fluid leaking or gushing from your vagina.  You pass blood-tinged mucus (bloody show).  You have bleeding from your vagina.  You have low back pain that you never had before.  You feel your baby's head pushing down and causing pelvic pressure.  Your baby is not moving inside you as much as it used to. Summary  Contractions that occur before labor are called Braxton Hicks contractions, false labor, or practice contractions.  Braxton Hicks contractions are usually shorter, weaker, farther apart, and less regular than true labor contractions. True labor contractions usually become progressively stronger and regular and they become more frequent.  Manage discomfort from [facility] contractions by changing position, resting in a warm bath, drinking plenty of water, or practicing deep breathing. This information is not intended to replace advice given to you by your health care provider. Make sure you discuss any questions you have with your health care provider. Document Released: 08/30/2016 Document Revised: 08/30/2016 Document Reviewed: 08/30/2016 Elsevier Interactive Patient Education  2018 [employer].

## 2018-01-17 NOTE — Treatment Plan (Signed)
Induction Assessment Scheduling Form Fax to [facility]:  [phone]  Michelle Page                                                                                   DOB:  [birth year]                                                            MRN:  [id number]                                                                     Phone #:   [phone]                         Provider:  [facility]  GP:  [id number]                                                            Estimated Date of Delivery: 01/28/18  Dating Criteria: LMP c/w 7wk u/s    Medical Indications for induction:  A2DM, suspected LGA, AMA Admission Date/Time:  9/25 @ [facility] Gestational age on admission:  11.1   Filed Weights   01/16/18 1407  Weight: 214 lb 9.6 oz (97.3 kg)   HIV:  Non Reactive (07/03 0859) GBS:  Pos  SVE: TBD   Method of induction(proposed):  TBD, will place cytotec orders since we haven't checked cx   Scheduling Provider Signature:  Roma Schanz, CNM                                            Today's Date:  01/17/2018

## 2018-01-20 ENCOUNTER — Telehealth (HOSPITAL_COMMUNITY): Payer: Self-pay | Admitting: *Deleted

## 2018-01-20 ENCOUNTER — Encounter (HOSPITAL_COMMUNITY): Payer: Self-pay | Admitting: *Deleted

## 2018-01-20 ENCOUNTER — Encounter: Payer: Medicaid Other | Admitting: Obstetrics & Gynecology

## 2018-01-20 NOTE — Telephone Encounter (Signed)
Preadmission screen  

## 2018-01-21 ENCOUNTER — Encounter: Payer: Medicaid Other | Admitting: Obstetrics & Gynecology

## 2018-01-21 ENCOUNTER — Other Ambulatory Visit: Payer: Medicaid Other

## 2018-01-22 ENCOUNTER — Inpatient Hospital Stay (HOSPITAL_COMMUNITY): Payer: Medicaid Other | Admitting: Anesthesiology

## 2018-01-22 ENCOUNTER — Encounter (HOSPITAL_COMMUNITY): Payer: Self-pay

## 2018-01-22 ENCOUNTER — Encounter (HOSPITAL_COMMUNITY)
Admission: RE | Disposition: A | Discharge status: Home / Self Care | Payer: Self-pay | Source: Home / Self Care | Attending: Obstetrics and Gynecology

## 2018-01-22 ENCOUNTER — Inpatient Hospital Stay (HOSPITAL_COMMUNITY)
Admission: RE | Admit: 2018-01-22 | Discharge: 2018-01-24 | DRG: 797 | Disposition: A | Discharge status: Home / Self Care | Payer: Medicaid Other | Attending: Obstetrics and Gynecology | Admitting: Obstetrics and Gynecology

## 2018-01-22 ENCOUNTER — Telehealth: Payer: Self-pay | Admitting: *Deleted

## 2018-01-22 VITALS — BP 116/87 | HR 71 | Temp 97.6°F | Resp 18 | Ht 64.0 in | Wt 214.4 lb

## 2018-01-22 DIAGNOSIS — Z87891 Personal history of nicotine dependence: Secondary | ICD-10-CM | POA: Diagnosis not present

## 2018-01-22 DIAGNOSIS — O9912 Other diseases of the blood and blood-forming organs and certain disorders involving the immune mechanism complicating childbirth: Secondary | ICD-10-CM | POA: Diagnosis present

## 2018-01-22 DIAGNOSIS — O2412 Pre-existing diabetes mellitus, type 2, in childbirth: Secondary | ICD-10-CM

## 2018-01-22 DIAGNOSIS — D6959 Other secondary thrombocytopenia: Secondary | ICD-10-CM | POA: Diagnosis present

## 2018-01-22 DIAGNOSIS — O3663X0 Maternal care for excessive fetal growth, third trimester, not applicable or unspecified: Secondary | ICD-10-CM | POA: Diagnosis present

## 2018-01-22 DIAGNOSIS — Z3A39 39 weeks gestation of pregnancy: Secondary | ICD-10-CM

## 2018-01-22 DIAGNOSIS — Z302 Encounter for sterilization: Secondary | ICD-10-CM | POA: Diagnosis not present

## 2018-01-22 DIAGNOSIS — O99824 Streptococcus B carrier state complicating childbirth: Secondary | ICD-10-CM | POA: Diagnosis present

## 2018-01-22 DIAGNOSIS — O24425 Gestational diabetes mellitus in childbirth, controlled by oral hypoglycemic drugs: Secondary | ICD-10-CM | POA: Diagnosis present

## 2018-01-22 DIAGNOSIS — O0990 Supervision of high risk pregnancy, unspecified, unspecified trimester: Secondary | ICD-10-CM

## 2018-01-22 DIAGNOSIS — Z7982 Long term (current) use of aspirin: Secondary | ICD-10-CM

## 2018-01-22 HISTORY — PX: TUBAL LIGATION: SHX77

## 2018-01-22 LAB — GLUCOSE, CAPILLARY
Glucose-Capillary: 70 mg/dL (ref 70–99)
Glucose-Capillary: 88 mg/dL (ref 70–99)
Glucose-Capillary: 87 mg/dL (ref 70–99)
Glucose-Capillary: 85 mg/dL (ref 70–99)
Glucose-Capillary: 76 mg/dL (ref 70–99)

## 2018-01-22 LAB — CBC
HCT: 35.4 % — ABNORMAL LOW (ref 36.0–46.0)
Hemoglobin: 12.1 g/dL (ref 12.0–15.0)
MCH: 30.6 pg (ref 26.0–34.0)
MCHC: 34.2 g/dL (ref 30.0–36.0)
MCV: 89.6 fL (ref 78.0–100.0)
Platelets: 132 10*3/uL — ABNORMAL LOW (ref 150–400)
RBC: 3.95 MIL/uL (ref 3.87–5.11)
RDW: 14 % (ref 11.5–15.5)
WBC: 6.3 10*3/uL (ref 4.0–10.5)

## 2018-01-22 LAB — TYPE AND SCREEN
ABO/RH(D): A POS
Antibody Screen: NEGATIVE

## 2018-01-22 LAB — RPR: RPR Ser Ql: NONREACTIVE

## 2018-01-22 SURGERY — LIGATION, FALLOPIAN TUBE, POSTPARTUM
Anesthesia: Epidural

## 2018-01-22 MED ORDER — PROPOFOL 10 MG/ML IV BOLUS
INTRAVENOUS | Status: AC
  Filled 2018-01-22: qty 20

## 2018-01-22 MED ORDER — PRENATAL MULTIVITAMIN CH: 1.0000 | ORAL_TABLET | Freq: Every day | ORAL | Status: DC

## 2018-01-22 MED ORDER — OXYCODONE-ACETAMINOPHEN 5-325 MG PO TABS
1.0000 | ORAL_TABLET | ORAL | Status: DC | PRN
  Administered 2018-01-23 – 2018-01-24 (×2): 1 via ORAL
  Filled 2018-01-22 (×3): qty 1

## 2018-01-22 MED ORDER — PHENYLEPHRINE 40 MCG/ML (10ML) SYRINGE FOR IV PUSH (FOR BLOOD PRESSURE SUPPORT)
PREFILLED_SYRINGE | INTRAVENOUS | Status: AC
  Filled 2018-01-22: qty 20

## 2018-01-22 MED ORDER — OXYTOCIN 40 UNITS IN LACTATED RINGERS INFUSION - SIMPLE MED
INTRAVENOUS | Status: AC
  Filled 2018-01-22: qty 1000

## 2018-01-22 MED ORDER — BUPIVACAINE HCL (PF) 0.25 % IJ SOLN
INTRAMUSCULAR | Status: AC
  Filled 2018-01-22: qty 30

## 2018-01-22 MED ORDER — EPHEDRINE 5 MG/ML INJ: 10.0000 mg | INTRAVENOUS | Status: DC | PRN

## 2018-01-22 MED ORDER — BUPIVACAINE HCL (PF) 0.25 % IJ SOLN
INTRAMUSCULAR | Status: DC | PRN
  Administered 2018-01-22: 10 mL

## 2018-01-22 MED ORDER — ZOLPIDEM TARTRATE 5 MG PO TABS: 5.0000 mg | ORAL_TABLET | Freq: Every evening | ORAL | Status: DC | PRN

## 2018-01-22 MED ORDER — SOD CITRATE-CITRIC ACID 500-334 MG/5ML PO SOLN
30.0000 mL | ORAL | Status: DC | PRN
  Administered 2018-01-22: 30 mL via ORAL
  Filled 2018-01-22: qty 15

## 2018-01-22 MED ORDER — IBUPROFEN 600 MG PO TABS: 600.0000 mg | ORAL_TABLET | Freq: Four times a day (QID) | ORAL | Status: DC

## 2018-01-22 MED ORDER — ONDANSETRON HCL 4 MG/2ML IJ SOLN: 4.0000 mg | INTRAMUSCULAR | Status: DC | PRN

## 2018-01-22 MED ORDER — LACTATED RINGERS IV SOLN
INTRAVENOUS | Status: DC | PRN
  Administered 2018-01-22: 11:00:00 via INTRAVENOUS

## 2018-01-22 MED ORDER — LACTATED RINGERS IV SOLN: 500.0000 mL | Freq: Once | INTRAVENOUS | Status: DC

## 2018-01-22 MED ORDER — FENTANYL 2.5 MCG/ML BUPIVACAINE 1/10 % EPIDURAL INFUSION (WH - ANES)
14.0000 mL/h | INTRAMUSCULAR | Status: DC | PRN
  Administered 2018-01-22: 14 mL/h via EPIDURAL

## 2018-01-22 MED ORDER — LIDOCAINE-EPINEPHRINE (PF) 2 %-1:200000 IJ SOLN
INTRAMUSCULAR | Status: AC
  Filled 2018-01-22: qty 20

## 2018-01-22 MED ORDER — ONDANSETRON HCL 4 MG PO TABS: 4.0000 mg | ORAL_TABLET | ORAL | Status: DC | PRN

## 2018-01-22 MED ORDER — ONDANSETRON HCL 4 MG/2ML IJ SOLN
INTRAMUSCULAR | Status: DC | PRN
  Administered 2018-01-22: 4 mg via INTRAVENOUS

## 2018-01-22 MED ORDER — ONDANSETRON HCL 4 MG/2ML IJ SOLN
4.0000 mg | Freq: Four times a day (QID) | INTRAMUSCULAR | Status: DC | PRN
  Administered 2018-01-22: 4 mg via INTRAVENOUS
  Filled 2018-01-22: qty 2

## 2018-01-22 MED ORDER — MIDAZOLAM HCL 2 MG/2ML IJ SOLN
INTRAMUSCULAR | Status: DC | PRN
  Administered 2018-01-22 (×2): 1 mg via INTRAVENOUS

## 2018-01-22 MED ORDER — SODIUM BICARBONATE 8.4 % IV SOLN
INTRAVENOUS | Status: DC | PRN
  Administered 2018-01-22: 3 mL via EPIDURAL
  Administered 2018-01-22: 7 mL via EPIDURAL

## 2018-01-22 MED ORDER — FENTANYL CITRATE (PF) 100 MCG/2ML IJ SOLN: 25.0000 ug | INTRAMUSCULAR | Status: DC | PRN

## 2018-01-22 MED ORDER — SENNOSIDES-DOCUSATE SODIUM 8.6-50 MG PO TABS: 2.0000 | ORAL_TABLET | ORAL | Status: DC

## 2018-01-22 MED ORDER — LIDOCAINE HCL (CARDIAC) PF 100 MG/5ML IV SOSY
PREFILLED_SYRINGE | INTRAVENOUS | Status: DC | PRN
  Administered 2018-01-22 (×5): 10 mg via INTRATRACHEAL

## 2018-01-22 MED ORDER — OXYTOCIN BOLUS FROM INFUSION
500.0000 mL | Freq: Once | INTRAVENOUS | Status: AC
  Administered 2018-01-22: 500 mL via INTRAVENOUS

## 2018-01-22 MED ORDER — DIBUCAINE 1 % RE OINT: 1.0000 "application " | TOPICAL_OINTMENT | RECTAL | Status: DC | PRN

## 2018-01-22 MED ORDER — IBUPROFEN 600 MG PO TABS
600.0000 mg | ORAL_TABLET | Freq: Four times a day (QID) | ORAL | Status: DC
  Administered 2018-01-22 – 2018-01-24 (×9): 600 mg via ORAL
  Filled 2018-01-22 (×8): qty 1

## 2018-01-22 MED ORDER — FENTANYL 2.5 MCG/ML BUPIVACAINE 1/10 % EPIDURAL INFUSION (WH - ANES)
INTRAMUSCULAR | Status: AC
  Filled 2018-01-22: qty 100

## 2018-01-22 MED ORDER — LIDOCAINE HCL (PF) 1 % IJ SOLN
INTRAMUSCULAR | Status: DC | PRN
  Administered 2018-01-22: 5 mL via EPIDURAL

## 2018-01-22 MED ORDER — COCONUT OIL OIL: 1.0000 "application " | TOPICAL_OIL | Status: DC | PRN

## 2018-01-22 MED ORDER — PROPOFOL 10 MG/ML IV BOLUS
INTRAVENOUS | Status: DC | PRN
  Administered 2018-01-22 (×5): 10 mg via INTRAVENOUS

## 2018-01-22 MED ORDER — OXYCODONE-ACETAMINOPHEN 5-325 MG PO TABS
2.0000 | ORAL_TABLET | ORAL | Status: DC | PRN
  Administered 2018-01-23 – 2018-01-24 (×5): 2 via ORAL
  Filled 2018-01-22 (×5): qty 2

## 2018-01-22 MED ORDER — FENTANYL CITRATE (PF) 100 MCG/2ML IJ SOLN
INTRAMUSCULAR | Status: AC
  Filled 2018-01-22: qty 2

## 2018-01-22 MED ORDER — MEASLES, MUMPS & RUBELLA VAC ~~LOC~~ INJ: 0.5000 mL | INJECTION | Freq: Once | SUBCUTANEOUS | Status: DC

## 2018-01-22 MED ORDER — OXYTOCIN 40 UNITS IN LACTATED RINGERS INFUSION - SIMPLE MED
1.0000 m[IU]/min | INTRAVENOUS | Status: DC
  Administered 2018-01-22: 2 m[IU]/min via INTRAVENOUS

## 2018-01-22 MED ORDER — ACETAMINOPHEN 325 MG PO TABS: 650.0000 mg | ORAL_TABLET | ORAL | Status: DC | PRN

## 2018-01-22 MED ORDER — FENTANYL CITRATE (PF) 100 MCG/2ML IJ SOLN
INTRAMUSCULAR | Status: DC | PRN
  Administered 2018-01-22: 50 ug via INTRAVENOUS
  Administered 2018-01-22: 25 ug via INTRAVENOUS
  Administered 2018-01-22: 50 ug via INTRAVENOUS
  Administered 2018-01-22 (×3): 25 ug via INTRAVENOUS

## 2018-01-22 MED ORDER — LIDOCAINE HCL (CARDIAC) PF 100 MG/5ML IV SOSY
PREFILLED_SYRINGE | INTRAVENOUS | Status: AC
  Filled 2018-01-22: qty 5

## 2018-01-22 MED ORDER — OXYCODONE-ACETAMINOPHEN 5-325 MG PO TABS: 2.0000 | ORAL_TABLET | ORAL | Status: DC | PRN

## 2018-01-22 MED ORDER — SODIUM CHLORIDE 0.9 % IV SOLN
5.0000 10*6.[IU] | Freq: Once | INTRAVENOUS | Status: AC
  Administered 2018-01-22: 5 10*6.[IU] via INTRAVENOUS
  Filled 2018-01-22: qty 5

## 2018-01-22 MED ORDER — TERBUTALINE SULFATE 1 MG/ML IJ SOLN: 0.2500 mg | Freq: Once | INTRAMUSCULAR | Status: DC | PRN

## 2018-01-22 MED ORDER — MIDAZOLAM HCL 2 MG/2ML IJ SOLN
INTRAMUSCULAR | Status: AC
  Filled 2018-01-22: qty 2

## 2018-01-22 MED ORDER — ONDANSETRON HCL 4 MG/2ML IJ SOLN
INTRAMUSCULAR | Status: AC
  Filled 2018-01-22: qty 2

## 2018-01-22 MED ORDER — TETANUS-DIPHTH-ACELL PERTUSSIS 5-2.5-18.5 LF-MCG/0.5 IM SUSP: 0.5000 mL | Freq: Once | INTRAMUSCULAR | Status: DC

## 2018-01-22 MED ORDER — PHENYLEPHRINE 40 MCG/ML (10ML) SYRINGE FOR IV PUSH (FOR BLOOD PRESSURE SUPPORT)
80.0000 ug | PREFILLED_SYRINGE | INTRAVENOUS | Status: DC | PRN
  Administered 2018-01-22: 80 ug via INTRAVENOUS

## 2018-01-22 MED ORDER — OXYTOCIN 40 UNITS IN LACTATED RINGERS INFUSION - SIMPLE MED
2.5000 [IU]/h | INTRAVENOUS | Status: DC
  Administered 2018-01-22: 200 mL via INTRAVENOUS

## 2018-01-22 MED ORDER — BENZOCAINE-MENTHOL 20-0.5 % EX AERO: 1.0000 "application " | INHALATION_SPRAY | CUTANEOUS | Status: DC | PRN

## 2018-01-22 MED ORDER — DIPHENHYDRAMINE HCL 25 MG PO CAPS: 25.0000 mg | ORAL_CAPSULE | Freq: Four times a day (QID) | ORAL | Status: DC | PRN

## 2018-01-22 MED ORDER — LACTATED RINGERS IV SOLN: 500.0000 mL | INTRAVENOUS | Status: DC | PRN

## 2018-01-22 MED ORDER — PHENYLEPHRINE 40 MCG/ML (10ML) SYRINGE FOR IV PUSH (FOR BLOOD PRESSURE SUPPORT): 80.0000 ug | PREFILLED_SYRINGE | INTRAVENOUS | Status: DC | PRN

## 2018-01-22 MED ORDER — LIDOCAINE HCL (PF) 1 % IJ SOLN: 30.0000 mL | INTRAMUSCULAR | Status: DC | PRN

## 2018-01-22 MED ORDER — DIPHENHYDRAMINE HCL 50 MG/ML IJ SOLN: 12.5000 mg | INTRAMUSCULAR | Status: DC | PRN

## 2018-01-22 MED ORDER — LACTATED RINGERS IV SOLN
INTRAVENOUS | Status: DC
  Administered 2018-01-22 (×2): via INTRAVENOUS

## 2018-01-22 MED ORDER — WITCH HAZEL-GLYCERIN EX PADS: 1.0000 "application " | MEDICATED_PAD | CUTANEOUS | Status: DC | PRN

## 2018-01-22 MED ORDER — SIMETHICONE 80 MG PO CHEW: 80.0000 mg | CHEWABLE_TABLET | ORAL | Status: DC | PRN

## 2018-01-22 MED ORDER — SODIUM BICARBONATE 8.4 % IV SOLN
INTRAVENOUS | Status: AC
  Filled 2018-01-22: qty 50

## 2018-01-22 MED ORDER — STERILE WATER FOR IRRIGATION IR SOLN
Status: DC | PRN
  Administered 2018-01-22: 1000 mL

## 2018-01-22 MED ORDER — IBUPROFEN 600 MG PO TABS
ORAL_TABLET | ORAL | Status: AC
  Administered 2018-01-23: 600 mg via ORAL
  Filled 2018-01-22: qty 1

## 2018-01-22 MED ORDER — PRENATAL MULTIVITAMIN CH
1.0000 | ORAL_TABLET | Freq: Every day | ORAL | Status: DC
  Administered 2018-01-24: 1 via ORAL
  Filled 2018-01-22: qty 1

## 2018-01-22 MED ORDER — OXYCODONE HCL 5 MG PO TABS: 10.0000 mg | ORAL_TABLET | ORAL | Status: DC | PRN

## 2018-01-22 MED ORDER — SENNOSIDES-DOCUSATE SODIUM 8.6-50 MG PO TABS
2.0000 | ORAL_TABLET | ORAL | Status: DC
  Administered 2018-01-23 (×2): 2 via ORAL
  Filled 2018-01-22 (×2): qty 2

## 2018-01-22 MED ORDER — MISOPROSTOL 25 MCG QUARTER TABLET: 25.0000 ug | ORAL_TABLET | ORAL | Status: DC | PRN

## 2018-01-22 MED ORDER — SODIUM CHLORIDE 0.9 % IR SOLN
Status: DC | PRN
  Administered 2018-01-22: 1

## 2018-01-22 MED ORDER — OXYCODONE HCL 5 MG PO TABS: 5.0000 mg | ORAL_TABLET | ORAL | Status: DC | PRN

## 2018-01-22 MED ORDER — OXYCODONE-ACETAMINOPHEN 5-325 MG PO TABS: 1.0000 | ORAL_TABLET | ORAL | Status: DC | PRN

## 2018-01-22 MED ORDER — LACTATED RINGERS IV SOLN
500.0000 mL | Freq: Once | INTRAVENOUS | Status: AC
  Administered 2018-01-22: 500 mL via INTRAVENOUS

## 2018-01-22 MED ORDER — PENICILLIN G 3 MILLION UNITS IVPB - SIMPLE MED
3.0000 10*6.[IU] | INTRAVENOUS | Status: DC
  Administered 2018-01-22: 3 10*6.[IU] via INTRAVENOUS
  Filled 2018-01-22: qty 100

## 2018-01-22 SURGICAL SUPPLY — 25 items
APL SKNCLS STERI-STRIP NONHPOA (GAUZE/BANDAGES/DRESSINGS) ×1
BENZOIN TINCTURE PRP APPL 2/3 (GAUZE/BANDAGES/DRESSINGS) ×1 IMPLANT
BLADE SURG 11 STRL SS (BLADE) ×2 IMPLANT
CLIP FILSHIE TUBAL LIGA STRL (Clip) ×1 IMPLANT
DRSG OPSITE 4X5.5 SM (GAUZE/BANDAGES/DRESSINGS) ×1 IMPLANT
DRSG OPSITE POSTOP 3X4 (GAUZE/BANDAGES/DRESSINGS) ×2 IMPLANT
DURAPREP 26ML APPLICATOR (WOUND CARE) ×2 IMPLANT
GLOVE BIOGEL PI IND STRL 7.0 (GLOVE) ×1 IMPLANT
GLOVE BIOGEL PI IND STRL 7.5 (GLOVE) ×1 IMPLANT
GLOVE BIOGEL PI INDICATOR 7.0 (GLOVE) ×1
GLOVE BIOGEL PI INDICATOR 7.5 (GLOVE) ×1
GLOVE ECLIPSE 7.5 STRL STRAW (GLOVE) ×2 IMPLANT
GOWN STRL REUS W/TWL LRG LVL3 (GOWN DISPOSABLE) ×4 IMPLANT
NEEDLE HYPO 22GX1.5 SAFETY (NEEDLE) ×2 IMPLANT
NS IRRIG 1000ML POUR BTL (IV SOLUTION) ×2 IMPLANT
PACK ABDOMINAL MINOR (CUSTOM PROCEDURE TRAY) ×2 IMPLANT
PROTECTOR NERVE ULNAR (MISCELLANEOUS) ×2 IMPLANT
SPONGE LAP 4X18 RFD (DISPOSABLE) IMPLANT
STRIP CLOSURE SKIN 1/2X4 (GAUZE/BANDAGES/DRESSINGS) ×1 IMPLANT
SUT PLAIN 0 NONE (SUTURE) ×2 IMPLANT
SUT VICRYL 0 UR6 27IN ABS (SUTURE) ×2 IMPLANT
SUT VICRYL 4-0 PS2 18IN ABS (SUTURE) ×2 IMPLANT
SYR CONTROL 10ML LL (SYRINGE) ×2 IMPLANT
TOWEL OR 17X24 6PK STRL BLUE (TOWEL DISPOSABLE) ×4 IMPLANT
TRAY FOLEY W/BAG SLVR 14FR (SET/KITS/TRAYS/PACK) ×2 IMPLANT

## 2018-01-22 NOTE — Progress Notes (Signed)
Patient ID: Michelle Page, female   DOB: December 05, 1975, 42 y.o.   MRN: [id number] Michelle Page is a 42 y.o. [id number] at [redacted]w[redacted]d admitted for induction of labor due to A2DM, LGA, AMA.  Subjective: Uncomfortable w/ uc's, wants epidural  Objective: 97.6, 75, 18, 118/77 FHT:  FHR: 130 bpm, variability: moderate,  accelerations:  Present,  decelerations:  Absent UC:   q 3-59min  SVE:   4/90/-2, vtx by RN Pitocin @ 4 mu/min  Labs: Lab Results  Component Value Date   WBC 6.3 01/22/2018   HGB 12.1 01/22/2018   HCT 35.4 (L) 01/22/2018   MCV 89.6 01/22/2018   PLT 132 (L) 01/22/2018    Assessment / Plan: Induction of labor due to A2DM, LGA, AMA,  progressing well on pitocin  Labor: Progressing normally Fetal Wellbeing:  Category I Pain Control:  wants epidural Pre-eclampsia: n/a I/D:  pcn for GBS+ Anticipated MOD:  NSVD  Roma Schanz CNM, WHNP-BC 01/22/2018, [phone]

## 2018-01-22 NOTE — Transfer of Care (Addendum)
Immediate Anesthesia Transfer of Care Note  Patient: Michelle Page  Procedure(s) Performed: POST PARTUM TUBAL LIGATION (N/A )  Patient Location: PACU  Anesthesia Type:Epidural  Level of Consciousness: awake, alert  and oriented  Airway & Oxygen Therapy: Patient Spontanous Breathing  Post-op Assessment: Report given to RN and Post -op Vital signs reviewed and stable  Post vital signs: Reviewed and stable  Last Vitals:  Vitals Value Taken Time  BP 107/61 01/22/2018 11:08 AM  Temp    Pulse 71 01/22/2018 11:10 AM  Resp 12 01/22/2018 11:10 AM  SpO2 97 % 01/22/2018 11:10 AM  Vitals shown include unvalidated device data.  Last Pain:  Vitals:   01/22/18 0903  TempSrc: Oral  PainSc:          Complications: No apparent anesthesia complications

## 2018-01-22 NOTE — Anesthesia Procedure Notes (Signed)
Epidural Patient location during procedure: OB Start time: 01/22/2018 5:24 AM End time: 01/22/2018 5:36 AM  Staffing Anesthesiologist: Barnet Glasgow, MD Performed: anesthesiologist   Preanesthetic Checklist Completed: patient identified, site marked, surgical consent, pre-op evaluation, timeout performed, IV checked, risks and benefits discussed and monitors and equipment checked  Epidural Patient position: sitting Prep: site prepped and draped and DuraPrep Patient monitoring: continuous pulse ox and blood pressure Approach: midline Location: L3-L4 Injection technique: LOR air  Needle:  Needle type: Tuohy  Needle gauge: 17 G Needle length: 9 cm and 9 Needle insertion depth: 7 cm Catheter type: closed end flexible Catheter size: 19 Gauge Catheter at skin depth: 12 cm Test dose: negative  Assessment Events: blood not aspirated, injection not painful, no injection resistance, negative IV test and no paresthesia  Additional Notes 1 attempt patient tolerated procedure well

## 2018-01-22 NOTE — Telephone Encounter (Signed)
Lmom for pt to call us back to schedule pp appointment.  01-22-18  AS

## 2018-01-22 NOTE — Anesthesia Preprocedure Evaluation (Addendum)
Anesthesia Evaluation  Patient identified by MRN, date of birth, ID band Patient awake    Reviewed: Allergy & Precautions, NPO status , Patient's Chart, lab work & pertinent test results  Airway Mallampati: II  TM Distance: >3 FB Neck ROM: Full    Dental no notable dental hx. (+) Teeth Intact, Dental Advisory Given   Pulmonary former smoker,    Pulmonary exam normal breath sounds clear to auscultation       Cardiovascular hypertension (gestational), Normal cardiovascular exam Rhythm:Regular Rate:Normal     Neuro/Psych  Headaches, Anxiety Depression    GI/Hepatic negative GI ROS, Neg liver ROS,   Endo/Other  negative endocrine ROSdiabetes, Gestational, Oral Hypoglycemic Agents  Renal/GU negative Renal ROS  negative genitourinary   Musculoskeletal negative musculoskeletal ROS (+)   Abdominal   Peds  Hematology  (+) anemia ,   Anesthesia Other Findings AMA  Reproductive/Obstetrics                            Anesthesia Physical Anesthesia Plan  ASA: III  Anesthesia Plan: Epidural   Post-op Pain Management:    Induction:   PONV Risk Score and Plan: 2 and Treatment may vary due to age or medical condition  Airway Management Planned: Natural Airway  Additional Equipment:   Intra-op Plan:   Post-operative Plan:   Informed Consent: I have reviewed the patients History and Physical, chart, labs and discussed the procedure including the risks, benefits and alternatives for the proposed anesthesia with the patient or authorized representative who has indicated his/her understanding and acceptance.     Plan Discussed with: Anesthesiologist  Anesthesia Plan Comments: (Patient identified. Risks, benefits, options discussed with patient including but not limited to bleeding, infection, nerve damage, paralysis, failed block, incomplete pain control, headache, blood pressure changes, nausea,  vomiting, reactions to medication, itching, and post partum back pain. Confirmed with bedside nurse the patient's most recent platelet count. Confirmed with the patient that they are not taking any anticoagulation, have any bleeding history or any family history of bleeding disorders. Patient expressed understanding and wishes to proceed. All questions were answered. )        Anesthesia Quick Evaluation

## 2018-01-22 NOTE — Progress Notes (Signed)
Patient ID: Michelle Page, female   DOB: [birth year], 42 y.o.   MRN: [id number] Hiroko ELIETTE DRUMWRIGHT is a 42 y.o. [id number] at [redacted]w[redacted]d admitted for induction of labor due to A2DM, LGA, AMA.  Subjective: Comfortable w/ epidural, no complaints  Objective: 97.9, 85, 18, 113/65 FHT:  FHR: 125 bpm, variability: moderate,  accelerations:  Present,  decelerations:  Absent UC:   regular, every 2-4 minutes  SVE:   Dilation: 10 Effacement (%): 100 Station: Plus 2 Exam by:: [name], CNM  BBOW, partner left to get something to eat, will hold off on AROM until he's back  Pitocin @ 6 mu/min  Labs: Lab Results  Component Value Date   WBC 6.3 01/22/2018   HGB 12.1 01/22/2018   HCT 35.4 (L) 01/22/2018   MCV 89.6 01/22/2018   PLT 132 (L) 01/22/2018    Assessment / Plan: Induction of labor due to A2DM, LGA, AMA,  progressing well on pitocin, plan AROM when partner back  Labor: Progressing normally Fetal Wellbeing:  Category I Pain Control:  Epidural Pre-eclampsia: n/a I/D:  pcn for GBS+ Anticipated MOD:  NSVD  Roma Schanz CNM, WHNP-BC 01/22/2018, [phone]

## 2018-01-22 NOTE — Anesthesia Postprocedure Evaluation (Signed)
Anesthesia Post Note  Patient: Michelle Page  Procedure(s) Performed: POST PARTUM TUBAL LIGATION (N/A )     Patient location during evaluation: Mother Baby Anesthesia Type: Epidural Level of consciousness: awake and alert Pain management: pain level controlled Vital Signs Assessment: post-procedure vital signs reviewed and stable Respiratory status: spontaneous breathing, nonlabored ventilation and respiratory function stable Cardiovascular status: stable Postop Assessment: no headache, no backache, epidural receding, able to ambulate, adequate PO intake, no apparent nausea or vomiting and patient able to bend at knees Anesthetic complications: no    Last Vitals:  Vitals:   01/22/18 1215 01/22/18 1250  BP: 117/70 109/71  Pulse: 72 62  Resp: (!) 23 18  Temp:  36.5 C  SpO2: 95% 96%    Last Pain:  Vitals:   01/22/18 1609  TempSrc:   PainSc: 0-No pain   Pain Goal:                 [employer]

## 2018-01-22 NOTE — Anesthesia Preprocedure Evaluation (Addendum)
Anesthesia Evaluation  Patient identified by MRN, date of birth, ID band Patient awake    Reviewed: Allergy & Precautions, NPO status , Patient's Chart, lab work & pertinent test results  Airway Mallampati: II  TM Distance: >3 FB Neck ROM: Full    Dental no notable dental hx.    Pulmonary neg pulmonary ROS, former smoker,    Pulmonary exam normal breath sounds clear to auscultation       Cardiovascular hypertension, Normal cardiovascular exam Rhythm:Regular Rate:Normal     Neuro/Psych  Headaches, Anxiety Depression    GI/Hepatic   Endo/Other  diabetes, Gestational, Oral Hypoglycemic Agents  Renal/GU      Musculoskeletal   Abdominal   Peds  Hematology  (+) anemia ,   Anesthesia Other Findings Gestational thrombocytopenia  Reproductive/Obstetrics (+) Pregnancy                            Lab Results  Component Value Date   WBC 6.3 01/22/2018   HGB 12.1 01/22/2018   HCT 35.4 (L) 01/22/2018   MCV 89.6 01/22/2018   PLT 132 (L) 01/22/2018    Anesthesia Physical Anesthesia Plan  ASA: III  Anesthesia Plan: Epidural   Post-op Pain Management:    Induction:   PONV Risk Score and Plan: Treatment may vary due to age or medical condition  Airway Management Planned: Natural Airway  Additional Equipment:   Intra-op Plan:   Post-operative Plan:   Informed Consent: I have reviewed the patients History and Physical, chart, labs and discussed the procedure including the risks, benefits and alternatives for the proposed anesthesia with the patient or authorized representative who has indicated his/her understanding and acceptance.     Plan Discussed with: Anesthesiologist  Anesthesia Plan Comments: (Patient identified. Risks, benefits, options discussed with patient including but not limited to bleeding, infection, nerve damage, paralysis, failed block, incomplete pain control, headache,  blood pressure changes, nausea, vomiting, reactions to medication, itching, and post partum back pain. Confirmed with bedside nurse the patient's most recent platelet count. Confirmed with the patient that they are not taking any anticoagulation, have any bleeding history or any family history of bleeding disorders. Patient expressed understanding and wishes to proceed. All questions were answered. )       Anesthesia Quick Evaluation

## 2018-01-22 NOTE — Op Note (Addendum)
Michelle Page 01/22/2018 11:51 AM  PREOPERATIVE DIAGNOSIS:  Undesired fertility  POSTOPERATIVE DIAGNOSIS:  Undesired fertility  PROCEDURE:  Postpartum Bilateral Tubal Sterilization using Pomeroy and Filshie method   SURGEON: Surgeon(s) and Role:    * Sybel Standish, Ailene Rud, MD - Primary    * Nicolette Bang, DO - OB Fellow  ANESTHESIA:  Epidural  COMPLICATIONS:  None immediate.  ESTIMATED BLOOD LOSS:  Less than 20cc.  FLUIDS: 100 cc LR.  URINE OUTPUT:  250 cc of clear urine.  INDICATIONS: 42 y.o. yo [id number]  with undesired fertility,status post vaginal delivery, desires permanent sterilization. Risks and benefits of procedure discussed with patient including permanence of method, bleeding, infection, injury to surrounding organs and need for additional procedures. Risk failure of 0.5-1% with increased risk of ectopic gestation if pregnancy occurs was also discussed with patient.   FINDINGS:  Normal uterus, tubes, and ovaries.  TECHNIQUE: After informed consent was obtained, the patient was taken to the operating room where anesthesia was induced and found to be adequate. A small transverse, infraumbilical skin incision was made with the scalpel. This incision was carried down to the underlying layer of fascia. The fascia was grasped with Kocher clamps tented up and entered sharply with Mayo scissors. Underlying peritoneum was then identified tented up and entered sharply with Metzenbaum scissors.  The patient's right fallopian tube was then identified, brought to the incision, and grasped with a Babcock clamp. The tube was then followed out to the fimbria. The Babcock clamp was then used to grasp the tube approximately 4 cm from the cornual region. A 3 cm segment of the tube was then ligated with free tie of plain gut suture x2, transected and excised. Good hemostasis was noted and the tube was returned to the abdomen. Attention was then turned to the left fallopian tube which  was identified and followed out to the fimbriated end.  A Filshie clip was placed on the left fallopian tube about 2 cm from the cornual attachment (as could not mobilize sufficient amount to safely apply tie), with care given to incorporate the underlying mesosalpinx. A small tear in the mesosalpinx was noted; hemostatic. The tube was returned to the abdomen. The fascia was re-approximated with 0 Vicryl. 10 cc quarter percent Marcaine solution was then injected at the incision site. The skin was closed in a subcuticular fashion with 3-0 Vicryl.  The patient tolerated the procedure well. Sponge, lap, and needle count were correct x2. The patient was taken to recovery room in stable condition.  Phill Myron, D.O. OB Fellow  01/22/2018, 11:51 AM   Attestation of Attending Supervision of Provider:  Evaluation and management procedures were performed by this provider under my supervision and collaboration. I have reviewed the provider's note and chart, and I agree with the management and plan.   Laurey Arrow, MD Faculty Practice, [facility]

## 2018-01-22 NOTE — Anesthesia Postprocedure Evaluation (Signed)
Anesthesia Post Note  Patient: Michelle Page  Procedure(s) Performed: POST PARTUM TUBAL LIGATION (N/A )     Patient location during evaluation: PACU Anesthesia Type: Epidural Level of consciousness: awake and alert Pain management: pain level controlled Vital Signs Assessment: post-procedure vital signs reviewed and stable Respiratory status: spontaneous breathing, nonlabored ventilation and respiratory function stable Cardiovascular status: stable Postop Assessment: no headache, no backache and epidural receding Anesthetic complications: no    Last Vitals:  Vitals:   01/22/18 1215 01/22/18 1250  BP: 117/70 109/71  Pulse: 72 62  Resp: (!) 23 18  Temp:  36.5 C  SpO2: 95% 96%    Last Pain:  Vitals:   01/22/18 1400  TempSrc:   PainSc: 0-No pain   Pain Goal:                 Chelsey L Woodrum

## 2018-01-22 NOTE — H&P (Signed)
Michelle Page is a 42 y.o. [id number] female at 38w1dby LMP c/w 7wk u/s, presenting for IOL d/t A2DM and suspected LGA.   Reports active fetal movement, contractions: irregular uc's, vaginal bleeding: none, membranes: intact. Initiated prenatal care at [facility] at 10 wks.   Most recent growth u/s 9/10 @ 37wks, EFW 97%/3812g- pelvis proven to 3742g.   This pregnancy complicated by: AMA [id number] A2DM metformin 5062mBID Suspected LGA H/O LEEP  Acute appendicitis w/ appendectomy 08/14/17 @ 16wks H/O GHTN Depression- no meds  Prenatal History/Complications:  EAB x 1 Term uncomplicated SVB x 6, h/o rapid labor  Past Medical History: Past Medical History:  Diagnosis Date  . Abnormal Pap smear    colpo/leep  . Anemia   . Anxiety   . [facility]   Skin  . Chlamydia infection   . Depression   . Fractures   . Gestational diabetes    metformin  . H/O candidiasis   . H/O varicella   . Headache(784.0)    migraines as a child   . Heart murmur   . High risk HPV infection   . Papanicolaou smear of cervix with positive high risk human papilloma virus (HPV) test 06/03/2017  . Pregnancy induced hypertension    1st & 2nd pregnancy  . Pregnancy induced hypertension    1st & 2nd pregnancy   . Two vessel umbilical cord, antepartum 11/26/2011  . Vaginal Pap smear, abnormal   . Yeast infection     Past Surgical History: Past Surgical History:  Procedure Laterality Date  . APPENDECTOMY  08/14/2017  . COLPOSCOPY    . LAPAROSCOPIC APPENDECTOMY N/A 08/14/2017   Procedure: APPENDECTOMY LAPAROSCOPIC;  Surgeon: CoErroll LunaMD;  [facility]: General;  Laterality: N/A;  . LEEP    . WISDOM TOOTH EXTRACTION      Obstetrical History: OB History    Gravida  8   Para  6   Term  6   Preterm  0   AB  1   Living  6     SAB      TAB  1   Ectopic      Multiple  0   Live Births  6           Social History: Social History   Socioeconomic  History  . Marital status: Significant Other    Spouse name: todd  . Number of children: 6  . Years of education: 1377. Highest education level: Not on file  Occupational History  . Occupation: unemployed    Comment: caregiver  Social Needs  . Financial resource strain: Not hard at all  . Food insecurity:    Worry: Never true    Inability: Never true  . Transportation needs:    Medical: No    Non-medical: Not on file  Tobacco Use  . Smoking status: Former Smoker    Types: Cigarettes  . Smokeless tobacco: Never Used  . Tobacco comment: quit smoking in2007  Substance and Sexual Activity  . Alcohol use: No    Frequency: Never    Comment: occ before pregnancy  . Drug use: No  . Sexual activity: Yes    Birth control/protection: None  Lifestyle  . Physical activity:    Days per week: Not on file    Minutes per session: Not on file  . Stress: To some extent  Relationships  . Social connections:    Talks on phone:  Not on file    Gets together: Not on file    Attends religious service: Not on file    Active member of club or organization: Not on file    Attends meetings of clubs or organizations: Not on file    Relationship status: Not on file  Other Topics Concern  . 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 Alcohol abuse Father   . Emphysema Father   . COPD Father   . Mental illness Father        PTSD  . Alcohol abuse Brother   . Stroke Brother   . Cancer Paternal Grandmother        skin cancer  . Multiple sclerosis Sister   . Depression Mother   . Asthma Son   . Heart disease Maternal Grandfather     Allergies: No Known Allergies  Medications Prior to Admission  Medication Sig Dispense Refill Last Dose  . aspirin EC 81 MG tablet Take 81 mg by mouth daily.   Past Month at  Unknown time  . diphenhydrAMINE (BENADRYL) 25 MG tablet Take 25 mg by mouth every 6 (six) hours as needed for allergies.   Past Week at Unknown time  . metFORMIN (GLUCOPHAGE) 500 MG tablet Take 1 tablet (500 mg total) by mouth 2 (two) times daily with a meal. 60 tablet 3 01/21/2018 at Unknown time  . omeprazole (PRILOSEC) 20 MG capsule Take 1 capsule (20 mg total) by mouth daily. 30 capsule 6 01/21/2018 at Unknown time  . prenatal vitamin w/FE, FA (PRENATAL 1 + 1) 27-1 MG TABS tablet Take 1 tablet by mouth daily at 12 noon. 30 each 12 01/21/2018 at Unknown time  . ACCU-CHEK FASTCLIX LANCETS MISC USE 1 TO CHECK GLUCOSE 4 TIMES DAILY  0 Taking  . ACCU-CHEK GUIDE test strip USE 1 STRIP TO CHECK GLUCOSE 4 TIMES DAILY  0 Taking  . Acetaminophen (TYLENOL PO) Take by mouth as needed.   Taking  . Blood Glucose Monitoring Suppl (ACCU-CHEK GUIDE) w/Device KIT USE TO CHECK BLOOD GLUCOSE  0 Taking  . UNABLE TO FIND Allergy eye drops-prn; Puralax-prn   Taking    Review of Systems  Pertinent pos/neg as indicated in HPI  Blood pressure 127/81, pulse 79, temperature 97.6 F (36.4 C), temperature source Oral, resp. rate 18, height '5\' 4"'  (1.626 m), weight 97.3 kg, last menstrual period 04/23/2017. General appearance: alert, cooperative and no distress Lungs: clear to auscultation bilaterally Heart: regular rate and rhythm Abdomen: gravid, soft, non-tender Extremities: tr edema DTR's 2+  Fetal monitoring: FHR: 120 bpm, variability: moderate,  Accelerations: Present,  decelerations:  Absent Uterine activity: irregular Dilation: 3 Effacement (%): 80 Station: -2 Exam by:: [name] CNM  Presentation: cephalic   Prenatal labs: ABO, Rh: A/Positive/-- (03/05 1158) Antibody: Negative (07/03 0859) Rubella: 4.25 (03/05 1158) RPR: Non Reactive (07/03 0859)  HBsAg: Negative (03/05 1158)  HIV: Non Reactive (07/03 0859)  GBS:     2hr GTT: 87/189/160 Genetic screening:  Informaseq normal female Anatomy US:  normal   No results found for this or any previous visit (from the past 24 hour(s)).   Assessment:  34w1dSIUP  [id number] A2DM  AMA  Suspected LGA  Grand multiparity  Cat 1  FHR  GBS  pos  Plan:  Admit to BS  IV pain meds/epidural prn active labor  Pitocin per protocol  CBGs q4hr latent phase/q2hr active phase  PCN per protocol for GBS+  Anticipate NSVB   Plans to breastfeed  Contraception: BTL (consent 7/3) vs Paragard  Circumcision: n/a  Roma Schanz CNM, WHNP-BC 01/22/2018, 1:40 AM

## 2018-01-22 NOTE — Progress Notes (Signed)
Patient desires permanent sterilization. 42 yo P7 s/p nsvd this morning. No sig bleeding, plts 130s.  Other reversible forms of contraception were discussed with patient in detail; she declines all other modalities. Risks of procedure discussed with patient including but not limited to: risk of regret, permanence of method, bleeding, infection, injury to surrounding organs and need for additional procedures.  Failure risk of 1-2 % with increased risk of ectopic gestation if pregnancy occurs was also discussed with patient.  Patient verbalized understanding of these risks and wants to proceed with sterilization.  Written informed consent obtained.  To OR when ready.

## 2018-01-22 NOTE — Addendum Note (Signed)
Addendum  created 01/22/18 1722 by Hewitt Blade, CRNA   Sign clinical note

## 2018-01-23 ENCOUNTER — Encounter (HOSPITAL_COMMUNITY): Payer: Self-pay | Admitting: Obstetrics and Gynecology

## 2018-01-23 ENCOUNTER — Other Ambulatory Visit: Payer: Medicaid Other

## 2018-01-23 LAB — GLUCOSE, CAPILLARY: Glucose-Capillary: 75 mg/dL (ref 70–99)

## 2018-01-23 NOTE — Lactation Note (Addendum)
This note was copied from a baby's chart. Lactation Consultation Note  Patient Name: Michelle Page Date: 01/23/2018 Reason for consult: Initial assessment(per mom baby recently fed well , lots of swallows / [place] encouraged to call due to sore nipples )  Baby is 20 hours old, and has been consistent at the breast.  Mom mentioned she feels like the baby is chewing when latching, and nipples feels tender and asked the LC to assess the nipples .  LC noted no redness, no break down  and when LC had mom compress areolas noted then to have edema.  LC reviewed hand expressing , several drops noted and encouraged mom to use on her  Nipples liberally, and to use the reverse pressure exercise after hand expressing to make the nipples  More compressible for a deeper latch. Mom also mentioned she has been using the cradle position.  LC recommended trying the football so the baby isn't laying on her abdomen which is tender from her BTL .  LC also instructed mom on the use of comfort gels x 6 days and hand pump , increasing the Flange to #27  If needed when milk comes in.  Mother informed of post-discharge support and given phone number to the lactation department, including services for phone call assistance; out-patient appointments; and breastfeeding support group. List of other breastfeeding resources in the community given in the handout. Encouraged mother to call for problems or concerns related to breastfeeding. LC encouraged mom to call for latch assessment.     Maternal Data Has patient been taught Hand Expression?: Yes(LC reviewed with breast assess w/ mom request ) Does the patient have breastfeeding experience prior to this delivery?: Yes  Feeding Feeding Type: (baby not showing feeding cues ) Length of feed: 30 min  LATCH Score                   Interventions Interventions: Breast feeding basics reviewed;Hand express;Reverse pressure;Support pillows;Position  options;Shells;Comfort gels;Hand pump  Lactation Tools Discussed/Used Tools: Shells;Pump;Flanges;Comfort gels Flange Size: 24;27 Shell Type: Inverted Breast pump type: Manual Pump Review: Setup, frequency, and cleaning Initiated by:: MAI  Date initiated:: 01/23/18   Consult Status Consult Status: Follow-up Date: 01/24/18 Follow-up type: In-patient    Arthur 01/23/2018, 5:01 PM

## 2018-01-23 NOTE — Progress Notes (Signed)
MOB was referred for history of depression/anxiety. * Referral screened out by Clinical Social Worker because none of the following criteria appear to apply: ~ History of anxiety/depression during this pregnancy, or of post-partum depression following prior delivery. ~ Diagnosis of anxiety and/or depression within last 3 years. No concerns noted in OB record. OR * MOB's symptoms currently being treated with medication and/or therapy.  Please contact the Clinical Social Worker if needs arise, by MOB request, or if MOB scores greater than 9/yes to question 10 on Edinburgh Postpartum Depression Screen.  Ellese Julius Boyd-Gilyard, MSW, LCSW Clinical Social Work [phone]   

## 2018-01-23 NOTE — Progress Notes (Signed)
Post Partum Day 1 Subjective: Michelle Page is a 42 year old [id number] female. She is postpartum day 1 after SVD due to  IOL for A2GDM/LGA. She reports that she is doing "okay". Patient states she has been up out of bed and reports mild abdominal pain with movement at incision site from BTL. She reports eating without nausea. Denies RUQ pain, vision change, and HA. Patient is making flatus but denies bowel movement. She reports that her bleeding "is a lot less than with my other pregnancies". She is currently breast feeding. She underwent BTL for future contraception.    Objective: Blood pressure (!) 93/55, pulse 64, temperature 97.8 F (36.6 C), resp. rate 16, height 5\' 4"  (1.626 m), weight 97.3 kg, last menstrual period 04/23/2017, SpO2 97 %, unknown if currently breastfeeding.  Physical Exam:  General: alert and cooperative  Heart: Regular rate and rhythm, no murmurs or rubs Lungs: Clear to auscultation bilaterally, no wheezes noted.  Lochia: appropriate Uterine Fundus: firm, palpable DVT Evaluation: Negative Homan's sign. No tenderness to palpation of bilateral lower extremities. No significant calf/ankle edema.  Recent Labs    01/22/18 0114  HGB 12.1  HCT 35.4*    Assessment/Plan: Assessment: Michelle Page is postpartum day 1 after IOL and SVD. She is tolerating PO intake and ambulation. Last three CBGs were 76, 85, and 87.  Plan: Continue to monitor for increased bleeding or pain. Continue to monitor CBG due to A2GDM.    LOS: 1 day   Cornell Barman 01/23/2018, 6:28 AM

## 2018-01-24 ENCOUNTER — Other Ambulatory Visit: Payer: Self-pay

## 2018-01-24 MED ORDER — IBUPROFEN 600 MG PO TABS: 600.0000 mg | ORAL_TABLET | Freq: Four times a day (QID) | ORAL | 0 refills | Status: DC

## 2018-01-24 MED ORDER — INFLUENZA VAC SPLIT QUAD 0.5 ML IM SUSY: 0.5000 mL | PREFILLED_SYRINGE | INTRAMUSCULAR | Status: DC

## 2018-01-24 MED ORDER — OXYCODONE-ACETAMINOPHEN 5-325 MG PO TABS: 1.0000 | ORAL_TABLET | ORAL | 0 refills | Status: DC | PRN

## 2018-01-24 NOTE — Lactation Note (Signed)
This note was copied from a baby's chart. Lactation Consultation Note  Patient Name: Michelle Page Date: 01/24/2018 Reason for consult: Initial assessment;Term;Infant weight loss  50 hours old FT female who is being exclusively BF by his mother, she's a P7 and experienced BF. Reviewed engorgement prevention and treatment, as well as treatment for sore nipples. Mom voiced nipple pain/discomfort is getting better now, she's probably still experiencing transient soreness; both nipples looked intact with no signs of trauma upon examination. [place] also discussed with mom red flags on when to call baby's pediatrician. Mom reported all questions were answered, she is aware of [facility] OP services and will call PRN.  Maternal Data    Feeding Feeding Type: Breast Fed Length of feed: 25 min  LATCH Score                   Interventions Interventions: Breast feeding basics reviewed  Lactation Tools Discussed/Used     Consult Status Consult Status: Complete Date: 01/24/18 Follow-up type: Call as needed    Laymon Stockert Francene Boyers 01/24/2018, 11:41 AM

## 2018-01-24 NOTE — Discharge Instructions (Signed)
Postpartum Care After Vaginal Delivery °The period of time right after you deliver your newborn is called the postpartum period. °What kind of medical care will I receive? °· You may continue to receive fluids and medicines through an IV tube inserted into one of your veins. °· If an incision was made near your vagina (episiotomy) or if you had some vaginal tearing during delivery, cold compresses may be placed on your episiotomy or your tear. This helps to reduce pain and swelling. °· You may be given a squirt bottle to use when you go to the bathroom. You may use this until you are comfortable wiping as usual. To use the squirt bottle, follow these steps: °? Before you urinate, fill the squirt bottle with warm water. Do not use hot water. °? After you urinate, while you are sitting on the toilet, use the squirt bottle to rinse the area around your urethra and vaginal opening. This rinses away any urine and blood. °? You may do this instead of wiping. As you start healing, you may use the squirt bottle before wiping yourself. Make sure to wipe gently. °? Fill the squirt bottle with clean water every time you use the bathroom. °· You will be given sanitary pads to wear. °How can I expect to feel? °· You may not feel the need to urinate for several hours after delivery. °· You will have some soreness and pain in your abdomen and vagina. °· If you are breastfeeding, you may have uterine contractions every time you breastfeed for up to several weeks postpartum. Uterine contractions help your uterus return to its normal size. °· It is normal to have vaginal bleeding (lochia) after delivery. The amount and appearance of lochia is often similar to a menstrual period in the first week after delivery. It will gradually decrease over the next few weeks to a dry, yellow-brown discharge. For most women, lochia stops completely by 6-8 weeks after delivery. Vaginal bleeding can vary from woman to woman. °· Within the first few  days after delivery, you may have breast engorgement. This is when your breasts feel heavy, full, and uncomfortable. Your breasts may also throb and feel hard, tightly stretched, warm, and tender. After this occurs, you may have milk leaking from your breasts. Your health care provider can help you relieve discomfort due to breast engorgement. Breast engorgement should go away within a few days. °· You may feel more sad or worried than normal due to hormonal changes after delivery. These feelings should not last more than a few days. If these feelings do not go away after several days, speak with your health care provider. °How should I care for myself? °· Tell your health care provider if you have pain or discomfort. °· Drink enough water to keep your urine clear or pale yellow. °· Wash your hands thoroughly with soap and water for at least 20 seconds after changing your sanitary pads, after using the toilet, and before holding or feeding your baby. °· If you are not breastfeeding, avoid touching your breasts a lot. Doing this can make your breasts produce more milk. °· If you become weak or lightheaded, or you feel like you might faint, ask for help before: °? Getting out of bed. °? Showering. °· Change your sanitary pads frequently. Watch for any changes in your flow, such as a sudden increase in volume, a change in color, the passing of large blood clots. If you pass a blood clot from your vagina, save it   to show to your health care provider. Do not flush blood clots down the toilet without having your health care provider look at them. °· Make sure that all your vaccinations are up to date. This can help protect you and your baby from getting certain diseases. You may need to have immunizations done before you leave the hospital. °· If desired, talk with your health care provider about methods of family planning or birth control (contraception). °How can I start bonding with my baby? °Spending as much time as  possible with your baby is very important. During this time, you and your baby can get to know each other and develop a bond. Having your baby stay with you in your room (rooming in) can give you time to get to know your baby. Rooming in can also help you become comfortable caring for your baby. Breastfeeding can also help you bond with your baby. °How can I plan for returning home with my baby? °· Make sure that you have a car seat installed in your vehicle. °? Your car seat should be checked by a certified car seat installer to make sure that it is installed safely. °? Make sure that your baby fits into the car seat safely. °· Ask your health care provider any questions you have about caring for yourself or your baby. Make sure that you are able to contact your health care provider with any questions after leaving the hospital. °This information is not intended to replace advice given to you by your health care provider. Make sure you discuss any questions you have with your health care provider. °Document Released: 02/11/2007 Document Revised: 09/19/2015 Document Reviewed: 03/21/2015 °Elsevier Interactive Patient Education © 2018 [employer] ° °

## 2018-01-24 NOTE — Anesthesia Postprocedure Evaluation (Signed)
Anesthesia Post Note  Patient: Michelle Page  Procedure(s) Performed: AN AD HOC LABOR EPIDURAL     Patient location during evaluation: Mother Baby Anesthesia Type: Epidural Level of consciousness: awake and alert Pain management: pain level controlled Vital Signs Assessment: post-procedure vital signs reviewed and stable Respiratory status: spontaneous breathing, nonlabored ventilation and respiratory function stable Cardiovascular status: stable Postop Assessment: no headache, no backache and epidural receding Anesthetic complications: no    Last Vitals:  Vitals:   01/23/18 1600 01/24/18 0545  BP: 104/63 116/87  Pulse: 66 71  Resp:  18  Temp: 36.6 C 36.4 C  SpO2: 97% 99%    Last Pain:  Vitals:   01/24/18 0545  TempSrc: Axillary  PainSc:                  Chelsey L Woodrum

## 2018-01-24 NOTE — Discharge Summary (Signed)
OB Discharge Summary     Patient Name: Michelle Page DOB: [birth year] MRN: [id number]  Date of admission: 01/22/2018 Delivering MD: Sharene Butters D   Date of discharge: 01/24/2018  Admitting diagnosis: INDUCTION Intrauterine pregnancy: [redacted]w[redacted]d    Secondary diagnosis:  Active Problems:   Indication for care in labor and delivery, antepartum  Additional problems: A2DM, GBS +     Discharge diagnosis: Term Pregnancy Delivered                                                                                                Post partum procedures:postpartum tubal ligation  Augmentation: AROM and Pitocin  Complications: None  Hospital course:  Induction of Labor With Vaginal Delivery   42y.o. yo [id number] admitted to the hospital 01/22/2018 for induction of labor.  Indication for induction: A2 DM.  Patient had an uncomplicated labor course as follows: Membrane Rupture Time/Date: 8:29 AM ,01/22/2018   Intrapartum Procedures: Episiotomy: None [1]                                         Lacerations:  None [1]  Patient had delivery of a Viable infant.  Information for the patient's newborn:  ChKayna, Suppairl Zemira [[id number]]Delivery Method: Vaginal, Spontaneous(Filed from Delivery Summary)   01/22/2018  Details of delivery can be found in separate delivery note.  Patient had a routine postpartum course. Patient is discharged home 01/24/18.  Physical exam  Vitals:   01/23/18 0640 01/23/18 0900 01/23/18 1600 01/24/18 0545  BP: 114/78 108/72 104/63 116/87  Pulse: 71 74 66 71  Resp: 18   18  Temp: 98.9 F (37.2 C) 97.6 F (36.4 C) 97.9 F (36.6 C) 97.6 F (36.4 C)  TempSrc:  Oral Axillary Axillary  SpO2: 97%  97% 99%  Weight:      Height:       General: alert, cooperative and no distress Lochia: appropriate Uterine Fundus: firm Incision: Healing well with no significant drainage, No significant erythema, Dressing is clean, dry, and intact DVT Evaluation: No  evidence of DVT seen on physical exam. Negative Homan's sign. No cords or calf tenderness. Labs: Lab Results  Component Value Date   WBC 6.3 01/22/2018   HGB 12.1 01/22/2018   HCT 35.4 (L) 01/22/2018   MCV 89.6 01/22/2018   PLT 132 (L) 01/22/2018   CMP Latest Ref Rng & Units 08/13/2017  Glucose 65 - 99 mg/dL 108(H)  BUN 6 - 20 mg/dL 9  Creatinine 0.44 - 1.00 mg/dL 0.46  Sodium 135 - 145 mmol/L 136  Potassium 3.5 - 5.1 mmol/L 3.7  Chloride 101 - 111 mmol/L 107  CO2 22 - 32 mmol/L 20(L)  Calcium 8.9 - 10.3 mg/dL 9.0  Total Protein 6.5 - 8.1 g/dL 6.5  Total Bilirubin 0.3 - 1.2 mg/dL 0.3  Alkaline Phos 38 - 126 U/L 48  AST 15 - 41 U/L 23  ALT 14 - 54 U/L 24    Discharge  instruction: per After Visit Summary and "Baby and Me Booklet".  After visit meds:  Allergies as of 01/24/2018   No Known Allergies     Medication List    STOP taking these medications   ACCU-CHEK FASTCLIX LANCETS Misc   ACCU-CHEK GUIDE test strip Generic drug:  glucose blood   ACCU-CHEK GUIDE w/Device Kit   acetaminophen 500 MG tablet Commonly known as:  TYLENOL   diphenhydrAMINE 25 MG tablet Commonly known as:  BENADRYL   metFORMIN 500 MG tablet Commonly known as:  GLUCOPHAGE   omeprazole 20 MG capsule Commonly known as:  PRILOSEC   PAZEO 0.7 % Soln Generic drug:  Olopatadine HCl   prenatal vitamin w/FE, FA 27-1 MG Tabs tablet   SYSTANE OP   TYLENOL PO   UNABLE TO FIND     TAKE these medications   ibuprofen 600 MG tablet Commonly known as:  ADVIL,MOTRIN Take 1 tablet (600 mg total) by mouth every 6 (six) hours.   oxyCODONE-acetaminophen 5-325 MG tablet Commonly known as:  PERCOCET/ROXICET Take 1 tablet by mouth every 4 (four) hours as needed (pain scale 4-7).       Diet: routine diet  Activity: Advance as tolerated. Pelvic rest for 6 weeks.   Outpatient follow up: Follow up Appt: Future Appointments  Date Time Provider [place]  02/27/2018 10:00 AM  Cresenzo-Dishmon, Joaquim Lai, CNM [facility]   Follow up Visit:  Postpartum contraception: Tubal Ligation  Newborn Data: Live born female  Birth Weight: 8 lb 7.8 oz (3850 g) APGAR: 18, 9  Newborn Delivery   Birth date/time:  01/22/2018 08:42:00 Delivery type:  Vaginal, Spontaneous     Baby Feeding: Breast Disposition:home with mother   01/24/2018 Christin Fudge, CNM

## 2018-01-24 NOTE — Progress Notes (Signed)
D/c instructions discussed & reviewed to include when to call the doctor, wound care, & prescription info.  Mom aware to use mom baby care booklet for reference if needed.  Mom also aware that infant is not being d/c at this time & we will not be able to provide care/medications to her once she is discharged from the system; she understands that she will be able to order meal trays.  Mom verb understanding.

## 2018-02-27 ENCOUNTER — Encounter: Payer: Self-pay | Admitting: Advanced Practice Midwife

## 2018-02-27 ENCOUNTER — Other Ambulatory Visit: Payer: Self-pay

## 2018-02-27 ENCOUNTER — Ambulatory Visit (INDEPENDENT_AMBULATORY_CARE_PROVIDER_SITE_OTHER): Payer: Medicaid Other | Admitting: Advanced Practice Midwife

## 2018-02-27 NOTE — Progress Notes (Signed)
Michelle Page is a 42 y.o. who presents for a postpartum visit. Michelle Page is 4 weeks postpartum following a spontaneous vaginal delivery. I have fully reviewed the prenatal and intrapartum course. The delivery was at 39.1 gestational weeks.  Anesthesia: epidural. Postpartum course has been uneventful. Baby's course has been uneventful. Baby is feeding by breast. Bleeding: no bleeding. Bowel function is normal. Bladder function is normal. Patient is sexually active . Contraception method is tubal ligation. Postpartum depression screening: negative.   Current Outpatient Medications:  .  ibuprofen (ADVIL,MOTRIN) 600 MG tablet, Take 1 tablet (600 mg total) by mouth every 6 (six) hours., Disp: 30 tablet, Rfl: 0 .  oxyCODONE-acetaminophen (PERCOCET/ROXICET) 5-325 MG tablet, Take 1 tablet by mouth every 4 (four) hours as needed (pain scale 4-7). (Patient not taking: Reported on 02/27/2018), Disp: 30 tablet, Rfl: 0 .  Prenatal Vit-Fe Fumarate-FA (PRENATAL VITAMIN PLUS LOW IRON) 27-1 MG TABS, TAKE 1 TABLET BY MOUTH ONCE DAILY AT 12 NOON, Disp: , Rfl: 0  Review of Systems   Constitutional: Negative for fever and chills Eyes: Negative for visual disturbances Respiratory: Negative for shortness of breath, dyspnea Cardiovascular: Negative for chest pain or palpitations  Gastrointestinal: Negative for vomiting, diarrhea and constipation Genitourinary: Negative for dysuria and urgency Musculoskeletal: Negative for back pain, joint pain, myalgias  Neurological: Negative for dizziness and headaches    Objective:     Vitals:   02/27/18 1008  BP: 119/78  Pulse: 64   General:  alert, cooperative and no distress   Breasts:  negative  Lungs: Normal respiratory effort  Heart:  regular rate and rhythm  Abdomen: Soft, nontender   Vulva:  normal  Vagina: normal vagina  Cervix:  closed  Corpus: Well involuted     Rectal Exam: Hemorrhoids  internally        Assessment:    normal postpartum  exam.  Plan:   1. Contraception: tubal ligation 2. Follow up in: 4 weeks or as needed.

## 2018-03-26 ENCOUNTER — Other Ambulatory Visit: Payer: Medicaid Other

## 2018-03-26 DIAGNOSIS — O0993 Supervision of high risk pregnancy, unspecified, third trimester: Secondary | ICD-10-CM

## 2018-03-26 NOTE — Addendum Note (Signed)
Addended by: Christiana Pellant A on: 03/26/2018 08:31 AM   Modules accepted: Orders

## 2018-03-27 LAB — GLUCOSE TOLERANCE, 2 HOURS W/ 1HR
Glucose, 1 hour: 109 mg/dL (ref 65–179)
Glucose, 2 hour: 75 mg/dL (ref 65–152)
Glucose, Fasting: 81 mg/dL (ref 65–91)

## 2018-07-18 ENCOUNTER — Encounter (INDEPENDENT_AMBULATORY_CARE_PROVIDER_SITE_OTHER): Payer: Self-pay | Admitting: Nurse Practitioner

## 2018-07-23 NOTE — Progress Notes (Signed)
Virtual Visit via Video Note  I connected with Michelle Page on 07/29/18 at  3:00 PM EDT by a video enabled telemedicine application and verified that I am speaking with the correct person using two identifiers.   I discussed the limitations of evaluation and management by telemedicine and the availability of in person appointments. The patient expressed understanding and agreed to proceed.    I discussed the assessment and treatment plan with the patient. The patient was provided an opportunity to ask questions and all were answered. The patient agreed with the plan and demonstrated an understanding of the instructions.   The patient was advised to call back or seek an in-person evaluation if the symptoms worsen or if the condition fails to improve as anticipated.  I provided 60 minutes of non-face-to-face time during this encounter.   Norman Clay, MD     Psychiatric Initial Adult Assessment   Patient Identification: Michelle Page MRN:  [id number] Date of Evaluation:  07/29/2018 Referral Source: Saddie Benders, MD Chief Complaint:   Chief Complaint    Depression; Psychiatric Evaluation    "My mood is crazy" Visit Diagnosis: No diagnosis found.  History of Present Illness:   Michelle Page is a 43 y.o. year old female with a history of postpartum depression , who is referred for depression.   She states that she asked this referral as she believes "My mood is crazy." She tends to be irritable and isolates herself. Her husband also thinks that she has becoming moe mood swing.  Although she might have good energy and some days, she wants to stay in the bed on other days. She takes good care of her six children, including the youngest, 41 old daughter.  She states that her husband also takes good care of her children, although her ex-husband did not.  She states that it was "devastating" and she cried for six months when her ex-husband left without notice after 13  years of marriage.   Depression-she has occasional insomnia.  She feels fatigue. She has anhedonia at times, although she mostly enjoys being with her children or joining kids group (which she has not been able to do due to COVID 19).  She has good appetite.  She denies SI.   Anxiety-she tends to feel anxious, and she occasionally has panic attacks.   Bipolar disorder- she reports decreased need for sleep for 3 days, occurred two months ago. She felt energized at that time, and took nap for one hour only. She denies other time of euphoria or increased goal directed behavior. She was diagnosed with bipolar disorder when she was a teenager, although she does not recall details. She reports that her mother became "mean" after her mother maintained abstinence from alcohol use. That was the time the patient was committed the patient to a hospital for a few days and was diagnosed with bipolar disorder.   She denies alcohol use or drug use.   Medication- she has been sertraline, 50 mg for the past few days.   Associated Signs/Symptoms: Depression Symptoms:  depressed mood, insomnia, fatigue, anxiety, irritable (Hypo) Manic Symptoms:  had three days of decreased need for sleep Anxiety Symptoms:  Excessive Worry, Psychotic Symptoms:  denies AH, VH PTSD Symptoms: Negative  Past Psychiatric History:  Outpatient: diagnosed with bipolar disorder when she was a teenager Psychiatry admission: admitted when she was a teenager for "mood swing" Previous suicide attempt: denies  Past trials of medication: citalopram, bupropion, venlafaxine, carbamazepine, quetiapine (  drowsiness), risperidone, Abilify History of violence:   Previous Psychotropic Medications: Yes   Substance Abuse History in the last 12 months:  No.  Consequences of Substance Abuse: NA  Past Medical History:  Past Medical History:  Diagnosis Date  . Abnormal Pap smear    colpo/leep  . Anemia   . Anxiety   . Cancer [facility]) 2006    Skin  . Chlamydia infection   . Depression   . Fractures   . Gestational diabetes    metformin  . H/O candidiasis   . H/O varicella   . Headache(784.0)    migraines as a child   . Heart murmur   . High risk HPV infection   . Papanicolaou smear of cervix with positive high risk human papilloma virus (HPV) test 06/03/2017  . Pregnancy induced hypertension    1st & 2nd pregnancy  . Pregnancy induced hypertension    1st & 2nd pregnancy   . Two vessel umbilical cord, antepartum 11/26/2011  . Vaginal Pap smear, abnormal   . Yeast infection     Past Surgical History:  Procedure Laterality Date  . APPENDECTOMY  08/14/2017  . COLPOSCOPY    . LAPAROSCOPIC APPENDECTOMY N/A 08/14/2017   Procedure: APPENDECTOMY LAPAROSCOPIC;  Surgeon: Erroll Luna, MD;  Location: [place];  Service: General;  Laterality: N/A;  . LEEP    . TUBAL LIGATION N/A 01/22/2018   Procedure: POST PARTUM TUBAL LIGATION;  Surgeon: Gwynne Edinger, MD;  Location: [place];  Service: Gynecology;  Laterality: N/A;  . WISDOM TOOTH EXTRACTION      Family Psychiatric History:  As below  Family History:  Family History  Problem Relation Age of Onset  . Alcohol abuse Father   . Emphysema Father   . COPD Father   . Mental illness Father        PTSD  . Post-traumatic stress disorder Father   . Alcohol abuse Brother   . Stroke Brother   . Cancer Paternal Grandmother        skin cancer  . Multiple sclerosis Sister   . Depression Mother   . Bipolar disorder Mother   . Anxiety disorder Mother   . Asthma Son   . Heart disease Maternal Grandfather     Social History:   Social History   Socioeconomic History  . Marital status: Significant Other    Spouse name: todd  . Number of children: 6  . Years of education: 31  . Highest education level: Not on file  Occupational History  . Occupation: unemployed    Comment: caregiver  Social Needs  . Financial resource strain: Not hard at all  . Food  insecurity:    Worry: Never true    Inability: Never true  . Transportation needs:    Medical: No    Non-medical: Not on file  Tobacco Use  . Smoking status: Former Smoker    Types: Cigarettes  . Smokeless tobacco: Never Used  . Tobacco comment: quit smoking in2007  Substance and Sexual Activity  . Alcohol use: No    Frequency: Never    Comment: occ before pregnancy  . Drug use: No  . Sexual activity: Yes    Birth control/protection: None, Surgical  Lifestyle  . Physical activity:    Days per week: Not on file    Minutes per session: Not on file  . Stress: To some extent  Relationships  . Social connections:    Talks on phone: Not on file  Gets together: Not on file    Attends religious service: Not on file    Active member of club or organization: Not on file    Attends meetings of clubs or organizations: Not on file    Relationship status: Not on file  Other Topics Concern  . Not on file  Social History Narrative   Lives in home with children   Dad has attached apartment   Lives with Sherren Mocha - sig other   Cares for children and dad    Additional Social History:  Married for a few years, her ex-husband suddenly left after 36 years of marriage.  Six children (six months old to a teenager, four of them are from prior marriage).  Her father lives with the patient.  She grew up in [place]. She was raised by her mother, who used to abuse alcohol. Her mother got "mean" after she maintained sobriety- the patient was diagnosed with bipolar disorder since then.  Used to be on disability for bipolar disorder at age 74 through late 20's (it was cancelled as her husband at that time had income)  Allergies:  No Known Allergies  Metabolic Disorder Labs: No results found for: HGBA1C, MPG No results found for: PROLACTIN Lab Results  Component Value Date   CHOL 148 01/29/2017   TRIG 115 01/29/2017   HDL 45 (L) 01/29/2017   CHOLHDL 3.3 01/29/2017   LDLCALC 82 01/29/2017    Lab Results  Component Value Date   TSH 2.21 01/29/2017    Therapeutic Level Labs: No results found for: LITHIUM No results found for: CBMZ No results found for: VALPROATE  Current Medications: Current Outpatient Medications  Medication Sig Dispense Refill  . Prenatal Vit-Fe Fumarate-FA (PRENATAL VITAMIN PLUS LOW IRON) 27-1 MG TABS TAKE 1 TABLET BY MOUTH ONCE DAILY AT 12 NOON  0  . sertraline (ZOLOFT) 50 MG tablet Take 50 mg by mouth daily.    Marland Kitchen ibuprofen (ADVIL,MOTRIN) 600 MG tablet Take 1 tablet (600 mg total) by mouth every 6 (six) hours. (Patient not taking: Reported on 07/29/2018) 30 tablet 0  . oxyCODONE-acetaminophen (PERCOCET/ROXICET) 5-325 MG tablet Take 1 tablet by mouth every 4 (four) hours as needed (pain scale 4-7). (Patient not taking: Reported on 02/27/2018) 30 tablet 0   No current facility-administered medications for this visit.     Musculoskeletal: Strength & Muscle Tone: within normal limits Gait & Station: normal Patient leans: N/A  Psychiatric Specialty Exam: Review of Systems  Psychiatric/Behavioral: Positive for depression. Negative for hallucinations, memory loss, substance abuse and suicidal ideas. The patient is nervous/anxious and has insomnia.   All other systems reviewed and are negative.   currently breastfeeding.There is no height or weight on file to calculate BMI.  General Appearance: Fairly Groomed  Eye Contact:  Good  Speech:  Clear and Coherent  Volume:  Normal  Mood:  Anxious  Affect:  Appropriate, Congruent and slightly down, but reactive  Thought Process:  Coherent  Orientation:  Full (Time, Place, and Person)  Thought Content:  Logical  Suicidal Thoughts:  No  Homicidal Thoughts:  No  Memory:  Immediate;   Good  Judgement:  Good  Insight:  Good  Psychomotor Activity:  Normal  Concentration:  Concentration: Good and Attention Span: Good  Recall:  Good  Fund of Knowledge:Good  Language: Good  Akathisia:  No  Handed:  Right   AIMS (if indicated):  not done  Assets:  Communication Skills Desire for Improvement  ADL's:  Intact  Cognition:  WNL  Sleep:  Poor   Screenings: PHQ2-9     [facility] Initial Prenatal from 07/02/2017 in [place] Visit from 05/29/2017 in [place] Visit from 01/29/2017 in [facility]  PHQ-2 Total Score  0  0  0  0  PHQ-9 Total Score  -  2  -  -      Assessment and Plan:  Kaelani CAMRIE STOCK is a 43 y.o. year old female with a history of postpartum depression, breast feeding , bipolar disorder by history,  who is referred for depression.   # MDD, moderate, recurrent without psychotic features She reports worsening in irritability fatigue over the past few months.  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  Given sertraline has been uptitrated by her PCP, will continue current dose at this time to allow medication to exert its full effect. Discussed potential risk of sertraline excreted in breast milk, although benefit of treating maternal depression outweighs risk of infant exposure to SSRI.  Noted that although she was diagnosed with bipolar disorder as a child, she denies any hypomanic/manic episode except 3 days of decreased need for sleep, which occurred 2 months ago.  Will continue to monitor. Discussed behavioral activation.  Although she will greatly benefit from CBT, she would like to hold this option as she has a baby.   Plan 1. Continue sertraline 50 mg daily 2. Return to clinic on 4/29 at 10 AM for 30 mins, video visit 3. TSH wnl six weeks ago by history   The patient demonstrates the following risk factors for suicide: Chronic risk factors for suicide include: psychiatric disorder of depression. Acute risk factors for suicide  include: unemployment. Protective factors for this patient include: positive social support, responsibility to others (children, family), coping skills and hope for the future. Considering these factors, the overall suicide risk at this point appears to be low. Patient is appropriate for outpatient follow up.   Norman Clay, MD 3/31/20203:56 PM

## 2018-07-29 ENCOUNTER — Encounter (HOSPITAL_COMMUNITY): Payer: Self-pay | Admitting: Psychiatry

## 2018-07-29 ENCOUNTER — Other Ambulatory Visit: Payer: Self-pay

## 2018-07-29 ENCOUNTER — Ambulatory Visit (INDEPENDENT_AMBULATORY_CARE_PROVIDER_SITE_OTHER): Payer: Medicaid Other | Admitting: Psychiatry

## 2018-07-29 DIAGNOSIS — F331 Major depressive disorder, recurrent, moderate: Secondary | ICD-10-CM

## 2018-07-29 DIAGNOSIS — Z79899 Other long term (current) drug therapy: Secondary | ICD-10-CM | POA: Diagnosis not present

## 2018-07-29 NOTE — Patient Instructions (Signed)
1. Continue sertraline 50 mg daily 2. Return to clinic on 4/29 at 10 AM for 30 mins, video visit

## 2018-08-04 ENCOUNTER — Telehealth (HOSPITAL_COMMUNITY): Payer: Self-pay | Admitting: *Deleted

## 2018-08-04 NOTE — Telephone Encounter (Signed)
Dr Modesta Messing Patient called with concern of medication Zoloft. She "stated that as she was lying in bed last night it felt like time was speeding up really really fast & then she became restless unable to sleep # [phone]. Next visit  08/22/2018

## 2018-08-04 NOTE — Telephone Encounter (Signed)
Advise her to decrease sertraline to 25 mg daily (half dose) and see if it helps her symptoms.

## 2018-08-05 ENCOUNTER — Ambulatory Visit (HOSPITAL_COMMUNITY): Payer: Self-pay | Admitting: Psychiatry

## 2018-08-06 ENCOUNTER — Telehealth (HOSPITAL_COMMUNITY): Payer: Self-pay | Admitting: Psychiatry

## 2018-08-06 ENCOUNTER — Telehealth (HOSPITAL_COMMUNITY): Payer: Self-pay | Admitting: *Deleted

## 2018-08-06 NOTE — Telephone Encounter (Signed)
Discussed with the patient.   She states that she had VH of refrigerator open, or her son. It occurs a few times per week. Although she does not recollect when it started, she does not think sertraline is causing this issues. She feels "happy" otherwise, and things go well. She has occasional insomnia. She apologized that she was not sharing all of her concern at the visit as she was embarrassed by her symptoms.  - She is instructed to uptitrate sertraline back to 50 mg daily - Will have sooner appointment, 4/22 at 9 AM for 30 mins, video visit.

## 2018-08-06 NOTE — Telephone Encounter (Signed)
Dr Modesta Messing Patient called this AM . She is having problems still with the medication. She stated she thought her son had walked up behind her & said mom. But wasn't there. Please call # [phone]

## 2018-08-06 NOTE — Telephone Encounter (Signed)
Advise her to discontinue sertraline for now, if it started after taking sertraline. Also please assess if any safety risk (SI/HI).

## 2018-08-06 NOTE — Telephone Encounter (Signed)
Patient called back & spoke with provider Dr Modesta Messing

## 2018-08-06 NOTE — Telephone Encounter (Signed)
Dr Modesta Messing # [phone] patient needs to speak with you. She stated she had some VH(thinking the refrigerator door was open) & her memory she's becoming forgetful. Yesterday she couldn't get out of bed. Then she "says otherwise I feel great"

## 2018-08-06 NOTE — Telephone Encounter (Signed)
Left voice message to contact the office. Please ask the patient what her concern is, and relay the message to me.
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
## 2018-08-20 ENCOUNTER — Other Ambulatory Visit: Payer: Self-pay

## 2018-08-20 ENCOUNTER — Encounter (HOSPITAL_COMMUNITY): Payer: Self-pay | Admitting: Psychiatry

## 2018-08-20 ENCOUNTER — Ambulatory Visit (INDEPENDENT_AMBULATORY_CARE_PROVIDER_SITE_OTHER): Payer: Medicaid Other | Admitting: Psychiatry

## 2018-08-20 DIAGNOSIS — F331 Major depressive disorder, recurrent, moderate: Secondary | ICD-10-CM | POA: Insufficient documentation

## 2018-08-20 MED ORDER — SERTRALINE HCL 100 MG PO TABS: 100.0000 mg | ORAL_TABLET | Freq: Every day | ORAL | 0 refills | Status: DC

## 2018-08-20 MED ORDER — TRAZODONE HCL 50 MG PO TABS: ORAL_TABLET | ORAL | 0 refills | Status: DC

## 2018-08-20 NOTE — Patient Instructions (Signed)
1. Increase sertraline 100 mg daily  2. Start Trazodone 25-50 mg at night as needed for sleep 3. Next appointment: 5/21 at 2 PM for 30 mins

## 2018-08-27 ENCOUNTER — Ambulatory Visit (HOSPITAL_COMMUNITY): Payer: Medicaid Other | Admitting: Psychiatry

## 2018-09-02 ENCOUNTER — Ambulatory Visit (INDEPENDENT_AMBULATORY_CARE_PROVIDER_SITE_OTHER): Payer: Self-pay | Admitting: Nurse Practitioner

## 2018-09-08 ENCOUNTER — Other Ambulatory Visit: Payer: Self-pay

## 2018-09-08 ENCOUNTER — Ambulatory Visit (INDEPENDENT_AMBULATORY_CARE_PROVIDER_SITE_OTHER): Payer: Medicaid Other | Admitting: Licensed Clinical Social Worker

## 2018-09-08 DIAGNOSIS — F331 Major depressive disorder, recurrent, moderate: Secondary | ICD-10-CM

## 2018-09-09 ENCOUNTER — Encounter (HOSPITAL_COMMUNITY): Payer: Self-pay | Admitting: Licensed Clinical Social Worker
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
## 2018-09-16 NOTE — Progress Notes (Signed)
Virtual Visit via Video Note  I connected with Michelle Page on 09/18/18 at  2:00 PM EDT by a video enabled telemedicine application and verified that I am speaking with the correct person using two identifiers.   I discussed the limitations of evaluation and management by telemedicine and the availability of in person appointments. The patient expressed understanding and agreed to proceed.   I discussed the assessment and treatment plan with the patient. The patient was provided an opportunity to ask questions and all were answered. The patient agreed with the plan and demonstrated an understanding of the instructions.   The patient was advised to call back or seek an in-person evaluation if the symptoms worsen or if the condition fails to improve as anticipated.  I provided 25 minutes of non-face-to-face time during this encounter.   Norman Clay, MD    [facility] MD/PA/NP OP Progress Note  09/18/2018 2:32 PM Michelle Page  MRN:  [id number]  Chief Complaint:  Chief Complaint    Depression; Follow-up     HPI:  This is a follow-up appointment for depression.  She states that she has been feeling less irritable.  She states that there was a time she felt depressed when she thought her husband is cheating on her.  Although part of her meal that he is not cheating and the evidence shows that he is not cheating, she felt that he was cheating.  She regret after she told it to her husband.  She states that she tense to feel consumed by thoughts as she tends to be worried about many thing. She usually enjoys being with her children.  She notices that she has more patience with them.  She enjoys making volcanos, and built a house with magnet.  She states that she occasionally feels uncomfortable, having sensation of doing oral sex. It has been improving lately.  She does not know if there is any treatment for it.  She has middle insomnia.  Although she sleeps 4 hours after starting trazodone,  she still wakes up at night.  She feels fatigue.  She has difficulty in concentration.  She denies SI.  She does not have more appetite.  She feels anxious and tense at times.  She denies panic attacks.  She has VH of some visual changes. She denies AH.    200 lbs Wt Readings from Last 3 Encounters:  02/27/18 196 lb (88.9 kg)  01/22/18 214 lb 6.4 oz (97.3 kg)  01/16/18 214 lb 9.6 oz (97.3 kg)     Visit Diagnosis:    ICD-10-CM   1. MDD (major depressive disorder), recurrent episode, moderate ([place]) F33.1     Past Psychiatric History: Please see initial evaluation for full details. I have reviewed the history. No updates at this time.     Past Medical History:  Past Medical History:  Diagnosis Date  . Abnormal Pap smear    colpo/leep  . Anemia   . Anxiety   . Cancer [facility]) 2006   Skin  . Chlamydia infection   . Depression   . Fractures   . Gestational diabetes    metformin  . H/O candidiasis   . H/O varicella   . Headache(784.0)    migraines as a child   . Heart murmur   . High risk HPV infection   . Papanicolaou smear of cervix with positive high risk human papilloma virus (HPV) test 06/03/2017  . Pregnancy induced hypertension    1st & 2nd pregnancy  .  Pregnancy induced hypertension    1st & 2nd pregnancy   . Two vessel umbilical cord, antepartum 11/26/2011  . Vaginal Pap smear, abnormal   . Yeast infection     Past Surgical History:  Procedure Laterality Date  . APPENDECTOMY  08/14/2017  . COLPOSCOPY    . LAPAROSCOPIC APPENDECTOMY N/A 08/14/2017   Procedure: APPENDECTOMY LAPAROSCOPIC;  Surgeon: Erroll Luna, MD;  Location: [place];  Service: General;  Laterality: N/A;  . LEEP    . TUBAL LIGATION N/A 01/22/2018   Procedure: POST PARTUM TUBAL LIGATION;  Surgeon: Gwynne Edinger, MD;  Location: [place];  Service: Gynecology;  Laterality: N/A;  . WISDOM TOOTH EXTRACTION      Family Psychiatric History: Please see initial evaluation for full details. I  have reviewed the history. No updates at this time.     Family History:  Family History  Problem Relation Age of Onset  . Alcohol abuse Father   . Emphysema Father   . COPD Father   . Mental illness Father        PTSD  . Post-traumatic stress disorder Father   . Alcohol abuse Brother   . Stroke Brother   . Cancer Paternal Grandmother        skin cancer  . Multiple sclerosis Sister   . Depression Mother   . Bipolar disorder Mother   . Anxiety disorder Mother   . Asthma Son   . Heart disease Maternal Grandfather     Social History:  Social History   Socioeconomic History  . Marital status: Significant Other    Spouse name: todd  . Number of children: 6  . Years of education: 38  . Highest education level: Not on file  Occupational History  . Occupation: unemployed    Comment: caregiver  Social Needs  . Financial resource strain: Not hard at all  . Food insecurity:    Worry: Never true    Inability: Never true  . Transportation needs:    Medical: No    Non-medical: Not on file  Tobacco Use  . Smoking status: Former Smoker    Types: Cigarettes  . Smokeless tobacco: Never Used  . Tobacco comment: quit smoking in2007  Substance and Sexual Activity  . Alcohol use: No    Frequency: Never    Comment: occ before pregnancy  . Drug use: No  . Sexual activity: Yes    Birth control/protection: None, Surgical  Lifestyle  . Physical activity:    Days per week: Not on file    Minutes per session: Not on file  . Stress: To some extent  Relationships  . Social connections:    Talks on phone: Not on file    Gets together: Not on file    Attends religious service: Not on file    Active member of club or organization: Not on file    Attends meetings of clubs or organizations: Not on file    Relationship status: Not on file  Other Topics Concern  . Not on file  Social History Narrative   Lives in home with children   Dad has attached apartment   Lives with Sherren Mocha -  sig other   Cares for children and dad    Allergies: No Known Allergies  Metabolic Disorder Labs: No results found for: HGBA1C, MPG No results found for: PROLACTIN Lab Results  Component Value Date   CHOL 148 01/29/2017   TRIG 115 01/29/2017   HDL 45 (L) 01/29/2017  CHOLHDL 3.3 01/29/2017   LDLCALC 82 01/29/2017   Lab Results  Component Value Date   TSH 2.21 01/29/2017    Therapeutic Level Labs: No results found for: LITHIUM No results found for: VALPROATE No components found for:  CBMZ  Current Medications: Current Outpatient Medications  Medication Sig Dispense Refill  . ibuprofen (ADVIL,MOTRIN) 600 MG tablet Take 1 tablet (600 mg total) by mouth every 6 (six) hours. (Patient not taking: Reported on 07/29/2018) 30 tablet 0  . oxyCODONE-acetaminophen (PERCOCET/ROXICET) 5-325 MG tablet Take 1 tablet by mouth every 4 (four) hours as needed (pain scale 4-7). (Patient not taking: Reported on 02/27/2018) 30 tablet 0  . Prenatal Vit-Fe Fumarate-FA (PRENATAL VITAMIN PLUS LOW IRON) 27-1 MG TABS TAKE 1 TABLET BY MOUTH ONCE DAILY AT 12 NOON  0  . sertraline (ZOLOFT) 100 MG tablet Take 1.5 tablets (150 mg total) by mouth daily. 45 tablet 0  . sertraline (ZOLOFT) 50 MG tablet Take 50 mg by mouth daily.    . traZODone (DESYREL) 100 MG tablet 50-100 mg at night as needed for sleep 15 tablet 0   No current facility-administered medications for this visit.      Musculoskeletal: Strength & Muscle Tone: N/A Gait & Station: N/A Patient leans: N/A  Psychiatric Specialty Exam: Review of Systems  Psychiatric/Behavioral: Positive for depression. Negative for hallucinations, memory loss, substance abuse and suicidal ideas. The patient is nervous/anxious and has insomnia.   All other systems reviewed and are negative.   currently breastfeeding.There is no height or weight on file to calculate BMI.  General Appearance: Fairly Groomed  Eye Contact:  Good  Speech:  Clear and Coherent   Volume:  Normal  Mood:  "fine"  Affect:  Appropriate, Congruent and Restricted  Thought Process:  Coherent  Orientation:  Full (Time, Place, and Person)  Thought Content: Logical   Suicidal Thoughts:  No  Homicidal Thoughts:  No  Memory:  Immediate;   Good  Judgement:  Good  Insight:  Fair  Psychomotor Activity:  Normal  Concentration:  Concentration: Good and Attention Span: Good  Recall:  Good  Fund of Knowledge: Good  Language: Good  Akathisia:  No  Handed:  Right  AIMS (if indicated): not done  Assets:  Communication Skills Desire for Improvement  ADL's:  Intact  Cognition: WNL  Sleep:  Poor   Screenings: PHQ2-9     [facility] Initial Prenatal from 07/02/2017 in [place] from 05/29/2017 in [place] Visit from 01/29/2017 in [facility]  PHQ-2 Total Score  0  0  0  0  PHQ-9 Total Score  -  2  -  -       Assessment and Plan:  Michelle Page is a 43 y.o. year old female with a history of depression, breast feeding, bipolar disorder by history , who presents for follow up appointment for MDD (major depressive disorder), recurrent episode, moderate ([place])  # MDD, moderate, recurrent without psychotic features # r/o PTSD There has been gradual improvement in irritability and depressive symptoms since up titration of sertraline.  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  Will further up titration of sertraline to target depression.  Discussed potential risk of serotonin being excreted in breast milk, although benefit of treating  maternal depression outweighs risk of infant exposure to SSRI.  Discussed the same risk for trazodone.  Will uptitrate the dose of trazodone as needed for insomnia.  Noted  that although she was diagnosed with bipolar disorder as a child, she denies any hypomanic episode except 3 days of decreased need for sleep.  We will continue to monitor.  Discussed behavioral activation and sleep hygiene.   # VH She reports occasional change in her peripheral vision.  Differential of VH includes insomnia.  She does not have any other psychotic features to concern for psychosis.  She is recently seen by ophthalmologist with no significant finding.  Will continue to monitor.   Plan 1. Increase sertraline 150 mg daily  2. Increase Trazodone 50- 100 mg at night as needed for sleep 3. Next appointment: 6/18 at 3:50 for 30 mins, video -  TSH wnl checked by history  Past trials of medication:citalopram, bupropion, venlafaxine, carbamazepine, quetiapine (drowsiness), risperidone, Abilify  The patient demonstrates the following risk factors for suicide: Chronic risk factors for suicide include:psychiatric disorder ofdepression. Acute risk factorsfor suicide include: unemployment. Protective factorsfor this patient include: positive social support, responsibility to others (children, family), coping skills and hope for the future. Considering these factors, the overall suicide risk at this point appears to below. Patientisappropriate for outpatient follow up.  The duration of this appointment visit was 25 minutes of non face-to-face time with the patient.  Greater than 50% of this time was spent in counseling, explanation of  diagnosis, planning of further management, and coordination of care.  Norman Clay, MD 09/18/2018, 2:32 PM

## 2018-09-18 ENCOUNTER — Encounter (HOSPITAL_COMMUNITY): Payer: Self-pay | Admitting: Psychiatry

## 2018-09-18 ENCOUNTER — Other Ambulatory Visit: Payer: Self-pay

## 2018-09-18 ENCOUNTER — Ambulatory Visit (INDEPENDENT_AMBULATORY_CARE_PROVIDER_SITE_OTHER): Payer: Medicaid Other | Admitting: Psychiatry

## 2018-09-18 DIAGNOSIS — F331 Major depressive disorder, recurrent, moderate: Secondary | ICD-10-CM | POA: Diagnosis not present

## 2018-09-18 MED ORDER — TRAZODONE HCL 100 MG PO TABS: ORAL_TABLET | ORAL | 0 refills | Status: DC

## 2018-09-18 MED ORDER — SERTRALINE HCL 100 MG PO TABS: 150.0000 mg | ORAL_TABLET | Freq: Every day | ORAL | 0 refills | Status: DC

## 2018-09-18 NOTE — Patient Instructions (Addendum)
1. Increase sertraline 150 mg daily  2. Increase Trazodone 50- 100 mg at night as needed for sleep 3. Next appointment: 6/18 at 3:50

## 2018-09-29 ENCOUNTER — Other Ambulatory Visit: Payer: Self-pay

## 2018-09-29 ENCOUNTER — Ambulatory Visit (INDEPENDENT_AMBULATORY_CARE_PROVIDER_SITE_OTHER): Payer: Medicaid Other | Admitting: Licensed Clinical Social Worker

## 2018-09-29 DIAGNOSIS — F331 Major depressive disorder, recurrent, moderate: Secondary | ICD-10-CM

## 2018-09-30 ENCOUNTER — Encounter (HOSPITAL_COMMUNITY): Payer: Self-pay | Admitting: Licensed Clinical Social Worker

## 2018-09-30 NOTE — Progress Notes (Signed)
  Virtual Visit via Video Note  I connected with Michelle Page on 09/30/18 at  3:00 PM EDT by a video enabled telemedicine application and verified that I am speaking with the correct person using two identifiers.  Location: Patient: Home Provider: Office   I discussed the limitations of evaluation and management by telemedicine and the availability of in person appointments. The patient expressed understanding and agreed to proceed.  Participation Level: Active  Behavioral Response: CasualAlertAnxious  Type of Therapy: Individual Therapy  Treatment Goals addressed: Coping  Interventions: CBT and Solution Focused  Summary: Michelle Page is a 43 y.o. female who presents oriented x5 (person, place, situation, time, and object), casually dressed, appropriately groomed, average height, average weight, and cooperative to address mood. Patient has a history of medical treatment including headaches and heart murmur. Patient has minimal history of mental health treatment including medication management. Patient denies suicidal and homicidal ideations. Patient denies psychosis including auditory and visual hallucinations. Patient denies substance abuse. Patient is at low risk for lethality.  Physically: Patient's sleep has been good. She feels tired through the day. She noted that after noon she gets tried. Patient has difficulty with concentration. She has had some foot pain and headaches.  Spiritually/values: No issues identified.  Relationships: Patient is getting along with her family.  Emotionally/Mentally/Behavior: Patient's mood has been ok. She has been less irritable. Patient has experienced some anxiety. Patient was unable to identify triggers or thoughts for anxiety or mood. Patient agreed to begin to write down her thoughts, feelings, etc.   Patient is engaged in session. Patient responded well to interventions. Patient continues to meet criteria for MDD (major depressive  disorder), recurrent episode, moderate. Patient will continue in outpatient therapy due to being the least restrictive service to meet her needs. Patient made minimal progress on her goals.   Suicidal/Homicidal: Negativewithout intent/plan  Therapist Response: Therapist reviewed patient's thoughts and behaviors. Therapist utilized CBT to address mood. Therapist processed patient's thoughts to address mood. Therapist processed patient's thoughts to identify triggers for mood. Therapist assisted patient in identifying ways to manage mood including tracking her thoughts, feelings, etc.   Plan: Return again in  weeks.  Diagnosis: Axis I: Bipolar, mixed and MDD (major depressive disorder), recurrent episode, moderate    Axis II: No diagnosis  I discussed the assessment and treatment plan with the patient. The patient was provided an opportunity to ask questions and all were answered. The patient agreed with the plan and demonstrated an understanding of the instructions.   The patient was advised to call back or seek an in-person evaluation if the symptoms worsen or if the condition fails to improve as anticipated.  I provided 30 minutes of non-face-to-face time during this encounter.  Glori Bickers, LCSW 09/30/2018

## 2018-10-14 NOTE — Progress Notes (Signed)
Virtual Visit via Video Note  I connected with Michelle Page on 10/16/18 at  3:50 PM EDT by a video enabled telemedicine application and verified that I am speaking with the correct person using two identifiers.   I discussed the limitations of evaluation and management by telemedicine and the availability of in person appointments. The patient expressed understanding and agreed to proceed.     I discussed the assessment and treatment plan with the patient. The patient was provided an opportunity to ask questions and all were answered. The patient agreed with the plan and demonstrated an understanding of the instructions.   The patient was advised to call back or seek an in-person evaluation if the symptoms worsen or if the condition fails to improve as anticipated.  I provided 25 minutes of non-face-to-face time during this encounter.   Norman Clay, MD    [facility] MD/PA/NP OP Progress Note  10/16/2018 4:54 PM Michelle Page  MRN:  [id number]  Chief Complaint:  Chief Complaint    Depression; Follow-up     HPI:  This is a follow-up appointment for depression.  She states that she has been working on Recruitment consultant and gardening. She feels that she is more close to her children now that she feels less irritable. She enjoys working on garden with them. However, she feels frustrated; she has been working on ten projects and has not been able to complete anything. She is forgetful. She feels sad and wishes to feel better. She wishes that she should have asked help sooner, stating that she was told at the church that the only thing she needs is Jesus. She is now amenable to try any medication which would be helpful for the patient, stating that she has children to take care of. She has insomnia, although it slightly improved after up titration of trazodone.  She has mild anhedonia and fatigue.  She denies SI.  She feels anxious sometimes.  She also tends to feel anxious when she come  across with people; she states that she feels like she "have to think if people said something to me." She also has ear pressure when she felt that her husband is whispering in her ear (although he does not.) She also has VH of looking blue color, and sees things as if looking through camera filter. She reports decreased need for a few days with increased energy.    Visit Diagnosis:    ICD-10-CM   1. MDD (major depressive disorder), recurrent episode, moderate ([place])  F33.1     Past Psychiatric History: Please see initial evaluation for full details. I have reviewed the history. No updates at this time.     Past Medical History:  Past Medical History:  Diagnosis Date  . Abnormal Pap smear    colpo/leep  . Anemia   . Anxiety   . Cancer [facility]) 2006   Skin  . Chlamydia infection   . Depression   . Fractures   . Gestational diabetes    metformin  . H/O candidiasis   . H/O varicella   . Headache(784.0)    migraines as a child   . Heart murmur   . High risk HPV infection   . Papanicolaou smear of cervix with positive high risk human papilloma virus (HPV) test 06/03/2017  . Pregnancy induced hypertension    1st & 2nd pregnancy  . Pregnancy induced hypertension    1st & 2nd pregnancy   . Two vessel umbilical cord, antepartum 11/26/2011  .  Vaginal Pap smear, abnormal   . Yeast infection     Past Surgical History:  Procedure Laterality Date  . APPENDECTOMY  08/14/2017  . COLPOSCOPY    . LAPAROSCOPIC APPENDECTOMY N/A 08/14/2017   Procedure: APPENDECTOMY LAPAROSCOPIC;  Surgeon: Erroll Luna, MD;  Location: [place];  Service: General;  Laterality: N/A;  . LEEP    . TUBAL LIGATION N/A 01/22/2018   Procedure: POST PARTUM TUBAL LIGATION;  Surgeon: Gwynne Edinger, MD;  Location: [place];  Service: Gynecology;  Laterality: N/A;  . WISDOM TOOTH EXTRACTION      Family Psychiatric History: Please see initial evaluation for full details. I have reviewed the history. No updates  at this time.     Family History:  Family History  Problem Relation Age of Onset  . Alcohol abuse Father   . Emphysema Father   . COPD Father   . Mental illness Father        PTSD  . Post-traumatic stress disorder Father   . Alcohol abuse Brother   . Stroke Brother   . Cancer Paternal Grandmother        skin cancer  . Multiple sclerosis Sister   . Depression Mother   . Bipolar disorder Mother   . Anxiety disorder Mother   . Asthma Son   . Heart disease Maternal Grandfather     Social History:  Social History   Socioeconomic History  . Marital status: Significant Other    Spouse name: todd  . Number of children: 6  . Years of education: 52  . Highest education level: Not on file  Occupational History  . Occupation: unemployed    Comment: caregiver  Social Needs  . Financial resource strain: Not hard at all  . Food insecurity    Worry: Never true    Inability: Never true  . Transportation needs    Medical: No    Non-medical: Not on file  Tobacco Use  . Smoking status: Former Smoker    Types: Cigarettes  . Smokeless tobacco: Never Used  . Tobacco comment: quit smoking in2007  Substance and Sexual Activity  . Alcohol use: No    Frequency: Never    Comment: occ before pregnancy  . Drug use: No  . Sexual activity: Yes    Birth control/protection: None, Surgical  Lifestyle  . Physical activity    Days per week: Not on file    Minutes per session: Not on file  . Stress: To some extent  Relationships  . Social Herbalist on phone: Not on file    Gets together: Not on file    Attends religious service: Not on file    Active member of club or organization: Not on file    Attends meetings of clubs or organizations: Not on file    Relationship status: Not on file  Other Topics Concern  . Not on file  Social History Narrative   Lives in home with children   Dad has attached apartment   Lives with Sherren Mocha - sig other   Cares for children and dad     Allergies: No Known Allergies  Metabolic Disorder Labs: No results found for: HGBA1C, MPG No results found for: PROLACTIN Lab Results  Component Value Date   CHOL 148 01/29/2017   TRIG 115 01/29/2017   HDL 45 (L) 01/29/2017   CHOLHDL 3.3 01/29/2017   [place] 82 01/29/2017   Lab Results  Component Value Date   TSH  2.21 01/29/2017    Therapeutic Level Labs: No results found for: LITHIUM No results found for: VALPROATE No components found for:  CBMZ  Current Medications: Current Outpatient Medications  Medication Sig Dispense Refill  . ARIPiprazole (ABILIFY) 2 MG tablet Take 1 tablet (2 mg total) by mouth daily. 30 tablet 0  . ibuprofen (ADVIL,MOTRIN) 600 MG tablet Take 1 tablet (600 mg total) by mouth every 6 (six) hours. (Patient not taking: Reported on 07/29/2018) 30 tablet 0  . oxyCODONE-acetaminophen (PERCOCET/ROXICET) 5-325 MG tablet Take 1 tablet by mouth every 4 (four) hours as needed (pain scale 4-7). (Patient not taking: Reported on 02/27/2018) 30 tablet 0  . Prenatal Vit-Fe Fumarate-FA (PRENATAL VITAMIN PLUS LOW IRON) 27-1 MG TABS TAKE 1 TABLET BY MOUTH ONCE DAILY AT 12 NOON  0  . sertraline (ZOLOFT) 100 MG tablet Take 1.5 tablets (150 mg total) by mouth daily. 45 tablet 0  . traZODone (DESYREL) 100 MG tablet Take 1 tablet (100 mg total) by mouth at bedtime. 30 tablet 0   No current facility-administered medications for this visit.      Musculoskeletal: Strength & Muscle Tone: N/A Gait & Station: N/A Patient leans: N/A  Psychiatric Specialty Exam: Review of Systems  Psychiatric/Behavioral: Positive for depression and hallucinations. Negative for memory loss, substance abuse and suicidal ideas. The patient is nervous/anxious and has insomnia.   All other systems reviewed and are negative.   currently breastfeeding.There is no height or weight on file to calculate BMI.  General Appearance: Fairly Groomed  Eye Contact:  Good  Speech:  Clear and Coherent   Volume:  Normal  Mood:  Depressed  Affect:  Appropriate, Congruent and Restricted  Thought Process:  Coherent  Orientation:  Full (Time, Place, and Person)  Thought Content: Logical   Suicidal Thoughts:  No  Homicidal Thoughts:  No  Memory:  Immediate;   Good  Judgement:  Good  Insight:  Fair  Psychomotor Activity:  Normal  Concentration:  Concentration: Good and Attention Span: Good  Recall:  Good  Fund of Knowledge: Good  Language: Good  Akathisia:  No  Handed:  Right  AIMS (if indicated): not done  Assets:  Communication Skills Desire for Improvement  ADL's:  Intact  Cognition: WNL  Sleep:  Poor   Screenings: PHQ2-9     [facility] Initial Prenatal from 07/02/2017 in [place] from 05/29/2017 in [place] Office Visit from 01/29/2017 in [facility]  PHQ-2 Total Score  0  0  0  0  PHQ-9 Total Score  -  2  -  -       Assessment and Plan:  ,Keaisha NORIKO MACARI is a 43 y.o. year old female with a history of depression, breast feeding, bipolar disorder by history, who presents for follow up appointment for depression.   # MDD, moderate, recurrent without psychotic features # r/o PTSD She continues to report sadness, difficulty in concentration decreased need for sleep for 3 days and hallucinations, although there has been improvement in irritability since up titration of sertraline.  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  She also experienced her ex-husband, who  suddenly left the family.  Will start Abilify as adjunctive treatment for depression.  Will continue current dose of sertraline to target depression.  Discussed potential risk of these medication being excreted in breast milk, although  benefit of treating maternal depression outweighs risk of implant exposure to medication. Discussed  the same risk for trazodone. Noted that although she was diagnosed with bipolar disorder as a child, she denies any hypomanic episode except 3 days of decreased need for sleep. Will continue to monitor.   Plan I have reviewed and updated plans as below 1. Continue sertraline 150 mg daily  2. Start Abilify 2 mg daily  3. Continue Trazodone 50- 100 mg at night as needed for sleep 4. Next appointment: 7/16 at 2:20 for 30 mins, video -TSH wnl checked by history  Past trials of medication:citalopram, bupropion, venlafaxine, carbamazepine, quetiapine (drowsiness), risperidone, Abilify  The patient demonstrates the following risk factors for suicide: Chronic risk factors for suicide include:psychiatric disorder ofdepression. Acute risk factorsfor suicide include: unemployment. Protective factorsfor this patient include: positive social support, responsibility to others (children, family), coping skills and hope for the future. Considering these factors, the overall suicide risk at this point appears to below. Patientisappropriate for outpatient follow up.   Norman Clay, MD 10/16/2018, 4:54 PM

## 2018-10-16 ENCOUNTER — Other Ambulatory Visit: Payer: Self-pay

## 2018-10-16 ENCOUNTER — Encounter (HOSPITAL_COMMUNITY): Payer: Self-pay | Admitting: Psychiatry

## 2018-10-16 ENCOUNTER — Ambulatory Visit (INDEPENDENT_AMBULATORY_CARE_PROVIDER_SITE_OTHER): Payer: Medicaid Other | Admitting: Psychiatry

## 2018-10-16 DIAGNOSIS — F331 Major depressive disorder, recurrent, moderate: Secondary | ICD-10-CM | POA: Diagnosis not present

## 2018-10-16 MED ORDER — ARIPIPRAZOLE 2 MG PO TABS: 2.0000 mg | ORAL_TABLET | Freq: Every day | ORAL | 0 refills | Status: DC

## 2018-10-16 MED ORDER — TRAZODONE HCL 100 MG PO TABS: 100.0000 mg | ORAL_TABLET | Freq: Every day | ORAL | 0 refills | Status: DC

## 2018-10-16 MED ORDER — SERTRALINE HCL 100 MG PO TABS: 150.0000 mg | ORAL_TABLET | Freq: Every day | ORAL | 0 refills | Status: DC

## 2018-10-16 NOTE — Patient Instructions (Signed)
1. Continue sertraline 150 mg daily  2. Start Abilify 2 mg daily  3. Continue Trazodone 50- 100 mg at night as needed for sleep 4. Next appointment: 7/16 at 2:20

## 2018-10-20 ENCOUNTER — Other Ambulatory Visit: Payer: Self-pay

## 2018-10-20 ENCOUNTER — Encounter (HOSPITAL_COMMUNITY): Payer: Self-pay | Admitting: Licensed Clinical Social Worker

## 2018-10-20 ENCOUNTER — Ambulatory Visit (INDEPENDENT_AMBULATORY_CARE_PROVIDER_SITE_OTHER): Payer: Medicaid Other | Admitting: Licensed Clinical Social Worker

## 2018-10-20 DIAGNOSIS — F331 Major depressive disorder, recurrent, moderate: Secondary | ICD-10-CM | POA: Diagnosis not present

## 2018-10-20 NOTE — Progress Notes (Signed)
  Virtual Visit via Video Note  I connected with Michelle Page on 10/20/18 at  2:00 PM EDT by a video enabled telemedicine application and verified that I am speaking with the correct person using two identifiers.  Location: Patient: Home Provider: Office   I discussed the limitations of evaluation and management by telemedicine and the availability of in person appointments. The patient expressed understanding and agreed to proceed.  Participation Level: Active  Behavioral Response: CasualAlertAnxious  Type of Therapy: Individual Therapy  Treatment Goals addressed: Coping  Interventions: CBT and Solution Focused  Summary: Michelle Page is a 43 y.o. female who presents oriented x5 (person, place, situation, time, and object), casually dressed, appropriately groomed, average height, average weight, and cooperative to address mood. Patient has a history of medical treatment including headaches and heart murmur. Patient has minimal history of mental health treatment including medication management. Patient denies suicidal and homicidal ideations. Patient denies psychosis including auditory and visual hallucinations. Patient denies substance abuse. Patient is at low risk for lethality.  Physically: Patient has had a headache for two days but it was gone today. She has been active by working in the yard. Patient continues to be forgetful.  Spiritually/values: No issues identified.  Relationships: Patient is getting along with her family.  Emotionally/Mentally/Behavior: Patient's mood has been ok. Patient is less irritable than previously. She struggles with making decisions. She can spend hours and weeks trying to make decisions. Patient understood that she needs to set limits for herself and set boundaries to find balance with making a decisions.   Patient is engaged in session. Patient responded well to interventions. Patient continues to meet criteria for MDD (major depressive  disorder), recurrent episode, moderate. Patient will continue in outpatient therapy due to being the least restrictive service to meet her needs. Patient made minimal progress on her goals.   Suicidal/Homicidal: Negativewithout intent/plan  Therapist Response: Therapist reviewed patient's thoughts and behaviors. Therapist utilized CBT to address mood. Therapist processed patient's thoughts to address mood. Therapist processed patient's thoughts to identify triggers for mood. Therapist discussed with patient how to make decisions without getting exhausted.   Plan: Return again in  weeks.  Diagnosis: Axis I: Bipolar, mixed and MDD (major depressive disorder), recurrent episode, moderate    Axis II: No diagnosis  I discussed the assessment and treatment plan with the patient. The patient was provided an opportunity to ask questions and all were answered. The patient agreed with the plan and demonstrated an understanding of the instructions.   The patient was advised to call back or seek an in-person evaluation if the symptoms worsen or if the condition fails to improve as anticipated.  I provided 40 minutes of non-face-to-face time during this encounter.  Glori Bickers, LCSW 10/20/2018

## 2018-11-11 NOTE — Progress Notes (Addendum)
Virtual Visit via Video Note  I connected with Michelle Page on 11/13/18 at  2:20 PM EDT by a video enabled telemedicine application and verified that I am speaking with the correct person using two identifiers.   I discussed the limitations of evaluation and management by telemedicine and the availability of in person appointments. The patient expressed understanding and agreed to proceed.    I discussed the assessment and treatment plan with the patient. The patient was provided an opportunity to ask questions and all were answered. The patient agreed with the plan and demonstrated an understanding of the instructions.   The patient was advised to call back or seek an in-person evaluation if the symptoms worsen or if the condition fails to improve as anticipated.  I provided 15 minutes of non-face-to-face time during this encounter.   Norman Clay, MD   [facility] MD/PA/NP OP Progress Note  11/13/2018 2:45 PM Michelle Page  MRN:  [id number]  Chief Complaint:  Chief Complaint    Depression; Follow-up     HPI:  This is a follow-up appointment for depression.  She states that she has not noticed any side effect from the medication.  She continues to "look twice" due to [facility] She states that she thought she saw something next to tree, although there is nothing. She also has AH of feeling that her husband is whispering. She also has "constant fear of being cheated on," although her husband never did it before. She states that she had a history in previous relationship.  She has middle insomnia.  She feels fatigue.  She feels mildly depressed.  She has difficulty in concentration.  She denies SI.  She feels anxious and tense. She denies panic attacks. She feels irritable at times. She denies decreased need for sleep or euphoria. Although she bought a puppy for her daughter, she feels content about it.   Visit Diagnosis:    ICD-10-CM   1. MDD (major depressive disorder), recurrent  episode, moderate ([place])  F33.1     Past Psychiatric History: Please see initial evaluation for full details. I have reviewed the history. No updates at this time.     Past Medical History:  Past Medical History:  Diagnosis Date  . Abnormal Pap smear    colpo/leep  . Anemia   . Anxiety   . Cancer [facility]) 2006   Skin  . Chlamydia infection   . Depression   . Fractures   . Gestational diabetes    metformin  . H/O candidiasis   . H/O varicella   . Headache(784.0)    migraines as a child   . Heart murmur   . High risk HPV infection   . Papanicolaou smear of cervix with positive high risk human papilloma virus (HPV) test 06/03/2017  . Pregnancy induced hypertension    1st & 2nd pregnancy  . Pregnancy induced hypertension    1st & 2nd pregnancy   . Two vessel umbilical cord, antepartum 11/26/2011  . Vaginal Pap smear, abnormal   . Yeast infection     Past Surgical History:  Procedure Laterality Date  . APPENDECTOMY  08/14/2017  . COLPOSCOPY    . LAPAROSCOPIC APPENDECTOMY N/A 08/14/2017   Procedure: APPENDECTOMY LAPAROSCOPIC;  Surgeon: Erroll Luna, MD;  Location: [place];  Service: General;  Laterality: N/A;  . LEEP    . TUBAL LIGATION N/A 01/22/2018   Procedure: POST PARTUM TUBAL LIGATION;  Surgeon: Gwynne Edinger, MD;  Location: [place];  Service: Gynecology;  Laterality: N/A;  . WISDOM TOOTH EXTRACTION      Family Psychiatric History: Please see initial evaluation for full details. I have reviewed the history. No updates at this time.     Family History:  Family History  Problem Relation Age of Onset  . Alcohol abuse Father   . Emphysema Father   . COPD Father   . Mental illness Father        PTSD  . Post-traumatic stress disorder Father   . Alcohol abuse Brother   . Stroke Brother   . Cancer Paternal Grandmother        skin cancer  . Multiple sclerosis Sister   . Depression Mother   . Bipolar disorder Mother   . Anxiety disorder Mother   .  Asthma Son   . Heart disease Maternal Grandfather     Social History:  Social History   Socioeconomic History  . Marital status: Significant Other    Spouse name: todd  . Number of children: 6  . Years of education: 109  . Highest education level: Not on file  Occupational History  . Occupation: unemployed    Comment: caregiver  Social Needs  . Financial resource strain: Not hard at all  . Food insecurity    Worry: Never true    Inability: Never true  . Transportation needs    Medical: No    Non-medical: Not on file  Tobacco Use  . Smoking status: Former Smoker    Types: Cigarettes  . Smokeless tobacco: Never Used  . Tobacco comment: quit smoking in2007  Substance and Sexual Activity  . Alcohol use: No    Frequency: Never    Comment: occ before pregnancy  . Drug use: No  . Sexual activity: Yes    Birth control/protection: None, Surgical  Lifestyle  . Physical activity    Days per week: Not on file    Minutes per session: Not on file  . Stress: To some extent  Relationships  . Social Herbalist on phone: Not on file    Gets together: Not on file    Attends religious service: Not on file    Active member of club or organization: Not on file    Attends meetings of clubs or organizations: Not on file    Relationship status: Not on file  Other Topics Concern  . Not on file  Social History Narrative   Lives in home with children   Dad has attached apartment   Lives with Sherren Mocha - sig other   Cares for children and dad    Allergies: No Known Allergies  Metabolic Disorder Labs: No results found for: HGBA1C, MPG No results found for: PROLACTIN Lab Results  Component Value Date   CHOL 148 01/29/2017   TRIG 115 01/29/2017   HDL 45 (L) 01/29/2017   CHOLHDL 3.3 01/29/2017   [place] 82 01/29/2017   Lab Results  Component Value Date   TSH 2.21 01/29/2017    Therapeutic Level Labs: No results found for: LITHIUM No results found for: VALPROATE No  components found for:  CBMZ  Current Medications: Current Outpatient Medications  Medication Sig Dispense Refill  . ARIPiprazole (ABILIFY) 2 MG tablet Take 1 tablet (2 mg total) by mouth daily. 30 tablet 0  . ibuprofen (ADVIL,MOTRIN) 600 MG tablet Take 1 tablet (600 mg total) by mouth every 6 (six) hours. (Patient not taking: Reported on 07/29/2018) 30 tablet 0  . oxyCODONE-acetaminophen (PERCOCET/ROXICET)  5-325 MG tablet Take 1 tablet by mouth every 4 (four) hours as needed (pain scale 4-7). (Patient not taking: Reported on 02/27/2018) 30 tablet 0  . Prenatal Vit-Fe Fumarate-FA (PRENATAL VITAMIN PLUS LOW IRON) 27-1 MG TABS TAKE 1 TABLET BY MOUTH ONCE DAILY AT 12 NOON  0  . sertraline (ZOLOFT) 100 MG tablet Take 1.5 tablets (150 mg total) by mouth daily. 45 tablet 0  . traZODone (DESYREL) 100 MG tablet Take 1 tablet (100 mg total) by mouth at bedtime. 30 tablet 0   No current facility-administered medications for this visit.      Musculoskeletal: Strength & Muscle Tone: N/A Gait & Station: N/A Patient leans: N/A  Psychiatric Specialty Exam: Review of Systems  Psychiatric/Behavioral: Positive for depression. Negative for hallucinations, memory loss, substance abuse and suicidal ideas. The patient is nervous/anxious and has insomnia.   All other systems reviewed and are negative.   currently breastfeeding.There is no height or weight on file to calculate BMI.  General Appearance: Fairly Groomed  Eye Contact:  Good  Speech:  Clear and Coherent  Volume:  Normal  Mood:  Anxious  Affect:  Appropriate, Congruent and calm  Thought Process:  Coherent  Orientation:  Full (Time, Place, and Person)  Thought Content: Logical   Suicidal Thoughts:  No  Homicidal Thoughts:  No  Memory:  Immediate;   Good  Judgement:  Good  Insight:  Fair  Psychomotor Activity:  Normal  Concentration:  Concentration: Good and Attention Span: Good  Recall:  Good  Fund of Knowledge: Good  Language: Good   Akathisia:  No  Handed:  Right  AIMS (if indicated): not done  Assets:  Communication Skills Desire for Improvement  ADL's:  Intact  Cognition: WNL  Sleep:  Fair   Screenings: PHQ2-9     N[facility] Initial Prenatal from 07/02/2017 in [place] from 05/29/2017 in [place] Office Visit from 01/29/2017 in [facility]  PHQ-2 Total Score  0  0  0  0  PHQ-9 Total Score  -  2  -  -       Assessment and Plan:  Michelle Page is a 43 y.o. year old female with a history of depression, breast feeding, bipolar disorder by history, who presents for follow up appointment for depression.   # MDD, moderate, recurrent without psychotic features # r/o PTSD Although there is slight improvement in her mood symptoms, she continues to report irritability, anxiety and vague AH/VH and ego dystonic doubt about her husband's infidelity (with history of her ex-husband left suddenly in the past).  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  We uptitrate Abilify as adjunctive treatment for depression.  Discussed risk of EPS and metabolic side effect.  Continue sertraline to target depression.  Discussed potential risk of psychotropics being excreted in breast milk, although the benefit of treating her mental health outweighs risk of exposure to infant.  Will continue trazodone as needed for insomnia.  Noted that although she was diagnosed with bipolar disorder as a child,she denies any hypomanic episode except 3 days of decreased need for sleep. Will continue to monitor.    Plan I have reviewed and updated plans as below 1. Continue sertraline 150 mg daily  2. Increase Abilify 5 mg daily  3.ContinueTrazodone50- 100mg  at night as needed for sleep 4. Next appointment:8/25 at 3:20 for 30 mins, video -TSH  wnl checked by history - front desk to contact for therapy follow  up  Past trials of medication:citalopram, bupropion, venlafaxine, carbamazepine, quetiapine (drowsiness), risperidone, Abilify  The patient demonstrates the following risk factors for suicide: Chronic risk factors for suicide include:psychiatric disorder ofdepression. Acute risk factorsfor suicide include: unemployment. Protective factorsfor this patient include: positive social support, responsibility to others (children, family), coping skills and hope for the future. Considering these factors, the overall suicide risk at this point appears to below. Patientisappropriate for outpatient follow up.  Norman Clay, MD 11/13/2018, 2:45 PM

## 2018-11-13 ENCOUNTER — Encounter (HOSPITAL_COMMUNITY): Payer: Self-pay | Admitting: Psychiatry

## 2018-11-13 ENCOUNTER — Other Ambulatory Visit: Payer: Self-pay

## 2018-11-13 ENCOUNTER — Ambulatory Visit (INDEPENDENT_AMBULATORY_CARE_PROVIDER_SITE_OTHER): Payer: Medicaid Other | Admitting: Psychiatry

## 2018-11-13 DIAGNOSIS — F331 Major depressive disorder, recurrent, moderate: Secondary | ICD-10-CM

## 2018-11-13 MED ORDER — TRAZODONE HCL 100 MG PO TABS: 100.0000 mg | ORAL_TABLET | Freq: Every day | ORAL | 0 refills | Status: DC

## 2018-11-13 MED ORDER — ARIPIPRAZOLE 5 MG PO TABS: 5.0000 mg | ORAL_TABLET | Freq: Every day | ORAL | 1 refills | Status: DC

## 2018-11-13 MED ORDER — SERTRALINE HCL 100 MG PO TABS: 150.0000 mg | ORAL_TABLET | Freq: Every day | ORAL | 0 refills | Status: DC

## 2018-11-13 NOTE — Patient Instructions (Signed)
1. Continue sertraline 150 mg daily  2. Increase Abilify 5 mg daily  3.ContinueTrazodone50- 100mg  at night as needed for sleep 4. Next appointment:8/25 at 3:20

## 2018-11-26 ENCOUNTER — Ambulatory Visit (INDEPENDENT_AMBULATORY_CARE_PROVIDER_SITE_OTHER): Payer: Medicaid Other | Admitting: Adult Health

## 2018-11-26 ENCOUNTER — Other Ambulatory Visit: Payer: Self-pay

## 2018-11-26 ENCOUNTER — Encounter: Payer: Self-pay | Admitting: Adult Health

## 2018-11-26 ENCOUNTER — Other Ambulatory Visit (HOSPITAL_COMMUNITY)
Admission: RE | Admit: 2018-11-26 | Discharge: 2018-11-26 | Disposition: A | Discharge status: Home / Self Care | Payer: Medicaid Other | Source: Ambulatory Visit | Attending: Adult Health | Admitting: Adult Health

## 2018-11-26 VITALS — BP 108/67 | HR 73 | Ht 63.5 in | Wt 192.0 lb

## 2018-11-26 DIAGNOSIS — Z1211 Encounter for screening for malignant neoplasm of colon: Secondary | ICD-10-CM | POA: Diagnosis not present

## 2018-11-26 DIAGNOSIS — Z124 Encounter for screening for malignant neoplasm of cervix: Secondary | ICD-10-CM | POA: Insufficient documentation

## 2018-11-26 DIAGNOSIS — Z1212 Encounter for screening for malignant neoplasm of rectum: Secondary | ICD-10-CM | POA: Diagnosis not present

## 2018-11-26 DIAGNOSIS — Z0000 Encounter for general adult medical examination without abnormal findings: Secondary | ICD-10-CM

## 2018-11-26 DIAGNOSIS — Z8619 Personal history of other infectious and parasitic diseases: Secondary | ICD-10-CM

## 2018-11-26 DIAGNOSIS — Z01419 Encounter for gynecological examination (general) (routine) without abnormal findings: Secondary | ICD-10-CM

## 2018-11-26 DIAGNOSIS — K649 Unspecified hemorrhoids: Secondary | ICD-10-CM

## 2018-11-26 LAB — HEMOCCULT GUIAC POC 1CARD (OFFICE): Fecal Occult Blood, POC: NEGATIVE

## 2018-11-26 MED ORDER — HYDROCORT-PRAMOXINE (PERIANAL) 1-1 % EX FOAM: 1.0000 | Freq: Two times a day (BID) | CUTANEOUS | 1 refills | Status: DC

## 2018-11-26 NOTE — Progress Notes (Signed)
Patient ID: Michelle Page, female   DOB: [birth year], 43 y.o.   MRN: [id number] History of Present Illness: Michelle Page is a 43 year old white female, single, [id number], in for well woman gyn exam and pap,her pap was +HPV 05/29/17. She has had a tubal ligation, and is still breastfeeding a 24 month old.  PCP is Dr Karie Kirks.    Current Medications, Allergies, Past Medical History, Past Surgical History, Family History and Social History were reviewed in [employer] record.     Review of Systems: Patient denies any headaches, hearing loss, fatigue, blurred vision, shortness of breath, chest pain, abdominal pain, problems with bowel movements, or intercourse. No joint pain or mood swings. Has rectal itching at times, sees blood in stool sometimes. Has trouble with urination and has appt with urologist, she doe not remember his name.      Physical Exam:BP 108/67 (BP Location: Left Arm, Patient Position: Sitting, Cuff Size: Normal)   Pulse 73   Ht 5' 3.5" (1.613 m)   Wt 192 lb (87.1 kg)   LMP 11/25/2018   Breastfeeding Yes   BMI 33.48 kg/m  General:  Well developed, well nourished, no acute distress Skin:  Warm and dry Neck:  Midline trachea, normal thyroid, good ROM, no lymphadenopathy Lungs; Clear to auscultation bilaterally Breast:  No dominant palpable mass, retraction, or nipple discharge Cardiovascular: Regular rate and rhythm Abdomen:  Soft, non tender, no hepatosplenomegaly Pelvic:  External genitalia is normal in appearance, no lesions.  The vagina is normal in appearance.+period blood in vault. Urethra has no lesions or masses. The cervix is bulbous.Pap with HPV, reflex 16/18 typing performed.  Uterus is felt to be normal size, shape, and contour.  No adnexal masses or tenderness noted.Bladder is non tender, no masses felt. Rectal: Good sphincter tone, no polyps, + hemorrhoids felt.  Hemoccult negative. Extremities/musculoskeletal:  No swelling or varicosities  noted, no clubbing or cyanosis Psych:  No mood changes, alert and cooperative,seems happy Fall risk is low PHQ 9 score is 7, is on meds and sees BH, denies being suicidal. Examination chaperoned by Levy Pupa LPN.   Impression: 1. Encounter for gynecological examination with Papanicolaou smear of cervix   2. Routine cervical smear   3. History of HPV infection   4. Screening for colorectal cancer   5. Hemorrhoids, unspecified hemorrhoid type       Plan: Pap with HPV 16/18 genotyping reflex sent Physical in 1 year Pap in 3 years if normal  Will rx proctofoam Meds ordered this encounter  Medications  . hydrocortisone-pramoxine (PROCTOFOAM-HC) rectal foam    Sig: Place 1 applicator rectally 2 (two) times daily.    Dispense:  10 g    Refill:  1    Order Specific Question:   Supervising Provider    Answer:   Tania Ade H [[id number]]

## 2018-11-27 ENCOUNTER — Ambulatory Visit (HOSPITAL_COMMUNITY): Payer: Medicaid Other | Admitting: Licensed Clinical Social Worker

## 2018-12-01 ENCOUNTER — Telehealth: Payer: Self-pay | Admitting: Adult Health

## 2018-12-01 ENCOUNTER — Telehealth: Payer: Self-pay | Admitting: *Deleted

## 2018-12-01 LAB — CYTOLOGY - PAP
Chlamydia: NEGATIVE
Diagnosis: UNDETERMINED — AB
HPV: NOT DETECTED
Neisseria Gonorrhea: NEGATIVE

## 2018-12-01 MED ORDER — HYDROCORT-PRAMOXINE (PERIANAL) 1-1 % EX FOAM: 1.0000 | Freq: Two times a day (BID) | CUTANEOUS | 1 refills | Status: DC

## 2018-12-01 NOTE — Telephone Encounter (Signed)
Pt advised can try OTC Pramoxine and see if that helps. If pt don't get relief, advised to let us know. Pt voiced understanding. [place]

## 2018-12-01 NOTE — Telephone Encounter (Signed)
Pap shows atypical cells but is negative for HPV this year, was +HPV in the past, so will need follow up in 1 year for HPV testing per ASCCP 2019 guidelines,pt aware

## 2018-12-11 ENCOUNTER — Inpatient Hospital Stay
Admission: RE | Admit: 2018-12-11 | Discharge: 2018-12-11 | Disposition: A | Discharge status: Home / Self Care | Payer: Medicaid Other | Source: Ambulatory Visit

## 2018-12-17 NOTE — Progress Notes (Signed)
Virtual Visit via Video Note  I connected with Michelle Page on 12/23/18 at  3:20 PM EDT by a video enabled telemedicine application and verified that I am speaking with the correct person using two identifiers.   I discussed the limitations of evaluation and management by telemedicine and the availability of in person appointments. The patient expressed understanding and agreed to proceed.    I discussed the assessment and treatment plan with the patient. The patient was provided an opportunity to ask questions and all were answered. The patient agreed with the plan and demonstrated an understanding of the instructions.   The patient was advised to call back or seek an in-person evaluation if the symptoms worsen or if the condition fails to improve as anticipated.  I provided 15 minutes of non-face-to-face time during this encounter.   Norman Clay, MD     [facility] MD/PA/NP OP Progress Note  12/23/2018 4:45 PM Michelle Page  MRN:  [id number]  Chief Complaint:  Chief Complaint    Depression; Follow-up     HPI:  This is a follow-up appointment for depression and PTSD.  She states that her father at home passed on 2022-12-01. Although it is too much to handle for the patient, she believes she has been doing fine. She states that she tends to have "grandiose thoughts," which she "knows it's not true," although she is unable to elaborate it. She has "dejavue" and cannot describe it as "it even doesn't make sense." She denies any violent thoughts, She occasionally has to double check about her family to make sure they are safe. She is unsure if her symptoms worsens after uptitration of Abilify, or due to recent loss of her father. She has fair sleep.  She feels depressed.  She feels fatigue.  She has difficulty in concentration.  She denies SI.  She occasionally feels anxious sometimes.  She has had at least a few panic attacks.  She denies decreased need for sleep or euphoria.     Visit Diagnosis:    ICD-10-CM   1. MDD (major depressive disorder), recurrent episode, moderate ([place])  F33.1     Past Psychiatric History: Please see initial evaluation for full details. I have reviewed the history. No updates at this time.     Past Medical History:  Past Medical History:  Diagnosis Date  . Abnormal Pap smear    colpo/leep  . Anemia   . Anxiety   . Cancer [facility]) 2006   Skin  . Chlamydia infection   . Depression   . Fractures   . Gestational diabetes    metformin  . H/O candidiasis   . H/O varicella   . Headache(784.0)    migraines as a child   . Heart murmur   . High risk HPV infection   . Papanicolaou smear of cervix with positive high risk human papilloma virus (HPV) test 06/03/2017  . Pregnancy induced hypertension    1st & 2nd pregnancy  . Pregnancy induced hypertension    1st & 2nd pregnancy   . Two vessel umbilical cord, antepartum Dec 01, 2011  . Vaginal Pap smear, abnormal   . Yeast infection     Past Surgical History:  Procedure Laterality Date  . APPENDECTOMY  08/14/2017  . COLPOSCOPY    . LAPAROSCOPIC APPENDECTOMY N/A 08/14/2017   Procedure: APPENDECTOMY LAPAROSCOPIC;  Surgeon: Erroll Luna, MD;  Location: [place];  Service: General;  Laterality: N/A;  . LEEP    . TUBAL LIGATION N/A  01/22/2018   Procedure: POST PARTUM TUBAL LIGATION;  Surgeon: Gwynne Edinger, MD;  Location: [place];  Service: Gynecology;  Laterality: N/A;  . WISDOM TOOTH EXTRACTION      Family Psychiatric History: Please see initial evaluation for full details. I have reviewed the history. No updates at this time.     Family History:  Family History  Problem Relation Age of Onset  . Alcohol abuse Father   . Emphysema Father   . COPD Father   . Mental illness Father        PTSD  . Post-traumatic stress disorder Father   . Alcohol abuse Brother   . Stroke Brother   . Cancer Paternal Grandmother        skin cancer  . Multiple sclerosis Sister    . Depression Mother   . Bipolar disorder Mother   . Anxiety disorder Mother   . Asthma Son   . Heart disease Maternal Grandfather     Social History:  Social History   Socioeconomic History  . Marital status: Significant Other    Spouse name: todd  . Number of children: 6  . Years of education: 1  . Highest education level: Not on file  Occupational History  . Occupation: unemployed    Comment: caregiver  Social Needs  . Financial resource strain: Not hard at all  . Food insecurity    Worry: Never true    Inability: Never true  . Transportation needs    Medical: No    Non-medical: Not on file  Tobacco Use  . Smoking status: Former Smoker    Types: Cigarettes  . Smokeless tobacco: Never Used  . Tobacco comment: quit smoking in2007  Substance and Sexual Activity  . Alcohol use: No    Frequency: Never    Comment: occ before pregnancy  . Drug use: No  . Sexual activity: Yes    Birth control/protection: Surgical    Comment: tubal  Lifestyle  . Physical activity    Days per week: Not on file    Minutes per session: Not on file  . Stress: To some extent  Relationships  . Social Herbalist on phone: Not on file    Gets together: Not on file    Attends religious service: Not on file    Active member of club or organization: Not on file    Attends meetings of clubs or organizations: Not on file    Relationship status: Not on file  Other Topics Concern  . Not on file  Social History Narrative   Lives in home with children   Dad has attached apartment   Lives with Sherren Mocha - sig other   Cares for children and dad    Allergies: No Known Allergies  Metabolic Disorder Labs: No results found for: HGBA1C, MPG No results found for: PROLACTIN Lab Results  Component Value Date   CHOL 148 01/29/2017   TRIG 115 01/29/2017   HDL 45 (L) 01/29/2017   CHOLHDL 3.3 01/29/2017   [place] 82 01/29/2017   Lab Results  Component Value Date   TSH 2.21 01/29/2017     Therapeutic Level Labs: No results found for: LITHIUM No results found for: VALPROATE No components found for:  CBMZ  Current Medications: Current Outpatient Medications  Medication Sig Dispense Refill  . ARIPiprazole (ABILIFY) 5 MG tablet Take 1 tablet (5 mg total) by mouth daily. 30 tablet 1  . hydrocortisone-pramoxine (PROCTOFOAM-HC) rectal foam Place 1  applicator rectally 2 (two) times daily. 10 g 1  . sertraline (ZOLOFT) 100 MG tablet Take 1.5 tablets (150 mg total) by mouth daily. 135 tablet 0  . traZODone (DESYREL) 100 MG tablet Take 1 tablet (100 mg total) by mouth at bedtime. 90 tablet 0   No current facility-administered medications for this visit.      Musculoskeletal: Strength & Muscle Tone: N/A Gait & Station: N/A Patient leans: N/A  Psychiatric Specialty Exam: Review of Systems  Psychiatric/Behavioral: Positive for depression. Negative for hallucinations, memory loss, substance abuse and suicidal ideas. The patient is nervous/anxious. The patient does not have insomnia.   All other systems reviewed and are negative.   Last menstrual period 11/25/2018, currently breastfeeding.There is no height or weight on file to calculate BMI.  General Appearance: Fairly Groomed  Eye Contact:  Good  Speech:  Clear and Coherent  Volume:  Normal  Mood:  Anxious  Affect:  Appropriate, Congruent and slightly restrcited  Thought Process:  Coherent  Orientation:  Full (Time, Place, and Person)  Thought Content: Logical   Suicidal Thoughts:  No  Homicidal Thoughts:  No  Memory:  Immediate;   Good  Judgement:  Good  Insight:  Fair  Psychomotor Activity:  Normal  Concentration:  Concentration: Good and Attention Span: Good  Recall:  Good  Fund of Knowledge: Good  Language: Good  Akathisia:  No  Handed:  Right  AIMS (if indicated): not done  Assets:  Communication Skills Desire for Improvement  ADL's:  Intact  Cognition: WNL  Sleep:  Fair   Screenings: PHQ2-9      Office Visit from 11/26/2018 in [employer] from 11/13/2017 [facility] Initial Prenatal from 07/02/2017 in [place] Office Visit from 05/29/2017 in [place] Office Visit from 01/29/2017 in [facility]  PHQ-2 Total Score  2  0  0  0  0  PHQ-9 Total Score  7  -  2  -  -       Assessment and Plan:  Michelle Page is a 43 y.o. year old female with a history of depression,  breast feeding, bipolar disorder by history, who presents for follow up appointment for depression.   # MDD, moderate, recurrent without psychotic features # r/o PTSD She reports slight worsening in anxiety and ego dystonic thoughts since the last visit, which coincided with recent loss of her father.  Will uptitrate sertraline to target depression.  Will continue Abilify at this time as adjunctive treatment for depression.  Will consider tapering down of this medication if she continues to report worsening in ego dystonic thoughts.  Discussed risk of EPS and metabolic side effect.  Discussed potential risk of psychotropics being excreted in breast milk, although the benefit of treating her mental health outweighs risk of exposure to infant.  Will continue trazodone as needed for insomnia.  Noted that although she was diagnosed with bipolar disorder as a child, she denies any hypomanic episode except three days of decreased need for sleep. Will continue to monitor.   Plan I have reviewed and updated plans as below 1.Increasesertraline 200 mg daily 2. Continue Abilify 5 mg daily  3.ContinueTrazodone50- 100mg  at night as needed for sleep 4. Next appointment:10/7 at 9:30 for 30 mins, video -TSH wnl checked by history - front desk to contact for therapy follow up  Past trials of medication:citalopram, bupropion, venlafaxine, carbamazepine, quetiapine (drowsiness), risperidone, Abilify  The patient demonstrates the  following risk  factors for suicide: Chronic risk factors for suicide include:psychiatric disorder ofdepression. Acute risk factorsfor suicide include: unemployment. Protective factorsfor this patient include: positive social support, responsibility to others (children, family), coping skills and hope for the future. Considering these factors, the overall suicide risk at this point appears to below. Patientisappropriate for outpatient follow up.  Norman Clay, MD 12/23/2018, 4:45 PM

## 2018-12-23 ENCOUNTER — Encounter (HOSPITAL_COMMUNITY): Payer: Self-pay | Admitting: Psychiatry

## 2018-12-23 ENCOUNTER — Ambulatory Visit (INDEPENDENT_AMBULATORY_CARE_PROVIDER_SITE_OTHER): Payer: Medicaid Other | Admitting: Psychiatry

## 2018-12-23 ENCOUNTER — Other Ambulatory Visit: Payer: Self-pay

## 2018-12-23 DIAGNOSIS — F331 Major depressive disorder, recurrent, moderate: Secondary | ICD-10-CM

## 2018-12-23 NOTE — Patient Instructions (Addendum)
1. Increase sertraline 200 mg daily 2. Continue Abilify 5 mg daily  3.ContinueTrazodone50- 100mg  at night as needed for sleep 4. Next appointment:10/7 at 9:30

## 2019-01-15 ENCOUNTER — Other Ambulatory Visit (HOSPITAL_COMMUNITY): Payer: Self-pay | Admitting: Psychiatry

## 2019-01-15 MED ORDER — ARIPIPRAZOLE 5 MG PO TABS: 5.0000 mg | ORAL_TABLET | Freq: Every day | ORAL | 0 refills | Status: DC

## 2019-01-22 ENCOUNTER — Ambulatory Visit (INDEPENDENT_AMBULATORY_CARE_PROVIDER_SITE_OTHER): Payer: Self-pay | Admitting: Internal Medicine

## 2019-01-27 ENCOUNTER — Other Ambulatory Visit: Payer: Self-pay

## 2019-01-27 ENCOUNTER — Ambulatory Visit (INDEPENDENT_AMBULATORY_CARE_PROVIDER_SITE_OTHER): Payer: Medicaid Other | Admitting: Internal Medicine

## 2019-01-27 ENCOUNTER — Encounter (INDEPENDENT_AMBULATORY_CARE_PROVIDER_SITE_OTHER): Payer: Self-pay | Admitting: Internal Medicine

## 2019-01-27 VITALS — BP 124/80 | HR 72 | Ht 64.0 in | Wt 193.2 lb

## 2019-01-27 DIAGNOSIS — R5381 Other malaise: Secondary | ICD-10-CM | POA: Diagnosis not present

## 2019-01-27 DIAGNOSIS — F329 Major depressive disorder, single episode, unspecified: Secondary | ICD-10-CM

## 2019-01-27 DIAGNOSIS — E559 Vitamin D deficiency, unspecified: Secondary | ICD-10-CM | POA: Diagnosis not present

## 2019-01-27 DIAGNOSIS — E669 Obesity, unspecified: Secondary | ICD-10-CM | POA: Diagnosis not present

## 2019-01-27 DIAGNOSIS — R5383 Other fatigue: Secondary | ICD-10-CM

## 2019-01-27 HISTORY — DX: Vitamin D deficiency, unspecified: E55.9

## 2019-01-27 HISTORY — DX: Obesity, unspecified: E66.9

## 2019-01-27 NOTE — Patient Instructions (Signed)
TAKE VITAMIN D3 10,000 UNITS/DAY  [email or web address]  Melatonin 1mg  capsules (#60in a bottle)

## 2019-01-27 NOTE — Progress Notes (Signed)
Wellness Office Visit  Subjective:  Patient ID: Michelle Page, female    DOB: 07-Nov-1975  Age: 43 y.o. MRN: [id number]  CC: This lady comes in for follow-up of vitamin D deficiency, depression, fatigue. HPI  She has been seeing a psychiatrist on a regular basis and she says that she feels fairly stable.  However she complains of significant fatigue and wonders if she has become anemic.  She also describes a rash on her abdominal wall which apparently is improving.  She is scratching it. Past Medical History:  Diagnosis Date  . Abnormal Pap smear    colpo/leep  . Anemia   . Anxiety   . Cancer [facility]) 2006   Skin  . Chlamydia infection   . Depression   . Fractures   . Gestational diabetes    metformin  . H/O candidiasis   . H/O varicella   . Headache(784.0)    migraines as a child   . Heart murmur   . High risk HPV infection   . Obesity (BMI 30-39.9) 01/27/2019  . Papanicolaou smear of cervix with positive high risk human papilloma virus (HPV) test 06/03/2017  . Pregnancy induced hypertension    1st & 2nd pregnancy  . Pregnancy induced hypertension    1st & 2nd pregnancy   . Two vessel umbilical cord, antepartum 11/26/2011  . Vaginal Pap smear, abnormal   . Vitamin D deficiency disease 01/27/2019  . Yeast infection       Family History  Problem Relation Age of Onset  . Alcohol abuse Father   . Emphysema Father   . COPD Father   . Mental illness Father        PTSD  . Post-traumatic stress disorder Father   . Alcohol abuse Brother   . Stroke Brother   . Cancer Paternal Grandmother        skin cancer  . Multiple sclerosis Sister   . Depression Mother   . Bipolar disorder Mother   . Anxiety disorder Mother   . Asthma Son   . Heart disease Maternal Grandfather     Social History   Social History Narrative   Divorced.Lives in home with 6 children and boyfriend of 4 years.   Unemployed.     Current Meds  Medication Sig  . ARIPiprazole (ABILIFY) 5 MG  tablet Take 1 tablet (5 mg total) by mouth daily.  . sertraline (ZOLOFT) 100 MG tablet Take 1.5 tablets (150 mg total) by mouth daily. (Patient taking differently: Take 200 mg by mouth daily. )  . traZODone (DESYREL) 100 MG tablet Take 1 tablet (100 mg total) by mouth at bedtime.      Objective:   Today's Vitals: BP 124/80   Pulse 72   Ht 5\' 4"  (1.626 m)   Wt 193 lb 3.2 oz (87.6 kg)   BMI 33.16 kg/m  Vitals with BMI 01/27/2019 11/26/2018 02/27/2018  Height 5\' 4"  5' 3.5" 5\' 4"   Weight 193 lbs 3 oz 192 lbs 196 lbs  BMI 33.15 [id number]  Systolic [id number]  Diastolic 80 67 78  Pulse 72 73 64     Physical Exam   She looks systemically well.  Her weight is stable.  She does appear to have a papular rash with discrete lesions somewhat scattered.  I am not sure what these are.    Assessment   1. Reactive depression   2. Obesity (BMI 30-39.9)   3. Vitamin D deficiency disease   4.  Malaise and fatigue       Tests ordered Orders Placed This Encounter  Procedures  . CBC  . COMPLETE METABOLIC PANEL WITH GFR  . T3, free  . T4  . TSH     Plan: 1. She will continue with all her medications for depression as before. 2. I suggested she try melatonin over-the-counter to see if this will help her sleep better rather than trazodone. 3. Blood work is ordered as above.  I wonder if she is actually thyroid deficient based on a previous T3 level of 2.6 only. 4. I will send her to a dermatologist if the rash does not improve. 5. Follow-up with Judson Roch in February of next year for an annual physical exam.     Doree Albee, MD

## 2019-01-28 ENCOUNTER — Encounter (INDEPENDENT_AMBULATORY_CARE_PROVIDER_SITE_OTHER): Payer: Self-pay | Admitting: Internal Medicine

## 2019-01-28 LAB — COMPLETE METABOLIC PANEL WITH GFR
AG Ratio: 1.8 (calc) (ref 1.0–2.5)
ALT: 14 U/L (ref 6–29)
AST: 23 U/L (ref 10–30)
Albumin: 4.6 g/dL (ref 3.6–5.1)
Alkaline phosphatase (APISO): 76 U/L (ref 31–125)
BUN: 13 mg/dL (ref 7–25)
CO2: 25 mmol/L (ref 20–32)
Calcium: 9.2 mg/dL (ref 8.6–10.2)
Chloride: 104 mmol/L (ref 98–110)
Creat: 0.78 mg/dL (ref 0.50–1.10)
GFR, Est African American: 109 mL/min/{1.73_m2} (ref >=60)
GFR, Est Non African American: 94 mL/min/{1.73_m2} (ref >=60)
Globulin: 2.5 g/dL (calc) (ref 1.9–3.7)
Glucose, Bld: 123 mg/dL — ABNORMAL HIGH (ref 65–99)
Potassium: 3.7 mmol/L (ref 3.5–5.3)
Sodium: 139 mmol/L (ref 135–146)
Total Bilirubin: 0.3 mg/dL (ref 0.2–1.2)
Total Protein: 7.1 g/dL (ref 6.1–8.1)

## 2019-01-28 LAB — CBC
HCT: 36 % (ref 35.0–45.0)
Hemoglobin: 12.1 g/dL (ref 11.7–15.5)
MCH: 29.5 pg (ref 27.0–33.0)
MCHC: 33.6 g/dL (ref 32.0–36.0)
MCV: 87.8 fL (ref 80.0–100.0)
MPV: 10.8 fL (ref 7.5–12.5)
Platelets: 146 10*3/uL (ref 140–400)
RBC: 4.1 10*6/uL (ref 3.80–5.10)
RDW: 12.3 % (ref 11.0–15.0)
WBC: 4.9 10*3/uL (ref 3.8–10.8)

## 2019-01-28 LAB — T3, FREE: T3, Free: 3.1 pg/mL (ref 2.3–4.2)

## 2019-01-28 LAB — TSH: TSH: 2.13 mIU/L

## 2019-01-28 LAB — T4: T4, Total: 5.3 ug/dL (ref 5.1–11.9)

## 2019-01-28 NOTE — Progress Notes (Signed)
Your blood tests are normal, except for elevated blood glucose.  When you come to see Judson Roch next time, if you can be fasting for at least 6 hours overnight, that would be helpful.  She can then check you for diabetes also.  Judson Roch will follow up with you next time.  If you have any questions, do not hesitate to contact me through my chart.

## 2019-01-28 NOTE — Progress Notes (Signed)
Virtual Visit via Video Note  I connected with Michelle Page on 02/04/19 at  9:30 AM EDT by a video enabled telemedicine application and verified that I am speaking with the correct person using two identifiers.   I discussed the limitations of evaluation and management by telemedicine and the availability of in person appointments. The patient expressed understanding and agreed to proceed.      I discussed the assessment and treatment plan with the patient. The patient was provided an opportunity to ask questions and all were answered. The patient agreed with the plan and demonstrated an understanding of the instructions.   The patient was advised to call back or seek an in-person evaluation if the symptoms worsen or if the condition fails to improve as anticipated.  I provided 25 minutes of non-face-to-face time during this encounter.   Norman Clay, MD     [facility] MD/PA/NP OP Progress Note  02/04/2019 10:01 AM Michelle Page  MRN:  [id number]  Chief Complaint:  Chief Complaint    Depression; Follow-up     HPI:  This is a follow up appointment for depression.  She states that she is told by her older daughter and her husband that she has less mood swing when she takes her medication.  However, she has to accuse her husband for cheating, although she notes that it is "not necessarily true." Although it occurs once monthly, she does not think it is related to her menstrual cycle. She feels exhausted for the past two weeks, and don't want to do anything except buying animals (she bought several birds and rats.)  She takes care of some birds, and her children love to take care of the rest. She has been able to take good care of her children, including her one year old child. She has middle insomnia. She feels depressed. She has difficulty in concentration. She has good appetite; she is concerned of weight gain, although she wants to continue Abilify. She denies SI. She feels  anxious, tense at times. She tends to feel more anxious when she goes to the grocery store.  She feels that other people are looking at her, although she knows that they are not. She denies decreased need for sleep or euphoria. She has occasional thoughts/voice of baby crying, although she finds out that it may have come from TV. She has vague VH, stating that she might be seeing somebody around the corner.   197  lbs Wt Readings from Last 3 Encounters:  01/27/19 193 lb 3.2 oz (87.6 kg)  11/26/18 192 lb (87.1 kg)  02/27/18 196 lb (88.9 kg)    Visit Diagnosis: No diagnosis found.  Past Psychiatric History: Please see initial evaluation for full details. I have reviewed the history. No updates at this time.     Past Medical History:  Past Medical History:  Diagnosis Date  . Abnormal Pap smear    colpo/leep  . Anemia   . Anxiety   . Cancer [facility]) 2006   Skin  . Chlamydia infection   . Depression   . Fractures   . Gestational diabetes    metformin  . H/O candidiasis   . H/O varicella   . Headache(784.0)    migraines as a child   . Heart murmur   . High risk HPV infection   . Obesity (BMI 30-39.9) 01/27/2019  . Papanicolaou smear of cervix with positive high risk human papilloma virus (HPV) test 06/03/2017  . Pregnancy induced hypertension  1st & 2nd pregnancy  . Pregnancy induced hypertension    1st & 2nd pregnancy   . Two vessel umbilical cord, antepartum 11/26/2011  . Vaginal Pap smear, abnormal   . Vitamin D deficiency disease 01/27/2019  . Yeast infection     Past Surgical History:  Procedure Laterality Date  . APPENDECTOMY  08/14/2017  . COLPOSCOPY    . LAPAROSCOPIC APPENDECTOMY N/A 08/14/2017   Procedure: APPENDECTOMY LAPAROSCOPIC;  Surgeon: Erroll Luna, MD;  Location: [place];  Service: General;  Laterality: N/A;  . LEEP    . TUBAL LIGATION N/A 01/22/2018   Procedure: POST PARTUM TUBAL LIGATION;  Surgeon: Gwynne Edinger, MD;  Location: [place];   Service: Gynecology;  Laterality: N/A;  . WISDOM TOOTH EXTRACTION      Family Psychiatric History: Please see initial evaluation for full details. I have reviewed the history. No updates at this time.     Family History:  Family History  Problem Relation Age of Onset  . Alcohol abuse Father   . Emphysema Father   . COPD Father   . Mental illness Father        PTSD  . Post-traumatic stress disorder Father   . Alcohol abuse Brother   . Stroke Brother   . Cancer Paternal Grandmother        skin cancer  . Multiple sclerosis Sister   . Depression Mother   . Bipolar disorder Mother   . Anxiety disorder Mother   . Asthma Son   . Heart disease Maternal Grandfather     Social History:  Social History   Socioeconomic History  . Marital status: Significant Other    Spouse name: todd  . Number of children: 6  . Years of education: 65  . Highest education level: Not on file  Occupational History  . Occupation: unemployed    Comment: caregiver  Social Needs  . Financial resource strain: Not hard at all  . Food insecurity    Worry: Never true    Inability: Never true  . Transportation needs    Medical: No    Non-medical: Not on file  Tobacco Use  . Smoking status: Former Smoker    Types: Cigarettes  . Smokeless tobacco: Never Used  . Tobacco comment: quit smoking in2007  Substance and Sexual Activity  . Alcohol use: No    Frequency: Never    Comment: occ before pregnancy  . Drug use: No  . Sexual activity: Yes    Birth control/protection: Surgical    Comment: tubal  Lifestyle  . Physical activity    Days per week: Not on file    Minutes per session: Not on file  . Stress: To some extent  Relationships  . Social Herbalist on phone: Not on file    Gets together: Not on file    Attends religious service: Not on file    Active member of club or organization: Not on file    Attends meetings of clubs or organizations: Not on file    Relationship status:  Not on file  Other Topics Concern  . Not on file  Social History Narrative   Divorced.Lives in home with 6 children and boyfriend of 4 years.   Unemployed.    Allergies: No Known Allergies  Metabolic Disorder Labs: No results found for: HGBA1C, MPG No results found for: PROLACTIN Lab Results  Component Value Date   CHOL 148 01/29/2017   TRIG 115 01/29/2017  HDL 45 (L) 01/29/2017   CHOLHDL 3.3 01/29/2017   LDLCALC 82 01/29/2017   Lab Results  Component Value Date   TSH 2.13 01/27/2019   TSH 2.21 01/29/2017    Therapeutic Level Labs: No results found for: LITHIUM No results found for: VALPROATE No components found for:  CBMZ  Current Medications: Current Outpatient Medications  Medication Sig Dispense Refill  . ARIPiprazole (ABILIFY) 5 MG tablet Take 1 tablet (5 mg total) by mouth daily. 30 tablet 0  . sertraline (ZOLOFT) 100 MG tablet Take 1.5 tablets (150 mg total) by mouth daily. (Patient taking differently: Take 200 mg by mouth daily. ) 135 tablet 0  . traZODone (DESYREL) 100 MG tablet Take 1 tablet (100 mg total) by mouth at bedtime. 90 tablet 0   No current facility-administered medications for this visit.      Musculoskeletal: Strength & Muscle Tone: N/A Gait & Station: N/A Patient leans: N/A  Psychiatric Specialty Exam: Review of Systems  Psychiatric/Behavioral: Positive for depression. Negative for hallucinations, memory loss, substance abuse and suicidal ideas. The patient is nervous/anxious and has insomnia.   All other systems reviewed and are negative.   currently breastfeeding.There is no height or weight on file to calculate BMI.  General Appearance: Fairly Groomed  Eye Contact:  Good  Speech:  Clear and Coherent  Volume:  Normal  Mood:  Anxious  Affect:  Appropriate, Congruent and Restricted  Thought Process:  Coherent  Orientation:  Full (Time, Place, and Person)  Thought Content: Logical and Paranoid Ideation   Suicidal Thoughts:  No   Homicidal Thoughts:  No  Memory:  Immediate;   Good  Judgement:  Good  Insight:  Fair  Psychomotor Activity:  Normal  Concentration:  Concentration: Good and Attention Span: Good  Recall:  Good  Fund of Knowledge: Good  Language: Good  Akathisia:  No  Handed:  Right  AIMS (if indicated): not done  Assets:  Communication Skills Desire for Improvement  ADL's:  Intact  Cognition: WNL  Sleep:  Poor   Screenings: PHQ2-9     Office Visit from 11/26/2018 in [facility] Initial Prenatal from 07/02/2017 in [place] Office Visit from 05/29/2017 in [place] Office Visit from 01/29/2017 in [facility]  PHQ-2 Total Score  2  0  0  0  0  PHQ-9 Total Score  7  -  2  -  -       Assessment and Plan:  Michelle Page is a 43 y.o. year old female with a history of depression, breast feeding, , who presents for follow up appointment for No diagnosis found.  # MDD, moderate, recurrent without psychotic features # r/o PTSD Although there has been overall improvement in anxiety and ego dystonic vague paranoia since up titration of sertraline, she complains of significant fatigue and depressive symptoms on today's exam.  Will add bupropion as adjunctive treatment for depression.  Discussed potential risk of worsening in anxiety and headache.  Will continue sertraline to target depression.  Will continue Abilify as adjunctive treatment for depression,  vague hallucinations and paranoia.  Discussed risk of EPS and metabolic side effect. Discussed potential risk of psychotropics being excreted in breast milk, although the benefit of treating her mental health outweighs risk of exposure to infant.    Will continue trazodone as needed for insomnia.  Noted that although she was diagnosed with bipolar disorder as a  child, she denies any hypomanic episode except three days of decreased need for sleep.  Will continue to monitor.   Plan I have reviewed and updated plans as below 1. Continuesertraline 200 mg daily 2. Start bupropion 150 mg daily  3.Continue Abilify 5mg  daily  4.ContinueTrazodone50- 100mg  at night as needed for sleep 5. Next appointment:in December - Informed the patient that this examiner will be on leave starting around mid-November for a few months, and the patient will be seen by a covering provider if necessary during that time.  -TSH wnl checked by history - front desk to contact for therapy follow up  Past trials of medication:citalopram, bupropion, venlafaxine, carbamazepine, quetiapine (drowsiness), risperidone, Abilify  The patient demonstrates the following risk factors for suicide: Chronic risk factors for suicide include:psychiatric disorder ofdepression. Acute risk factorsfor suicide include: unemployment. Protective factorsfor this patient include: positive social support, responsibility to others (children, family), coping skills and hope for the future. Considering these factors, the overall suicide risk at this point appears to below. Patientisappropriate for outpatient follow up.  The duration of this appointment visit was 25 minutes of non face-to-face time with the patient.  Greater than 50% of this time was spent in counseling, explanation of  diagnosis, planning of further management, and coordination of care.  Norman Clay, MD 02/04/2019, 10:01 AM

## 2019-01-30 DIAGNOSIS — B977 Papillomavirus as the cause of diseases classified elsewhere: Secondary | ICD-10-CM | POA: Insufficient documentation

## 2019-01-30 DIAGNOSIS — R011 Cardiac murmur, unspecified: Secondary | ICD-10-CM | POA: Insufficient documentation

## 2019-02-04 ENCOUNTER — Other Ambulatory Visit: Payer: Self-pay

## 2019-02-04 ENCOUNTER — Encounter (HOSPITAL_COMMUNITY): Payer: Self-pay | Admitting: Psychiatry

## 2019-02-04 ENCOUNTER — Ambulatory Visit (INDEPENDENT_AMBULATORY_CARE_PROVIDER_SITE_OTHER): Payer: Medicaid Other | Admitting: Psychiatry

## 2019-02-04 DIAGNOSIS — F331 Major depressive disorder, recurrent, moderate: Secondary | ICD-10-CM

## 2019-02-04 MED ORDER — SERTRALINE HCL 100 MG PO TABS: 200.0000 mg | ORAL_TABLET | Freq: Every day | ORAL | 1 refills | Status: DC

## 2019-02-04 MED ORDER — ARIPIPRAZOLE 5 MG PO TABS: 5.0000 mg | ORAL_TABLET | Freq: Every day | ORAL | 0 refills | Status: DC

## 2019-02-04 MED ORDER — TRAZODONE HCL 100 MG PO TABS: 100.0000 mg | ORAL_TABLET | Freq: Every day | ORAL | 1 refills | Status: DC

## 2019-02-04 MED ORDER — BUPROPION HCL ER (XL) 150 MG PO TB24: 150.0000 mg | ORAL_TABLET | Freq: Every day | ORAL | 2 refills | Status: DC

## 2019-02-04 NOTE — Patient Instructions (Signed)
1. Continuesertraline 200 mg daily 2. Start bupropion 150 mg daily  3.Continue Abilify 5mg  daily  4.ContinueTrazodone50- 100mg  at night as needed for sleep 5. Next appointment:in December

## 2019-02-13 ENCOUNTER — Telehealth (HOSPITAL_COMMUNITY): Payer: Self-pay | Admitting: *Deleted

## 2019-02-13 NOTE — Telephone Encounter (Signed)
Called [employer] for prior authorization of Abilify. Spoke with Ermin who gave approval until 02/13/2020. Auth [id number]. Called to notify pharmacy.

## 2019-03-10 ENCOUNTER — Ambulatory Visit (INDEPENDENT_AMBULATORY_CARE_PROVIDER_SITE_OTHER): Payer: Medicaid Other | Admitting: Licensed Clinical Social Worker

## 2019-03-10 ENCOUNTER — Other Ambulatory Visit: Payer: Self-pay

## 2019-03-10 ENCOUNTER — Encounter (HOSPITAL_COMMUNITY): Payer: Self-pay | Admitting: Licensed Clinical Social Worker

## 2019-03-10 DIAGNOSIS — F331 Major depressive disorder, recurrent, moderate: Secondary | ICD-10-CM | POA: Diagnosis not present

## 2019-03-10 NOTE — Progress Notes (Signed)
  Virtual Visit via Video Note  I connected with Michelle Page on 03/10/19 at  3:00 PM EST by a video enabled telemedicine application and verified that I am speaking with the correct person using two identifiers.  Location: Patient: Home Provider: Office   I discussed the limitations of evaluation and management by telemedicine and the availability of in person appointments. The patient expressed understanding and agreed to proceed.  Participation Level: Active  Behavioral Response: CasualAlertAnxious  Type of Therapy: Individual Therapy  Treatment Goals addressed: Coping  Interventions: CBT and Solution Focused  Summary: Michelle Page is a 43 y.o. female who presents oriented x5 (person, place, situation, time, and object), casually dressed, appropriately groomed, average height, average weight, and cooperative to address mood. Patient has a history of medical treatment including headaches and heart murmur. Patient has minimal history of mental health treatment including medication management. Patient denies suicidal and homicidal ideations. Patient denies psychosis including auditory and visual hallucinations. Patient denies substance abuse. Patient is at low risk for lethality.  Physically: Patient has difficulty with focusing. She starts projects such as cleaning a closet and gets distracted. She eventually comes back to finish what she started. Patient noted that sleep is disrupted but she is getting out of bed everyday. Patient's appetite is good.  Spiritually/values: No issues identified.  Relationships: Patient is getting along with her family. Her relationship with her husband is going well. She still has thoughts of him cheating on her but recognizes that she has not evidence or good reason to think this of him.  Emotionally/Mentally/Behavior: Patient's mood has been good. Her anxiety has improved too. She is engaging in impulsive shopping. She did this before but is  much more impulsive. She feels like the medication has helped her mood but her focus is worse.   Patient is engaged in session. Patient responded well to interventions. Patient continues to meet criteria for MDD (major depressive disorder), recurrent episode, moderate. Patient will continue in outpatient therapy due to being the least restrictive service to meet her needs. Patient made minimal progress on her goals.   Suicidal/Homicidal: Negativewithout intent/plan  Therapist Response: Therapist reviewed patient's thoughts and behaviors. Therapist utilized CBT to address mood. Therapist processed patient's thoughts to address mood. Therapist processed patient's thoughts to identify triggers for mood. Therapist discussed with patient what has gone well and triggers for distraction.  Plan: Return again in  weeks.  Diagnosis: Axis I: Bipolar, mixed and MDD (major depressive disorder), recurrent episode, moderate    Axis II: No diagnosis  I discussed the assessment and treatment plan with the patient. The patient was provided an opportunity to ask questions and all were answered. The patient agreed with the plan and demonstrated an understanding of the instructions.   The patient was advised to call back or seek an in-person evaluation if the symptoms worsen or if the condition fails to improve as anticipated.  I provided 40 minutes of non-face-to-face time during this encounter.  Glori Bickers, LCSW 03/10/2019

## 2019-04-06 ENCOUNTER — Ambulatory Visit (HOSPITAL_COMMUNITY): Payer: Medicaid Other | Admitting: Psychiatry

## 2019-04-07 ENCOUNTER — Other Ambulatory Visit: Payer: Self-pay

## 2019-04-07 ENCOUNTER — Ambulatory Visit (HOSPITAL_COMMUNITY): Payer: Medicaid Other | Admitting: Psychiatry

## 2019-04-21 ENCOUNTER — Other Ambulatory Visit: Payer: Self-pay

## 2019-04-21 ENCOUNTER — Ambulatory Visit (INDEPENDENT_AMBULATORY_CARE_PROVIDER_SITE_OTHER): Payer: Medicaid Other | Admitting: Licensed Clinical Social Worker

## 2019-04-21 DIAGNOSIS — F331 Major depressive disorder, recurrent, moderate: Secondary | ICD-10-CM

## 2019-04-22 ENCOUNTER — Encounter (HOSPITAL_COMMUNITY): Payer: Self-pay | Admitting: Licensed Clinical Social Worker

## 2019-04-22 NOTE — Progress Notes (Signed)
  Virtual Visit via Video Note  I connected with Michelle Page on 04/22/19 at  1:00 PM EST by a video enabled telemedicine application and verified that I am speaking with the correct person using two identifiers.  Location: Patient: Home Provider: Office   I discussed the limitations of evaluation and management by telemedicine and the availability of in person appointments. The patient expressed understanding and agreed to proceed.  Participation Level: Active  Behavioral Response: CasualAlertAnxious  Type of Therapy: Individual Therapy  Treatment Goals addressed: Coping  Interventions: CBT and Solution Focused  Summary: Michelle Page is a 43 y.o. female who presents oriented x5 (person, place, situation, time, and object), casually dressed, appropriately groomed, average height, average weight, and cooperative to address mood. Patient has a history of medical treatment including headaches and heart murmur. Patient has minimal history of mental health treatment including medication management. Patient denies suicidal and homicidal ideations. Patient denies psychosis including auditory and visual hallucinations. Patient denies substance abuse. Patient is at low risk for lethality.  Physically: Patient continues to have difficulty with concentration. She feels like is unable to stay on one task before she starts another.  Spiritually/values: No issues identified.  Relationships: Patient is getting along with her family. She has a good relationship with her husband. Patient noted that her husband is frustrated with her due to her spending.  Emotionally/Mentally/Behavior: Patient's mood has been good. Her anxiety is stable. Patient continues to impulse spend. She has maxed out credit cards. Her husband is frustrated. Patient was unable to give explanation as to what is driving her impulse spending. After discussion, patient was able to identify that part of why she impulse spends is  because she buys things that make her children happy. She also admitted that she has spent a lot of money on "fish tables" which is a form of gambling. After discussion, patient understood that part of what is driving her impulse spending is when her father passed away this summer. That is when her impulse spending increased. Patient agreed to set limitations for her spending and slowly buying less and less.   Patient is engaged in session. Patient responded well to interventions. Patient continues to meet criteria for MDD (major depressive disorder), recurrent episode, moderate. Patient will continue in outpatient therapy due to being the least restrictive service to meet her needs. Patient made minimal progress on her goals.   Suicidal/Homicidal: Negativewithout intent/plan  Therapist Response: Therapist reviewed patient's thoughts and behaviors. Therapist utilized CBT to address mood. Therapist processed patient's thoughts to address mood. Therapist processed patient's thoughts to identify triggers for mood. Therapist discussed with patient her focus, impulse control, and grief.   Plan: Return again in 3  Weeks. Patient will be transferred to new therapist Maye Hides, LCSW.   Diagnosis: Axis I: Bipolar, mixed and MDD (major depressive disorder), recurrent episode, moderate    Axis II: No diagnosis  I discussed the assessment and treatment plan with the patient. The patient was provided an opportunity to ask questions and all were answered. The patient agreed with the plan and demonstrated an understanding of the instructions.   The patient was advised to call back or seek an in-person evaluation if the symptoms worsen or if the condition fails to improve as anticipated.  I provided 40 minutes of non-face-to-face time during this encounter.  Glori Bickers, LCSW 04/22/2019

## 2019-04-30 ENCOUNTER — Encounter: Payer: Self-pay | Admitting: Psychiatry

## 2019-04-30 ENCOUNTER — Other Ambulatory Visit: Payer: Self-pay

## 2019-04-30 ENCOUNTER — Ambulatory Visit (INDEPENDENT_AMBULATORY_CARE_PROVIDER_SITE_OTHER): Payer: Medicaid Other | Admitting: Psychiatry

## 2019-04-30 DIAGNOSIS — F3341 Major depressive disorder, recurrent, in partial remission: Secondary | ICD-10-CM

## 2019-04-30 MED ORDER — ARIPIPRAZOLE 5 MG PO TABS: 5.0000 mg | ORAL_TABLET | Freq: Every day | ORAL | 0 refills | Status: DC

## 2019-04-30 MED ORDER — SERTRALINE HCL 100 MG PO TABS: 200.0000 mg | ORAL_TABLET | Freq: Every day | ORAL | 0 refills | Status: DC

## 2019-04-30 MED ORDER — TRAZODONE HCL 150 MG PO TABS: 150.0000 mg | ORAL_TABLET | Freq: Every day | ORAL | 0 refills | Status: DC

## 2019-04-30 MED ORDER — BUPROPION HCL ER (XL) 300 MG PO TB24: 300.0000 mg | ORAL_TABLET | ORAL | 0 refills | Status: DC

## 2019-04-30 MED ORDER — TRAZODONE HCL 100 MG PO TABS: 100.0000 mg | ORAL_TABLET | Freq: Every day | ORAL | 0 refills | Status: DC

## 2019-04-30 NOTE — Progress Notes (Signed)
[place] MD OP Progress Note  I connected with  Michelle Page on 04/30/19 by a video enabled telemedicine application and verified that I am speaking with the correct person using two identifiers.   I discussed the limitations of evaluation and management by telemedicine. The patient expressed understanding and agreed to proceed.    04/30/2019 2:36 PM Michelle Page  MRN:  [id number]  Chief Complaint: " I think I am on too many medications."  HPI: Initially patient stated that she feels she is on too many medications.  When I started discussing every medicine one by one she reported that she feels sertraline has helped her with anxiety.  She also reported that Abilify has helped her significantly with hallucinations and paranoid ideations.  She still has occasional auditory hallucinations but they do not last for more than a few seconds.  She informed that earlier she used to believe that her husband was having an affair but now she does not believe that anymore.  She stated that her relationship with her husband has improved.  She then stated that she really likes Wellbutrin as after it was started her appetite has been current.  She also thinks it is helping her mood.  She complained that she still not sleeping well.  She wakes up middle of the night and has hard time going back to sleep after that. She also complained of difficulty in maintaining her concentration. She denied any suicidal ideations.  Visit Diagnosis:    ICD-10-CM   1. MDD (major depressive disorder), recurrent, in partial remission ([place])  F33.41     Past Psychiatric History: MDD with psychotic symptoms.  Past Medical History:  Past Medical History:  Diagnosis Date  . Abnormal Pap smear    colpo/leep  . Anemia   . Anxiety   . Cancer [facility]) 2006   Skin  . Chlamydia infection   . Depression   . Fractures   . Gestational diabetes    metformin  . H/O candidiasis   . H/O varicella   . Headache(784.0)     migraines as a child   . Heart murmur   . High risk HPV infection   . Obesity (BMI 30-39.9) 01/27/2019  . Papanicolaou smear of cervix with positive high risk human papilloma virus (HPV) test 06/03/2017  . Pregnancy induced hypertension    1st & 2nd pregnancy  . Pregnancy induced hypertension    1st & 2nd pregnancy   . Two vessel umbilical cord, antepartum 11/26/2011  . Vaginal Pap smear, abnormal   . Vitamin D deficiency disease 01/27/2019  . Yeast infection     Past Surgical History:  Procedure Laterality Date  . APPENDECTOMY  08/14/2017  . COLPOSCOPY    . LAPAROSCOPIC APPENDECTOMY N/A 08/14/2017   Procedure: APPENDECTOMY LAPAROSCOPIC;  Surgeon: Erroll Luna, MD;  Location: [place];  Service: General;  Laterality: N/A;  . LEEP    . TUBAL LIGATION N/A 01/22/2018   Procedure: POST PARTUM TUBAL LIGATION;  Surgeon: Gwynne Edinger, MD;  Location: [place];  Service: Gynecology;  Laterality: N/A;  . WISDOM TOOTH EXTRACTION      Family Psychiatric History: see below  Family History:  Family History  Problem Relation Age of Onset  . Alcohol abuse Father   . Emphysema Father   . COPD Father   . Mental illness Father        PTSD  . Post-traumatic stress disorder Father   . Alcohol abuse Brother   .  Stroke Brother   . Cancer Paternal Grandmother        skin cancer  . Multiple sclerosis Sister   . Depression Mother   . Bipolar disorder Mother   . Anxiety disorder Mother   . Asthma Son   . Heart disease Maternal Grandfather     Social History:  Social History   Socioeconomic History  . Marital status: Significant Other    Spouse name: todd  . Number of children: 6  . Years of education: 32  . Highest education level: Not on file  Occupational History  . Occupation: unemployed    Comment: caregiver  Tobacco Use  . Smoking status: Former Smoker    Types: Cigarettes  . Smokeless tobacco: Never Used  . Tobacco comment: quit smoking in2007  Substance and  Sexual Activity  . Alcohol use: No    Comment: occ before pregnancy  . Drug use: No  . Sexual activity: Yes    Birth control/protection: Surgical    Comment: tubal  Other Topics Concern  . Not on file  Social History Narrative   Divorced.Lives in home with 6 children and boyfriend of 4 years.   Unemployed.   Social Determinants of Health   Financial Resource Strain:   . Difficulty of Paying Living Expenses: Not on file  Food Insecurity:   . Worried About Charity fundraiser in the Last Year: Not on file  . Ran Out of Food in the Last Year: Not on file  Transportation Needs:   . Lack of Transportation (Medical): Not on file  . Lack of Transportation (Non-Medical): Not on file  Physical Activity:   . Days of Exercise per Week: Not on file  . Minutes of Exercise per Session: Not on file  Stress:   . Feeling of Stress : Not on file  Social Connections:   . Frequency of Communication with Friends and Family: Not on file  . Frequency of Social Gatherings with Friends and Family: Not on file  . Attends Religious Services: Not on file  . Active Member of Clubs or Organizations: Not on file  . Attends Archivist Meetings: Not on file  . Marital Status: Not on file    Allergies: No Known Allergies  Metabolic Disorder Labs: No results found for: HGBA1C, MPG No results found for: PROLACTIN Lab Results  Component Value Date   CHOL 148 01/29/2017   TRIG 115 01/29/2017   HDL 45 (L) 01/29/2017   CHOLHDL 3.3 01/29/2017   LDLCALC 82 01/29/2017   Lab Results  Component Value Date   TSH 2.13 01/27/2019   TSH 2.21 01/29/2017    Therapeutic Level Labs: No results found for: LITHIUM No results found for: VALPROATE No components found for:  CBMZ  Current Medications: Current Outpatient Medications  Medication Sig Dispense Refill  . ARIPiprazole (ABILIFY) 5 MG tablet Take 1 tablet (5 mg total) by mouth daily. 90 tablet 0  . buPROPion (WELLBUTRIN XL) 150 MG 24 hr  tablet Take 1 tablet (150 mg total) by mouth daily. 30 tablet 2  . sertraline (ZOLOFT) 100 MG tablet Take 2 tablets (200 mg total) by mouth daily. 180 tablet 1  . traZODone (DESYREL) 100 MG tablet Take 1 tablet (100 mg total) by mouth at bedtime. 90 tablet 1   No current facility-administered medications for this visit.     Musculoskeletal: Strength & Muscle Tone: unable to assess due to telemed visit Gait & Station: unable to assess due to telemed  visit Patient leans: unable to assess due to telemed visit  Psychiatric Specialty Exam: Review of Systems  currently breastfeeding.There is no height or weight on file to calculate BMI.  General Appearance: Fairly Groomed  Eye Contact:  Good  Speech:  Clear and Coherent and Normal Rate  Volume:  Normal  Mood:  Slight depressed  Affect:  Congruent  Thought Process:  Goal Directed, Linear and Descriptions of Associations: Intact  Orientation:  Full (Time, Place, and Person)  Thought Content: Logical   Suicidal Thoughts:  No  Homicidal Thoughts:  No  Memory:  Recent;   Good Remote;   Good  Judgement:  Fair  Insight:  Fair  Psychomotor Activity:  Normal  Concentration:  Concentration: Good and Attention Span: Good  Recall:  Good  Fund of Knowledge: Good  Language: Good  Akathisia:  Negative  Handed:  Right  AIMS (if indicated): not done  Assets:  Communication Skills Desire for Improvement Financial Resources/Insurance Housing  ADL's:  Intact  Cognition: WNL  Sleep:  Poor   Screenings: PHQ2-9     Office Visit from 11/26/2018 in [place] [facility] Initial Prenatal from 07/02/2017 in [place] Office Visit from 05/29/2017 in [place] Office Visit from 01/29/2017 in [facility]  PHQ-2 Total Score  2  0  0  0  0  PHQ-9 Total Score  7  --  2  --  --       Assessment and Plan: Patient initially stated that she wanted to see if  she could cut down some of her medications, however, as the session progressed patient acknowledged that the medications are helping.  She wants to try a higher dose of Wellbutrin to see if that will help her improve her concentration and also help her mood better.  She is agreeable to continuing Abilify and Zoloft at the same dose.  She is agreeable to trying higher dose of trazodone to help her sleep better. Potential side effects of medication and risks vs benefits of treatment vs non-treatment were explained and discussed. All questions were answered.  1. MDD (major depressive disorder), recurrent, with psychotic features, in partial remission (HCC)  - ARIPiprazole (ABILIFY) 5 MG tablet; Take 1 tablet (5 mg total) by mouth daily.  Dispense: 90 tablet; Refill: 0 - sertraline (ZOLOFT) 100 MG tablet; Take 2 tablets (200 mg total) by mouth daily.  Dispense: 180 tablet; Refill: 0 - traZODone (DESYREL) 150 MG tablet; Take 1 tablet (100 mg total) by mouth at bedtime.  Dispense: 90 tablet; Refill: 0 - Increase buPROPion (WELLBUTRIN XL) 300 MG 24 hr tablet; Take 1 tablet (300 mg total) by mouth every morning.  Dispense: 90 tablet; Refill: 0  F/up in 6 weeks.  N[place]ada Crane, MD 04/30/2019, 2:36 PM

## 2019-06-03 ENCOUNTER — Encounter (INDEPENDENT_AMBULATORY_CARE_PROVIDER_SITE_OTHER): Payer: Medicaid Other | Admitting: Nurse Practitioner

## 2019-06-11 NOTE — Progress Notes (Signed)
Virtual Visit via Video Note  I connected with Michelle Page on 06/19/19 at 10:00 AM EST by a video enabled telemedicine application and verified that I am speaking with the correct person using two identifiers.   I discussed the limitations of evaluation and management by telemedicine and the availability of in person appointments. The patient expressed understanding and agreed to proceed.     I discussed the assessment and treatment plan with the patient. The patient was provided an opportunity to ask questions and all were answered. The patient agreed with the plan and demonstrated an understanding of the instructions.   The patient was advised to call back or seek an in-person evaluation if the symptoms worsen or if the condition fails to improve as anticipated.  I provided 15 minutes of non-face-to-face time during this encounter.   Norman Clay, MD    [facility] MD/PA/NP OP Progress Note  06/19/2019 10:40 AM Michelle Page  MRN:  [id number]  Chief Complaint:  Chief Complaint    Depression; Follow-up; Paranoid     HPI:  - bupropion was uptitrated by Dr. Toy Care at the last visit  This is a follow-up appointment for depression.  She is "not angry, not crying" anymore after bupropion is uptitrated. She states that she continues to have delusion.  She feels that she hears something "like a video sound" and feels that the outside of her car is changed. Although she thinks it is not true, she continues to have this thought. She also has AH of people talking to her and has VH of seeing people. She does not share this with her boyfriend. Her boyfriend thinks that she is having misperception. She enjoys spending time with her animals.  She goes outside for her dog at times.  She has middle insomnia. She sleeps 9 pm - 7 am. She feels fatigue. She feels less depressed.  She has fair concentration.  She denies SI.  She feels anxious and tense at times.  She denies panic attacks.  She is not  doing breast-feeding anymore.    Visit Diagnosis:    ICD-10-CM   1. Moderate episode of recurrent major depressive disorder (HCC)  F33.1 ARIPiprazole (ABILIFY) 5 MG tablet    buPROPion (WELLBUTRIN XL) 300 MG 24 hr tablet    sertraline (ZOLOFT) 100 MG tablet    Past Psychiatric History: Please see initial evaluation for full details. I have reviewed the history. No updates at this time.     Past Medical History:  Past Medical History:  Diagnosis Date  . Abnormal Pap smear    colpo/leep  . Anemia   . Anxiety   . Cancer [facility]) 2006   Skin  . Chlamydia infection   . Depression   . Fractures   . Gestational diabetes    metformin  . H/O candidiasis   . H/O varicella   . Headache(784.0)    migraines as a child   . Heart murmur   . High risk HPV infection   . Obesity (BMI 30-39.9) 01/27/2019  . Papanicolaou smear of cervix with positive high risk human papilloma virus (HPV) test 06/03/2017  . Pregnancy induced hypertension    1st & 2nd pregnancy  . Pregnancy induced hypertension    1st & 2nd pregnancy   . Two vessel umbilical cord, antepartum 11/26/2011  . Vaginal Pap smear, abnormal   . Vitamin D deficiency disease 01/27/2019  . Yeast infection     Past Surgical History:  Procedure Laterality Date  .  APPENDECTOMY  08/14/2017  . COLPOSCOPY    . LAPAROSCOPIC APPENDECTOMY N/A 08/14/2017   Procedure: APPENDECTOMY LAPAROSCOPIC;  Surgeon: Erroll Luna, MD;  Location: [place];  Service: General;  Laterality: N/A;  . LEEP    . TUBAL LIGATION N/A 01/22/2018   Procedure: POST PARTUM TUBAL LIGATION;  Surgeon: Gwynne Edinger, MD;  Location: [place];  Service: Gynecology;  Laterality: N/A;  . WISDOM TOOTH EXTRACTION      Family Psychiatric History: Please see initial evaluation for full details. I have reviewed the history. No updates at this time.     Family History:  Family History  Problem Relation Age of Onset  . Alcohol abuse Father   . Emphysema Father   .  COPD Father   . Mental illness Father        PTSD  . Post-traumatic stress disorder Father   . Alcohol abuse Brother   . Stroke Brother   . Cancer Paternal Grandmother        skin cancer  . Multiple sclerosis Sister   . Depression Mother   . Bipolar disorder Mother   . Anxiety disorder Mother   . Asthma Son   . Heart disease Maternal Grandfather     Social History:  Social History   Socioeconomic History  . Marital status: Significant Other    Spouse name: todd  . Number of children: 6  . Years of education: 1  . Highest education level: Not on file  Occupational History  . Occupation: unemployed    Comment: caregiver  Tobacco Use  . Smoking status: Former Smoker    Types: Cigarettes  . Smokeless tobacco: Never Used  . Tobacco comment: quit smoking in2007  Substance and Sexual Activity  . Alcohol use: No    Comment: occ before pregnancy  . Drug use: No  . Sexual activity: Yes    Birth control/protection: Surgical    Comment: tubal  Other Topics Concern  . Not on file  Social History Narrative   Divorced.Lives in home with 6 children and boyfriend of 4 years.   Unemployed.   Social Determinants of Health   Financial Resource Strain:   . Difficulty of Paying Living Expenses: Not on file  Food Insecurity:   . Worried About Charity fundraiser in the Last Year: Not on file  . Ran Out of Food in the Last Year: Not on file  Transportation Needs:   . Lack of Transportation (Medical): Not on file  . Lack of Transportation (Non-Medical): Not on file  Physical Activity:   . Days of Exercise per Week: Not on file  . Minutes of Exercise per Session: Not on file  Stress:   . Feeling of Stress : Not on file  Social Connections:   . Frequency of Communication with Friends and Family: Not on file  . Frequency of Social Gatherings with Friends and Family: Not on file  . Attends Religious Services: Not on file  . Active Member of Clubs or Organizations: Not on file  .  Attends Archivist Meetings: Not on file  . Marital Status: Not on file    Allergies: No Known Allergies  Metabolic Disorder Labs: No results found for: HGBA1C, MPG No results found for: PROLACTIN Lab Results  Component Value Date   CHOL 148 01/29/2017   TRIG 115 01/29/2017   HDL 45 (L) 01/29/2017   CHOLHDL 3.3 01/29/2017   LDLCALC 82 01/29/2017   Lab Results  Component Value  Date   TSH 2.13 01/27/2019   TSH 2.21 01/29/2017    Therapeutic Level Labs: No results found for: LITHIUM No results found for: VALPROATE No components found for:  CBMZ  Current Medications: Current Outpatient Medications  Medication Sig Dispense Refill  . ARIPiprazole (ABILIFY) 2 MG tablet Take total of 7 mg (along with 5 mg) daily 30 tablet 1  . [START ON 07/29/2019] ARIPiprazole (ABILIFY) 5 MG tablet Take total of 7 mg (along with 2 mg) daily 90 tablet 0  . [START ON 07/29/2019] buPROPion (WELLBUTRIN XL) 300 MG 24 hr tablet Take 1 tablet (300 mg total) by mouth every morning. 90 tablet 0  . [START ON 07/29/2019] sertraline (ZOLOFT) 100 MG tablet Take 2 tablets (200 mg total) by mouth daily. 180 tablet 0  . [START ON 07/29/2019] traZODone (DESYREL) 150 MG tablet Take 1 tablet (150 mg total) by mouth at bedtime. 90 tablet 0   No current facility-administered medications for this visit.     Musculoskeletal: Strength & Muscle Tone: N/A Gait & Station: N/A Patient leans: N/A  Psychiatric Specialty Exam: Review of Systems  Psychiatric/Behavioral: Positive for sleep disturbance. Negative for agitation, behavioral problems, confusion, decreased concentration, dysphoric mood, hallucinations, self-injury and suicidal ideas. The patient is nervous/anxious. The patient is not hyperactive.   All other systems reviewed and are negative.   currently breastfeeding.There is no height or weight on file to calculate BMI.  General Appearance: Fairly Groomed  Eye Contact:  Good  Speech:  Clear and  Coherent  Volume:  Normal  Mood:  Anxious  Affect:  Appropriate, Congruent and Restricted  Thought Process:  Coherent  Orientation:  Full (Time, Place, and Person)  Thought Content: Logical and Paranoid Ideation   Suicidal Thoughts:  No  Homicidal Thoughts:  No  Memory:  Immediate;   Good  Judgement:  Good  Insight:  Fair  Psychomotor Activity:  Normal  Concentration:  Concentration: Good and Attention Span: Good  Recall:  Good  Fund of Knowledge: Good  Language: Good  Akathisia:  No  Handed:  Right  AIMS (if indicated): not done  Assets:  Communication Skills Desire for Improvement  ADL's:  Intact  Cognition: WNL  Sleep:  Poor   Screenings: PHQ2-9     Office Visit from 11/26/2018 in [place] from [facility] Initial Prenatal from 07/02/2017 in [place] Office Visit from 05/29/2017 in [place] Office Visit from 01/29/2017 in [facility]  PHQ-2 Total Score  2  0  0  0  0  PHQ-9 Total Score  7  --  2  --  --       Assessment and Plan:  Michelle Page is a 44 y.o. year old female with a history of depression, breast feeding, who presents for follow up appointment for Moderate episode of recurrent major depressive disorder ([place]) - Plan: ARIPiprazole (ABILIFY) 5 MG tablet, buPROPion (WELLBUTRIN XL) 300 MG 24 hr tablet, sertraline (ZOLOFT) 100 MG tablet  # MDD, moderate, recurrent with psychotic features # r/o PTSD She continues to report exo dystonic delusion and hallucinations since the last visit, although there has been overall improvement in depressive symptoms since up titration of bupropion.  We will up titrate Abilify to target delusion and hallucinations.  Discussed potential risk of metabolic side effect and EPS.  We will continue sertraline to target depression.  We will continue bupropion as adjunctive treatment for depression.  We  will continue trazodone as needed for  insomnia.   # Insomnia Patient complains of middle insomnia. Discussed sleep hygiene. Will continue trazodone prn for insomnia.   Plan I have reviewed and updated plans as below 1. Continuesertraline200 mg daily 2. Continue bupropion 300 mg daily  3. Increase Abilify 7mg  daily  4.ContinueTrazodone50- 100mg  at night as needed for sleep 5. Next appointment:4/2 at 10 AM for 20 mins, video -TSH wnl checked by history  Past trials of medication:citalopram, bupropion, venlafaxine, carbamazepine, quetiapine (drowsiness), risperidone, Abilify  The patient demonstrates the following risk factors for suicide: Chronic risk factors for suicide include:psychiatric disorder ofdepression. Acute risk factorsfor suicide include: unemployment. Protective factorsfor this patient include: positive social support, responsibility to others (children, family), coping skills and hope for the future. Considering these factors, the overall suicide risk at this point appears to below. Patientisappropriate for outpatient follow up.   Norman Clay, MD 06/19/2019, 10:40 AM

## 2019-06-19 ENCOUNTER — Ambulatory Visit: Payer: Medicaid Other | Admitting: Psychiatry

## 2019-06-19 ENCOUNTER — Ambulatory Visit (INDEPENDENT_AMBULATORY_CARE_PROVIDER_SITE_OTHER): Payer: Medicaid Other | Admitting: Psychiatry

## 2019-06-19 ENCOUNTER — Encounter (HOSPITAL_COMMUNITY): Payer: Self-pay | Admitting: Psychiatry

## 2019-06-19 ENCOUNTER — Other Ambulatory Visit: Payer: Self-pay

## 2019-06-19 DIAGNOSIS — F331 Major depressive disorder, recurrent, moderate: Secondary | ICD-10-CM | POA: Diagnosis not present

## 2019-06-19 MED ORDER — ARIPIPRAZOLE 5 MG PO TABS: ORAL_TABLET | ORAL | 0 refills | Status: DC

## 2019-06-19 MED ORDER — ARIPIPRAZOLE 2 MG PO TABS: ORAL_TABLET | ORAL | 1 refills | Status: DC

## 2019-06-19 MED ORDER — SERTRALINE HCL 100 MG PO TABS: 200.0000 mg | ORAL_TABLET | Freq: Every day | ORAL | 0 refills | Status: DC

## 2019-06-19 MED ORDER — TRAZODONE HCL 150 MG PO TABS: 150.0000 mg | ORAL_TABLET | Freq: Every day | ORAL | 0 refills | Status: DC

## 2019-06-19 MED ORDER — BUPROPION HCL ER (XL) 300 MG PO TB24: 300.0000 mg | ORAL_TABLET | ORAL | 0 refills | Status: DC

## 2019-06-19 NOTE — Patient Instructions (Signed)
1.Continuesertraline200 mg daily 2. Continue bupropion 300 mg daily  3. Increase Abilify 7mg  daily 4.ContinueTrazodone50- 100mg  at night as needed for sleep 5. Next appointment:4/2 at 10 AM

## 2019-06-25 ENCOUNTER — Ambulatory Visit (INDEPENDENT_AMBULATORY_CARE_PROVIDER_SITE_OTHER): Payer: Medicaid Other | Admitting: Clinical

## 2019-06-25 ENCOUNTER — Other Ambulatory Visit: Payer: Self-pay

## 2019-06-25 DIAGNOSIS — F333 Major depressive disorder, recurrent, severe with psychotic symptoms: Secondary | ICD-10-CM
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
## 2019-07-20 ENCOUNTER — Ambulatory Visit (INDEPENDENT_AMBULATORY_CARE_PROVIDER_SITE_OTHER): Payer: Medicaid Other | Admitting: Clinical

## 2019-07-20 ENCOUNTER — Other Ambulatory Visit: Payer: Self-pay

## 2019-07-20 DIAGNOSIS — F333 Major depressive disorder, recurrent, severe with psychotic symptoms: Secondary | ICD-10-CM | POA: Diagnosis not present

## 2019-07-20 NOTE — Progress Notes (Signed)
Virtual Visit via Video Note  I connected with Michelle Page on 07/20/19 at  1:00 PM EDT by a video enabled telemedicine application and verified that I am speaking with the correct person using two identifiers.  Location: Patient: Home Provider: Office   I discussed the limitations of evaluation and management by telemedicine and the availability of in person appointments. The patient expressed understanding and agreed to proceed.     THERAPIST PROGRESS NOTE  Session Time: 1:00PM-1:40PM  Participation Level: Active  Behavioral Response: DisheveledAlertDepressed  Type of Therapy: Individual Therapy  Treatment Goals addressed: Coping  Interventions: CBT and Motivational Interviewing  Summary: Michelle Page is a 44 y.o. female who presents with Depression. The OPT therapist worked with the patient for her initial session. The OPT therapist utilized Motivational Interviewing to assist in creating therapeutic repore. The patient in the session was engaged and work in collaboration giving feedback about her triggers and symptoms over the past few weeks. The OPT therapist utilized Cognitive Behavioral Therapy through cognitive restructuring as well as worked with the patient on coping strategies to assist in management of mood. The OPT therapist inquired for holistic care about the patients adherence to medication therapy.  Suicidal/Homicidal: Nowithout intent/plan  Therapist Response: The OPT therapist worked with the patient for the patients initial scheduled session. The patient was engaged in her session and gave feedback in relation to triggers, symptoms, and behavior responses over the past few weeks. The OPT therapist worked with the patient utilizing an in session Cognitive Behavioral Therapy exercise. The patient was responsive in the session and verbalized, " I got up today and pushed to get my house clean even though I wanted to just go back to bed". The patient  indicated both compliance, but denied effectiveness in relation to her current medication therapy. The patient was encouraged to give through feedback in working in collaboration with her prescriber. The OPT therapist will continue treatment work with the patient in her next scheduled session.  Plan: Return again in 3 weeks.  Diagnosis: Axis I: Major depressive disorder, recurrent, severe with psychotic features    Axis II: No diagnosis  I discussed the assessment and treatment plan with the patient. The patient was provided an opportunity to ask questions and all were answered. The patient agreed with the plan and demonstrated an understanding of the instructions.   The patient was advised to call back or seek an in-person evaluation if the symptoms worsen or if the condition fails to improve as anticipated.  I provided 40 minutes of non-face-to-face time during this encounter.  Lennox Grumbles, LCSW 07/20/2019

## 2019-07-21 ENCOUNTER — Ambulatory Visit (INDEPENDENT_AMBULATORY_CARE_PROVIDER_SITE_OTHER): Payer: Medicaid Other | Admitting: Psychiatry

## 2019-07-21 ENCOUNTER — Other Ambulatory Visit: Payer: Self-pay

## 2019-07-21 ENCOUNTER — Encounter (HOSPITAL_COMMUNITY): Payer: Self-pay | Admitting: Psychiatry

## 2019-07-21 DIAGNOSIS — F331 Major depressive disorder, recurrent, moderate: Secondary | ICD-10-CM | POA: Diagnosis not present

## 2019-07-21 NOTE — Progress Notes (Signed)
Virtual Visit via Video Note  I connected with Michelle Page on 07/21/19 at  2:30 PM EDT by a video enabled telemedicine application and verified that I am speaking with the correct person using two identifiers.   I discussed the limitations of evaluation and management by telemedicine and the availability of in person appointments. The patient expressed understanding and agreed to proceed.     I discussed the assessment and treatment plan with the patient. The patient was provided an opportunity to ask questions and all were answered. The patient agreed with the plan and demonstrated an understanding of the instructions.   The patient was advised to call back or seek an in-person evaluation if the symptoms worsen or if the condition fails to improve as anticipated.  I provided 15 minutes of non-face-to-face time during this encounter.   Norman Clay, MD    [facility] MD/PA/NP OP Progress Note  07/21/2019 12:07 PM Michelle Page  MRN:  [id number]  Chief Complaint:  Chief Complaint    Depression; Follow-up     HPI:  This is a follow-up appointment for depression.  She states that she has been feeling fatigued, drained.  She spent time cleaning the house as she "wanted to feel better." She was told by her boyfriend that she overdoes thing, followed by crash. She talks about an example of being interested in [place]. She will read extensively about [place], and she bought snakes and other animals about a few months ago. Although she thought that was what she wanted to do at that time, she later regret it. She has been able to take care of her 42 year old child.  She has hypersomnia.  She feels fatigue.  She has difficulty in concentration.  She has anhedonia.  She denies SI.  She has AH of some voice. She denies CAH. She denies VH. She does not recall if she had paranoia/delusion (she previously reported that she thought her car was changed). She denies decreased need for sleep,  euphoria.  Visit Diagnosis:    ICD-10-CM   1. Moderate episode of recurrent major depressive disorder ([place])  F33.1     Past Psychiatric History: Please see initial evaluation for full details. I have reviewed the history. No updates at this time.     Past Medical History:  Past Medical History:  Diagnosis Date  . Abnormal Pap smear    colpo/leep  . Anemia   . Anxiety   . Cancer [facility]) 2006   Skin  . Chlamydia infection   . Depression   . Fractures   . Gestational diabetes    metformin  . H/O candidiasis   . H/O varicella   . Headache(784.0)    migraines as a child   . Heart murmur   . High risk HPV infection   . Obesity (BMI 30-39.9) 01/27/2019  . Papanicolaou smear of cervix with positive high risk human papilloma virus (HPV) test 06/03/2017  . Pregnancy induced hypertension    1st & 2nd pregnancy  . Pregnancy induced hypertension    1st & 2nd pregnancy   . Two vessel umbilical cord, antepartum 11/26/2011  . Vaginal Pap smear, abnormal   . Vitamin D deficiency disease 01/27/2019  . Yeast infection     Past Surgical History:  Procedure Laterality Date  . APPENDECTOMY  08/14/2017  . COLPOSCOPY    . LAPAROSCOPIC APPENDECTOMY N/A 08/14/2017   Procedure: APPENDECTOMY LAPAROSCOPIC;  Surgeon: Erroll Luna, MD;  Location: [place];  Service: General;  Laterality: N/A;  . LEEP    . TUBAL LIGATION N/A 01/22/2018   Procedure: POST PARTUM TUBAL LIGATION;  Surgeon: Gwynne Edinger, MD;  Location: [place];  Service: Gynecology;  Laterality: N/A;  . WISDOM TOOTH EXTRACTION      Family Psychiatric History: Please see initial evaluation for full details. I have reviewed the history. No updates at this time.     Family History:  Family History  Problem Relation Age of Onset  . Alcohol abuse Father   . Emphysema Father   . COPD Father   . Mental illness Father        PTSD  . Post-traumatic stress disorder Father   . Alcohol abuse Brother   . Stroke Brother   .  Cancer Paternal Grandmother        skin cancer  . Multiple sclerosis Sister   . Depression Mother   . Bipolar disorder Mother   . Anxiety disorder Mother   . Asthma Son   . Heart disease Maternal Grandfather     Social History:  Social History   Socioeconomic History  . Marital status: Significant Other    Spouse name: todd  . Number of children: 6  . Years of education: 45  . Highest education level: Not on file  Occupational History  . Occupation: unemployed    Comment: caregiver  Tobacco Use  . Smoking status: Former Smoker    Types: Cigarettes  . Smokeless tobacco: Never Used  . Tobacco comment: quit smoking in2007  Substance and Sexual Activity  . Alcohol use: No    Comment: occ before pregnancy  . Drug use: No  . Sexual activity: Yes    Birth control/protection: Surgical    Comment: tubal  Other Topics Concern  . Not on file  Social History Narrative   Divorced.Lives in home with 6 children and boyfriend of 4 years.   Unemployed.   Social Determinants of Health   Financial Resource Strain:   . Difficulty of Paying Living Expenses:   Food Insecurity:   . Worried About Charity fundraiser in the Last Year:   . Arboriculturist in the Last Year:   Transportation Needs:   . Film/video editor (Medical):   Marland Kitchen Lack of Transportation (Non-Medical):   Physical Activity:   . Days of Exercise per Week:   . Minutes of Exercise per Session:   Stress:   . Feeling of Stress :   Social Connections:   . Frequency of Communication with Friends and Family:   . Frequency of Social Gatherings with Friends and Family:   . Attends Religious Services:   . Active Member of Clubs or Organizations:   . Attends Archivist Meetings:   Marland Kitchen Marital Status:     Allergies: No Known Allergies  Metabolic Disorder Labs: No results found for: HGBA1C, MPG No results found for: PROLACTIN Lab Results  Component Value Date   CHOL 148 01/29/2017   TRIG 115 01/29/2017    HDL 45 (L) 01/29/2017   CHOLHDL 3.3 01/29/2017   LDLCALC 82 01/29/2017   Lab Results  Component Value Date   TSH 2.13 01/27/2019   TSH 2.21 01/29/2017    Therapeutic Level Labs: No results found for: LITHIUM No results found for: VALPROATE No components found for:  CBMZ  Current Medications: Current Outpatient Medications  Medication Sig Dispense Refill  . ARIPiprazole (ABILIFY) 2 MG tablet Take total of 7 mg (along with 5 mg)  daily 30 tablet 1  . [START ON 07/29/2019] ARIPiprazole (ABILIFY) 5 MG tablet Take total of 7 mg (along with 2 mg) daily 90 tablet 0  . [START ON 07/29/2019] buPROPion (WELLBUTRIN XL) 300 MG 24 hr tablet Take 1 tablet (300 mg total) by mouth every morning. 90 tablet 0  . [START ON 07/29/2019] sertraline (ZOLOFT) 100 MG tablet Take 2 tablets (200 mg total) by mouth daily. 180 tablet 0  . [START ON 07/29/2019] traZODone (DESYREL) 150 MG tablet Take 1 tablet (150 mg total) by mouth at bedtime. 90 tablet 0   No current facility-administered medications for this visit.     Musculoskeletal: Strength & Muscle Tone: N/A Gait & Station: N/A Patient leans: N/A  Psychiatric Specialty Exam: Review of Systems  Psychiatric/Behavioral: Positive for decreased concentration, dysphoric mood, hallucinations and sleep disturbance. Negative for agitation, behavioral problems, confusion, self-injury and suicidal ideas. The patient is nervous/anxious. The patient is not hyperactive.   All other systems reviewed and are negative.   currently breastfeeding.There is no height or weight on file to calculate BMI.  General Appearance: Fairly Groomed  Eye Contact:  Good  Speech:  Clear and Coherent  Volume:  Normal  Mood:  "tired"  Affect:  Appropriate, Congruent, Restricted and down  Thought Process:  Coherent  Orientation:  Full (Time, Place, and Person)  Thought Content: Logical   Suicidal Thoughts:  No  Homicidal Thoughts:  No  Memory:  Immediate;   Good  Judgement:  Good   Insight:  Good  Psychomotor Activity:  Normal  Concentration:  Concentration: Good and Attention Span: Good  Recall:  Good  Fund of Knowledge: Good  Language: Good  Akathisia:  No  Handed:  Right  AIMS (if indicated): not done  Assets:  Communication Skills Desire for Improvement  ADL's:  Intact  Cognition: WNL  Sleep:  Poor   Screenings: PHQ2-9     Office Visit from 11/26/2018 in [place] [facility] Initial Prenatal from 07/02/2017 in [place] Office Visit from 05/29/2017 in [place] Office Visit from 01/29/2017 in [facility]  PHQ-2 Total Score  2  0  0  0  0  PHQ-9 Total Score  7  --  2  --  --       Assessment and Plan:  Michelle Page is a 44 y.o. year old female with a history of depression, , who presents for follow up appointment for Moderate episode of recurrent major depressive disorder ([place])  # MDD, moderate, recurrent with psychotic features # r/o mixed features # r/o PTSD She reports significant fatigue and subthreshold hypomanic symptoms, although there has been slight improvement in ego dystonic delusion/hallucinations since up titration of Abilify.  Will do further up titration of Abilify as adjunctive treatment for depression and also to target delusion, hallucinations and subthreshold hypomanic symptoms.  Discussed risk of metabolic side effect and EPS.  We will continue sertraline to target depression.  Will continue bupropion as adjunctive treatment for depression.  We will continue trazodone as needed for insomnia.   Plan I have reviewed and updated plans as below 1.Continuesertraline200 mg daily 2. Continue bupropion 300 mg daily  3. Increase Abilify 10mg  daily 4.ContinueTrazodone50- 100mg  at night as needed for sleep 5. Next appointment:4/21 at 10:20 for 20 mins, video -TSH wnl checked by history  Past trials of  medication:citalopram, bupropion, venlafaxine, carbamazepine, quetiapine (drowsiness), risperidone, Abilify  The patient demonstrates  the following risk factors for suicide: Chronic risk factors for suicide include:psychiatric disorder ofdepression. Acute risk factorsfor suicide include: unemployment. Protective factorsfor this patient include: positive social support, responsibility to others (children, family), coping skills and hope for the future. Considering these factors, the overall suicide risk at this point appears to below. Patientisappropriate for outpatient follow up.   Norman Clay, MD 07/21/2019, 12:07 PM

## 2019-07-21 NOTE — Patient Instructions (Signed)
1.Continuesertraline200 mg daily 2.Continuebupropion 300 mg daily  3.Increase Abilify 10mg  daily 4.ContinueTrazodone50- 100mg  at night as needed for sleep 5. Next appointment:4/21 at 10:20

## 2019-07-22 ENCOUNTER — Ambulatory Visit (HOSPITAL_COMMUNITY): Payer: Medicaid Other | Admitting: Psychiatry

## 2019-07-31 ENCOUNTER — Ambulatory Visit (HOSPITAL_COMMUNITY): Payer: Medicaid Other | Admitting: Psychiatry

## 2019-08-04 ENCOUNTER — Ambulatory Visit (HOSPITAL_COMMUNITY): Payer: Medicaid Other | Admitting: Psychiatry

## 2019-08-12 NOTE — Progress Notes (Signed)
[facility] MD/PA/NP OP Progress Note  08/19/2019 11:24 AM Michelle Page  MRN:  [id number]  Chief Complaint:  Chief Complaint    Depression; Follow-up     HPI:  This is a follow-up appointment for depression.  She states that she has not noticed much change since up titration of Abilify.  She is having insomnia, and she has an episode of "black out." She feels that everything is dark, although she denies any loss of consciousness.  She experiences it for a long time.  She continues to feel fatigue and has anhedonia.  She has been taking care of her children, who is attending school online.  She was able to clean the house with the help of her daughter as her child will have a play date today.  She feels depressed.  She has difficulty in concentration.  She notices weight gain.  She denies SI.  She feels anxious and tense.  She denies decreased need for sleep.  She had an episode of feeling energized and "ready to go" at night. She impulsively rode roller skate when her son had roller skate party, although she thinks she is old to do it. She feels that somebody is touching her. She has less AH of hearing her boyfriend's voice, phone ringing. She has ego dystonic ideas of reference when she is in public. She has VH of shadow.   220 lbs Wt Readings from Last 3 Encounters:  01/27/19 193 lb 3.2 oz (87.6 kg)  11/26/18 192 lb (87.1 kg)  02/27/18 196 lb (88.9 kg)    Visit Diagnosis:    ICD-10-CM   1. Moderate episode of recurrent major depressive disorder (HCC)  F33.1 buPROPion (WELLBUTRIN XL) 300 MG 24 hr tablet    sertraline (ZOLOFT) 100 MG tablet  2. Insomnia, unspecified type  G47.00     Past Psychiatric History: Please see initial evaluation for full details. I have reviewed the history. No updates at this time.     Past Medical History:  Past Medical History:  Diagnosis Date  . Abnormal Pap smear    colpo/leep  . Anemia   . Anxiety   . Cancer [facility]) 2006   Skin  . Chlamydia infection    . Depression   . Fractures   . Gestational diabetes    metformin  . H/O candidiasis   . H/O varicella   . Headache(784.0)    migraines as a child   . Heart murmur   . High risk HPV infection   . Obesity (BMI 30-39.9) 01/27/2019  . Papanicolaou smear of cervix with positive high risk human papilloma virus (HPV) test 06/03/2017  . Pregnancy induced hypertension    1st & 2nd pregnancy  . Pregnancy induced hypertension    1st & 2nd pregnancy   . Two vessel umbilical cord, antepartum 11/26/2011  . Vaginal Pap smear, abnormal   . Vitamin D deficiency disease 01/27/2019  . Yeast infection     Past Surgical History:  Procedure Laterality Date  . APPENDECTOMY  08/14/2017  . COLPOSCOPY    . LAPAROSCOPIC APPENDECTOMY N/A 08/14/2017   Procedure: APPENDECTOMY LAPAROSCOPIC;  Surgeon: Erroll Luna, MD;  Location: [place];  Service: General;  Laterality: N/A;  . LEEP    . TUBAL LIGATION N/A 01/22/2018   Procedure: POST PARTUM TUBAL LIGATION;  Surgeon: Gwynne Edinger, MD;  Location: [place];  Service: Gynecology;  Laterality: N/A;  . WISDOM TOOTH EXTRACTION      Family Psychiatric History: Please see  initial evaluation for full details. I have reviewed the history. No updates at this time.     Family History:  Family History  Problem Relation Age of Onset  . Alcohol abuse Father   . Emphysema Father   . COPD Father   . Mental illness Father        PTSD  . Post-traumatic stress disorder Father   . Alcohol abuse Brother   . Stroke Brother   . Cancer Paternal Grandmother        skin cancer  . Multiple sclerosis Sister   . Depression Mother   . Bipolar disorder Mother   . Anxiety disorder Mother   . Asthma Son   . Heart disease Maternal Grandfather     Social History:  Social History   Socioeconomic History  . Marital status: Significant Other    Spouse name: todd  . Number of children: 6  . Years of education: 42  . Highest education level: Not on file   Occupational History  . Occupation: unemployed    Comment: caregiver  Tobacco Use  . Smoking status: Former Smoker    Types: Cigarettes  . Smokeless tobacco: Never Used  . Tobacco comment: quit smoking in2007  Substance and Sexual Activity  . Alcohol use: No    Comment: occ before pregnancy  . Drug use: No  . Sexual activity: Yes    Birth control/protection: Surgical    Comment: tubal  Other Topics Concern  . Not on file  Social History Narrative   Divorced.Lives in home with 6 children and boyfriend of 4 years.   Unemployed.   Social Determinants of Health   Financial Resource Strain:   . Difficulty of Paying Living Expenses:   Food Insecurity:   . Worried About Charity fundraiser in the Last Year:   . Arboriculturist in the Last Year:   Transportation Needs:   . Film/video editor (Medical):   Marland Kitchen Lack of Transportation (Non-Medical):   Physical Activity:   . Days of Exercise per Week:   . Minutes of Exercise per Session:   Stress:   . Feeling of Stress :   Social Connections:   . Frequency of Communication with Friends and Family:   . Frequency of Social Gatherings with Friends and Family:   . Attends Religious Services:   . Active Member of Clubs or Organizations:   . Attends Archivist Meetings:   Marland Kitchen Marital Status:     Allergies: No Known Allergies  Metabolic Disorder Labs: No results found for: HGBA1C, MPG No results found for: PROLACTIN Lab Results  Component Value Date   CHOL 148 01/29/2017   TRIG 115 01/29/2017   HDL 45 (L) 01/29/2017   CHOLHDL 3.3 01/29/2017   LDLCALC 82 01/29/2017   Lab Results  Component Value Date   TSH 2.13 01/27/2019   TSH 2.21 01/29/2017    Therapeutic Level Labs: No results found for: LITHIUM No results found for: VALPROATE No components found for:  CBMZ  Current Medications: Current Outpatient Medications  Medication Sig Dispense Refill  . [START ON 09/16/2019] buPROPion (WELLBUTRIN XL) 300 MG 24  hr tablet Take 1 tablet (300 mg total) by mouth every morning. 90 tablet 0  . [START ON 09/16/2019] sertraline (ZOLOFT) 100 MG tablet Take 2 tablets (200 mg total) by mouth daily. 180 tablet 0  . [START ON 09/16/2019] traZODone (DESYREL) 150 MG tablet Take 1 tablet (150 mg total) by mouth at bedtime.  90 tablet 0  . ziprasidone (GEODON) 20 MG capsule Take 1 capsule (20 mg total) by mouth 2 (two) times daily with a meal. 60 capsule 1  . zolpidem (AMBIEN) 5 MG tablet Take 1 tablet (5 mg total) by mouth at bedtime as needed for sleep. 30 tablet 1   No current facility-administered medications for this visit.     Musculoskeletal: Strength & Muscle Tone: N/A Gait & Station: N/A Patient leans: N/A  Psychiatric Specialty Exam: Review of Systems  Psychiatric/Behavioral: Positive for decreased concentration, dysphoric mood, hallucinations and sleep disturbance. Negative for agitation, behavioral problems, confusion, self-injury and suicidal ideas. The patient is nervous/anxious. The patient is not hyperactive.   All other systems reviewed and are negative.   currently breastfeeding.There is no height or weight on file to calculate BMI.  General Appearance: Fairly Groomed  Eye Contact:  Good  Speech:  Clear and Coherent  Volume:  Normal  Mood:  fatigue  Affect:  Appropriate, Congruent and Restricted  Thought Process:  Coherent  Orientation:  Full (Time, Place, and Person)  Thought Content: Logical   Suicidal Thoughts:  No  Homicidal Thoughts:  No  Memory:  Immediate;   Good  Judgement:  Good  Insight:  Fair  Psychomotor Activity:  Normal  Concentration:  Concentration: Good and Attention Span: Good  Recall:  Good  Fund of Knowledge: Good  Language: Good  Akathisia:  No  Handed:  Right  AIMS (if indicated): not done  Assets:  Communication Skills  ADL's:  Intact  Cognition: WNL  Sleep:  Poor   Screenings: PHQ2-9     Office Visit from 11/26/2018 in [facility] [place]  from [facility] Initial Prenatal from 07/02/2017 in [place] Office Visit from 05/29/2017 in [facility] Visit from 01/29/2017 in [facility]  PHQ-2 Total Score  2  0  0  0  0  PHQ-9 Total Score  7  --  2  --  --       Assessment and Plan:  Michelle Page is a 44 y.o. year old female with a history of depression, who presents for follow up appointment for Moderate episode of recurrent major depressive disorder ([place]) - Plan: buPROPion (WELLBUTRIN XL) 300 MG 24 hr tablet, sertraline (ZOLOFT) 100 MG tablet  Insomnia, unspecified type  # MDD, moderate, recurrent without psychotic features # r/o mixed features # r/o PTSD Although there has been improvement in hallucinations since up titration of Abilify , she has had significant weight gain and has limited benefit for her mood symptoms . Will switch from Abilify to Geodon to target hallucinations, ideas of reference and as adjunctive treatment for depression.  Discussed potential risk of EPS and metabolic side effect.  We will continue sertraline to target depression.  We will continue bupropion as adjunctive treatment for depression.   # Insomnia She reports initial and middle insomnia, although there is slight improvement with Trazodone. Will start Ambien as needed for insomnia.   Plan 1.Continuesertraline200 mg daily 2.Continuebupropion 300 mg daily  3. Discontinue Abilify  4. Start Geodon 20 mg twice a day  5. Start Ambien 5 mg at night   6.ContinueTrazodone150 mg at night as needed for sleep 7. Next appointment:5/28 at 10:20 for 20 mins, video -TSH wnl checked by history  Past trials of medication:citalopram, bupropion, venlafaxine, carbamazepine, quetiapine (drowsiness), risperidone, Abilify *weight gain  The patient demonstrates the following risk factors for suicide: Chronic  risk factors for suicide include:psychiatric disorder  ofdepression. Acute risk factorsfor suicide include: unemployment. Protective factorsfor this patient include: positive social support, responsibility to others (children, family), coping skills and hope for the future. Considering these factors, the overall suicide risk at this point appears to below. Patientisappropriate for outpatient follow up.    Norman Clay, MD 08/19/2019, 11:24 AM

## 2019-08-17 ENCOUNTER — Telehealth (HOSPITAL_COMMUNITY): Payer: Self-pay

## 2019-08-17 NOTE — Telephone Encounter (Signed)
Opened in error

## 2019-08-19 ENCOUNTER — Telehealth (HOSPITAL_COMMUNITY): Payer: Self-pay | Admitting: *Deleted

## 2019-08-19 ENCOUNTER — Telehealth (INDEPENDENT_AMBULATORY_CARE_PROVIDER_SITE_OTHER): Payer: Medicaid Other | Admitting: Psychiatry

## 2019-08-19 ENCOUNTER — Encounter (HOSPITAL_COMMUNITY): Payer: Self-pay | Admitting: Psychiatry

## 2019-08-19 ENCOUNTER — Encounter (HOSPITAL_COMMUNITY): Payer: Self-pay

## 2019-08-19 ENCOUNTER — Other Ambulatory Visit: Payer: Self-pay

## 2019-08-19 DIAGNOSIS — F331 Major depressive disorder, recurrent, moderate: Secondary | ICD-10-CM

## 2019-08-19 DIAGNOSIS — G4700 Insomnia, unspecified: Secondary | ICD-10-CM

## 2019-08-19 DIAGNOSIS — F440 Dissociative amnesia: Secondary | ICD-10-CM

## 2019-08-19 DIAGNOSIS — F4310 Post-traumatic stress disorder, unspecified: Secondary | ICD-10-CM

## 2019-08-19 MED ORDER — BUPROPION HCL ER (XL) 300 MG PO TB24: 300.0000 mg | ORAL_TABLET | ORAL | 0 refills | Status: DC

## 2019-08-19 MED ORDER — ZIPRASIDONE HCL 20 MG PO CAPS: 20.0000 mg | ORAL_CAPSULE | Freq: Two times a day (BID) | ORAL | 1 refills | Status: DC

## 2019-08-19 MED ORDER — TRAZODONE HCL 150 MG PO TABS: 150.0000 mg | ORAL_TABLET | Freq: Every day | ORAL | 0 refills | Status: DC

## 2019-08-19 MED ORDER — SERTRALINE HCL 100 MG PO TABS: 200.0000 mg | ORAL_TABLET | Freq: Every day | ORAL | 0 refills | Status: DC

## 2019-08-19 MED ORDER — ZOLPIDEM TARTRATE 5 MG PO TABS: 5.0000 mg | ORAL_TABLET | Freq: Every evening | ORAL | 1 refills | Status: DC | PRN

## 2019-08-19 NOTE — Telephone Encounter (Signed)
[place] TRACKS PRIOR AUTHORIZATION APPROVED ::  ziprasidone (GEODON) 20 MG capsule  P.A. # :: [id number]            EFFECTIVE:  08/19/2019     THRU    08/13/2020

## 2019-08-19 NOTE — Patient Instructions (Signed)
1.Continuesertraline200 mg daily 2.Continuebupropion 300 mg daily  3. Discontinue Abilify  4. Start geodon 20 mg twice a day  5. Start Ambien 5 mg at night  6.ContinueTrazodone150 mg at night as needed for sleep 7. Next appointment:5/28 at 10:20

## 2019-08-27 ENCOUNTER — Telehealth (HOSPITAL_COMMUNITY): Payer: Medicaid Other | Admitting: Clinical

## 2019-09-08 ENCOUNTER — Other Ambulatory Visit: Payer: Self-pay

## 2019-09-08 ENCOUNTER — Ambulatory Visit (INDEPENDENT_AMBULATORY_CARE_PROVIDER_SITE_OTHER): Payer: Medicaid Other | Admitting: Clinical

## 2019-09-08 DIAGNOSIS — F333 Major depressive disorder, recurrent, severe with psychotic symptoms: Secondary | ICD-10-CM

## 2019-09-08 NOTE — Progress Notes (Signed)
  Virtual Visit via Video Note  I connected withTuesday PAVANI Page on 09/08/19 at  10:00 AM EDT by a video enabled telemedicine application and verified that I am speaking with the correct person using two identifiers.  Location: Patient: Home Provider: Office  I discussed the limitations of evaluation and management by telemedicine and the availability of in person appointments. The patient expressed understanding and agreed to proceed.     THERAPIST PROGRESS NOTE  Session Time: 10:00AM-10:40AM  Participation Level: Active  Behavioral Response: DisheveledAlertDepressed  Type of Therapy: Individual Therapy  Treatment Goals addressed: Coping  Interventions: CBT and Motivational Interviewing  Summary: Michelle Page is a 44 y.o. female who presents with Depression. The OPT therapist worked with the patient for her ongoing [place]. The OPT therapist utilized Motivational Interviewing to assist in creating therapeutic repore. The patient in the session was engaged and work in collaboration giving feedback about her triggers and symptoms over the past few weeks. The patient remains symptomatic, however, continues to work on self awareness and implementation of coping. The OPT therapist utilized Cognitive Behavioral Therapy through cognitive restructuring as well as worked with the patient on coping strategies to assist in management of symptoms. The OPT therapist inquired for holistic care about the patients adherence to medication therapy.  Suicidal/Homicidal: Nowithout intent/plan  Therapist Response: The OPT therapist worked with the patient for the patients scheduled session. The patient was engaged in her session and gave feedback in relation to triggers, symptoms, and behavior responses over the past few weeks. The OPT therapist worked with the patient utilizing an in session Cognitive Behavioral Therapy exercise. The patient was responsive in the session and  verbalized, " I am working to be more active, I have been helping with homework, I am looking to take the kids to the [place] in [place] on Friday". The patient indicated both compliance, but denied effectiveness in relation to her current medication therapy. The OPT therapist will continue treatment work with the patient in her next scheduled session.  Plan: Return again in 2/3 weeks.  Diagnosis:      Axis I: Major depressive disorder, recurrent, severe with psychotic features                          Axis II: No diagnosis  I discussed the assessment and treatment plan with the patient. The patient was provided an opportunity to ask questions and all were answered. The patient agreed with the plan and demonstrated an understanding of the instructions.  The patient was advised to call back or seek an in-person evaluation if the symptoms worsen or if the condition fails to improve as anticipated.  I provided 40 minutes of non-face-to-face time during this encounter.  Lennox Grumbles, LCSW 09/08/2019

## 2019-09-22 NOTE — Progress Notes (Signed)
Virtual Visit via Video Note  I connected with Michelle Page on 09/25/19 at 11:00 AM EDT by a video enabled telemedicine application and verified that I am speaking with the correct person using two identifiers.   I discussed the limitations of evaluation and management by telemedicine and the availability of in person appointments. The patient expressed understanding and agreed to proceed.  Location: patient- home, provider- office     I discussed the assessment and treatment plan with the patient. The patient was provided an opportunity to ask questions and all were answered. The patient agreed with the plan and demonstrated an understanding of the instructions.   The patient was advised to call back or seek an in-person evaluation if the symptoms worsen or if the condition fails to improve as anticipated.  I provided 12 minutes of non-face-to-face time during this encounter.   Norman Clay, MD    [facility] MD/PA/NP OP Progress Note  09/25/2019 11:54 AM Michelle Page  MRN:  [id number]  Chief Complaint:  Chief Complaint    Follow-up; Depression     HPI:  This is a follow-up appointment for depression and insomnia.  She states that she feels different in a positive way after switching from Abilify to Geodon, although she is unable to elaborate it. She enjoys being with her daughter. She enjoyed painting with her yesterday. Although she still feels that her boyfriend is cheating, and feels it is true in the moment, she is later able to tell that it is not true. She has AH/VH of lady/her boyfriend when she has that feeling. She sleeps better after starting Ambien. She has fair concentration.  Although she does not have a scale, she feels that her clothes is tighter. She agrees to measure her weight to monitor it. She has fair motivation and energy. She denies SI. She denies decreased need for sleep or euphoria.     Visit Diagnosis:    ICD-10-CM   1. Mild episode of recurrent  major depressive disorder ([place])  F33.0   2. Insomnia, unspecified type  G47.00     Past Psychiatric History: Please see initial evaluation for full details. I have reviewed the history. No updates at this time.     Past Medical History:  Past Medical History:  Diagnosis Date  . Abnormal Pap smear    colpo/leep  . Anemia   . Anxiety   . Cancer [facility]) 2006   Skin  . Chlamydia infection   . Depression   . Fractures   . Gestational diabetes    metformin  . H/O candidiasis   . H/O varicella   . Headache(784.0)    migraines as a child   . Heart murmur   . High risk HPV infection   . Obesity (BMI 30-39.9) 01/27/2019  . Papanicolaou smear of cervix with positive high risk human papilloma virus (HPV) test 06/03/2017  . Pregnancy induced hypertension    1st & 2nd pregnancy  . Pregnancy induced hypertension    1st & 2nd pregnancy   . Two vessel umbilical cord, antepartum 11/26/2011  . Vaginal Pap smear, abnormal   . Vitamin D deficiency disease 01/27/2019  . Yeast infection     Past Surgical History:  Procedure Laterality Date  . APPENDECTOMY  08/14/2017  . COLPOSCOPY    . LAPAROSCOPIC APPENDECTOMY N/A 08/14/2017   Procedure: APPENDECTOMY LAPAROSCOPIC;  Surgeon: Erroll Luna, MD;  Location: [place];  Service: General;  Laterality: N/A;  . LEEP    .  TUBAL LIGATION N/A 01/22/2018   Procedure: POST PARTUM TUBAL LIGATION;  Surgeon: Gwynne Edinger, MD;  Location: [place];  Service: Gynecology;  Laterality: N/A;  . WISDOM TOOTH EXTRACTION      Family Psychiatric History: Please see initial evaluation for full details. I have reviewed the history. No updates at this time.     Family History:  Family History  Problem Relation Age of Onset  . Alcohol abuse Father   . Emphysema Father   . COPD Father   . Mental illness Father        PTSD  . Post-traumatic stress disorder Father   . Alcohol abuse Brother   . Stroke Brother   . Cancer Paternal Grandmother         skin cancer  . Multiple sclerosis Sister   . Depression Mother   . Bipolar disorder Mother   . Anxiety disorder Mother   . Asthma Son   . Heart disease Maternal Grandfather     Social History:  Social History   Socioeconomic History  . Marital status: Significant Other    Spouse name: todd  . Number of children: 6  . Years of education: 57  . Highest education level: Not on file  Occupational History  . Occupation: unemployed    Comment: caregiver  Tobacco Use  . Smoking status: Former Smoker    Types: Cigarettes  . Smokeless tobacco: Never Used  . Tobacco comment: quit smoking in2007  Substance and Sexual Activity  . Alcohol use: No    Comment: occ before pregnancy  . Drug use: No  . Sexual activity: Yes    Birth control/protection: Surgical    Comment: tubal  Other Topics Concern  . Not on file  Social History Narrative   Divorced.Lives in home with 6 children and boyfriend of 4 years.   Unemployed.   Social Determinants of Health   Financial Resource Strain:   . Difficulty of Paying Living Expenses:   Food Insecurity:   . Worried About Charity fundraiser in the Last Year:   . Arboriculturist in the Last Year:   Transportation Needs:   . Film/video editor (Medical):   Marland Kitchen Lack of Transportation (Non-Medical):   Physical Activity:   . Days of Exercise per Week:   . Minutes of Exercise per Session:   Stress:   . Feeling of Stress :   Social Connections:   . Frequency of Communication with Friends and Family:   . Frequency of Social Gatherings with Friends and Family:   . Attends Religious Services:   . Active Member of Clubs or Organizations:   . Attends Archivist Meetings:   Marland Kitchen Marital Status:     Allergies: No Known Allergies  Metabolic Disorder Labs: No results found for: HGBA1C, MPG No results found for: PROLACTIN Lab Results  Component Value Date   CHOL 148 01/29/2017   TRIG 115 01/29/2017   HDL 45 (L) 01/29/2017   CHOLHDL  3.3 01/29/2017   LDLCALC 82 01/29/2017   Lab Results  Component Value Date   TSH 2.13 01/27/2019   TSH 2.21 01/29/2017    Therapeutic Level Labs: No results found for: LITHIUM No results found for: VALPROATE No components found for:  CBMZ  Current Medications: Current Outpatient Medications  Medication Sig Dispense Refill  . buPROPion (WELLBUTRIN XL) 300 MG 24 hr tablet Take 1 tablet (300 mg total) by mouth every morning. 90 tablet 0  .  sertraline (ZOLOFT) 100 MG tablet Take 2 tablets (200 mg total) by mouth daily. 180 tablet 0  . traZODone (DESYREL) 150 MG tablet Take 1 tablet (150 mg total) by mouth at bedtime. 90 tablet 0  . [START ON 10/18/2019] ziprasidone (GEODON) 20 MG capsule Take 1 capsule (20 mg total) by mouth 2 (two) times daily with a meal. 60 capsule 1  . zolpidem (AMBIEN) 5 MG tablet Take 1 tablet (5 mg total) by mouth at bedtime as needed for sleep. 30 tablet 1   No current facility-administered medications for this visit.     Musculoskeletal: Strength & Muscle Tone: N/A Gait & Station: N/A Patient leans: N/A  Psychiatric Specialty Exam: Review of Systems  Psychiatric/Behavioral: Positive for dysphoric mood. Negative for agitation, behavioral problems, confusion, decreased concentration, hallucinations, self-injury, sleep disturbance and suicidal ideas. The patient is not nervous/anxious and is not hyperactive.   All other systems reviewed and are negative.   currently breastfeeding.There is no height or weight on file to calculate BMI.  General Appearance: Fairly Groomed  Eye Contact:  Good  Speech:  Clear and Coherent  Volume:  Normal  Mood:  good  Affect:  Appropriate, Congruent and less fatigue  Thought Process:  Coherent  Orientation:  Full (Time, Place, and Person)  Thought Content: Logical   Suicidal Thoughts:  No  Homicidal Thoughts:  No  Memory:  Immediate;   Good  Judgement:  Good  Insight:  Fair  Psychomotor Activity:  Normal   Concentration:  Concentration: Good and Attention Span: Good  Recall:  Good  Fund of Knowledge: Good  Language: Good  Akathisia:  No  Handed:  Right  AIMS (if indicated): not done  Assets:  Communication Skills Desire for Improvement  ADL's:  Intact  Cognition: WNL  Sleep:  Fair   Screenings: PHQ2-9     Office Visit from 11/26/2018 in [place] [facility] Initial Prenatal from 07/02/2017 in [place] Office Visit from 05/29/2017 in [place] Office Visit from 01/29/2017 in [facility]  PHQ-2 Total Score  2  0  0  0  0  PHQ-9 Total Score  7  --  2  --  --       Assessment and Plan:  Michelle Page is a 44 y.o. year old female with a history of  depression, who presents for follow up appointment for below.   1. Mild episode of recurrent major depressive disorder ([place]) # r/o mixed features # r/o PTSD There has been overall improvement in depressive symptoms since switching from Abilify to Geodon.  We will continue current dose of Geodon as adjunctive treatment for depression, hallucinations and ego dystonic paranoia.  She is advised to monitor her weight.  Will continue sertraline to target depression.  We will continue bupropion as adjunctive treatment for depression.   2. Insomnia, unspecified type She reports improvement in insomnia after starting Ambien.  Will continue the current dose to target insomnia.  Will continue trazodone as needed for insomnia given she is only able to take Ambien every other day due to insurance issues.   Plan 1.Continuesertraline200 mg daily 2.Continuebupropion 300 mg daily  3. Continue Geodon 20 mg twice a day - monitor weight gain 5. Continue Ambien 5 mg at night  15 tabs per month due to insurance restriction 6.ContinueTrazodone150 mg at night as needed for sleep 7. Next appointment:7/16 at 11AM for 30 mins, video -TSH  wnl checked  by history  Past trials of medication:citalopram, bupropion, venlafaxine, carbamazepine, quetiapine (drowsiness), risperidone, Abilify(weight gain)  The patient demonstrates the following risk factors for suicide: Chronic risk factors for suicide include:psychiatric disorder ofdepression. Acute risk factorsfor suicide include: unemployment. Protective factorsfor this patient include: positive social support, responsibility to others (children, family), coping skills and hope for the future. Considering these factors, the overall suicide risk at this point appears to below. Patientisappropriate for outpatient follow up.   Norman Clay, MD 09/25/2019, 11:54 AM

## 2019-09-25 ENCOUNTER — Telehealth (HOSPITAL_COMMUNITY): Payer: Medicaid Other | Admitting: Psychiatry

## 2019-09-25 ENCOUNTER — Telehealth (INDEPENDENT_AMBULATORY_CARE_PROVIDER_SITE_OTHER): Payer: Medicaid Other | Admitting: Psychiatry

## 2019-09-25 ENCOUNTER — Encounter (HOSPITAL_COMMUNITY): Payer: Self-pay | Admitting: Psychiatry

## 2019-09-25 ENCOUNTER — Other Ambulatory Visit: Payer: Self-pay

## 2019-09-25 DIAGNOSIS — F330 Major depressive disorder, recurrent, mild: Secondary | ICD-10-CM | POA: Diagnosis not present

## 2019-09-25 DIAGNOSIS — G4700 Insomnia, unspecified: Secondary | ICD-10-CM

## 2019-09-25 MED ORDER — ZIPRASIDONE HCL 20 MG PO CAPS: 20.0000 mg | ORAL_CAPSULE | Freq: Two times a day (BID) | ORAL | 1 refills | Status: DC

## 2019-09-25 NOTE — Patient Instructions (Signed)
1.Continuesertraline200 mg daily 2.Continuebupropion 300 mg daily  3. Continue Geodon 20 mg twice a day  5. Continue Ambien 5 mg at night  15 tabs per month  6.ContinueTrazodone150 mg at night as needed for sleep 7. Next appointment:7/16 at [place]

## 2019-11-11 NOTE — Progress Notes (Signed)
Virtual Visit via Video Note  I connected with Michelle Page on 11/13/19 at 11:00 AM EDT by a video enabled telemedicine application and verified that I am speaking with the correct person using two identifiers.   I discussed the limitations of evaluation and management by telemedicine and the availability of in person appointments. The patient expressed understanding and agreed to proceed.     I discussed the assessment and treatment plan with the patient. The patient was provided an opportunity to ask questions and all were answered. The patient agreed with the plan and demonstrated an understanding of the instructions.   The patient was advised to call back or seek an in-person evaluation if the symptoms worsen or if the condition fails to improve as anticipated.  Location: patient- home, provider- home office   I provided 18 minutes of non-face-to-face time during this encounter.   Norman Clay, MD    [facility] MD/PA/NP OP Progress Note  11/13/2019 11:31 AM [place] Michelle Page  MRN:  [id number]  Chief Complaint:  Chief Complaint    Follow-up; Depression; Paranoid     HPI:  This is a follow up appointment for depression and hallucinations.  She states that she still hears a girls voice. She insisted the other day that this girl was out there cheating. Although her boyfriend tries to reassure her, it is difficult for the patient to believe in it. She also felt somebody is in attic, although nobody was there. She has AH of woman talking, although she denies CAH. She reports good relationship with her boyfriend otherwise. She talks about her 42 year old daughter, who has started to date with a boyfriend. Although she was stressed to clean the room and be ready for her daughters friends to come over to the house, she was able to do it with the help of her children. She feels stressed due to financial strain since her husband is not self employed anymore due to his partner being unable to  continue the work. She has insomnia. She feels that medication is not working as much compared to before. She feels fatigue. She denies feeling depressed. She has difficulty in concentration and is forgetful. She has fair motivation. She denies SI. She feels anxious, tense, and has occasional panic attacks. She denies decreased need for sleep or euphoria. Although she thinks her pants are getting tight again, she has not measured her body weight.    Visit Diagnosis:    ICD-10-CM   1. Mild episode of recurrent major depressive disorder ([place])  F33.0   2. Insomnia, unspecified type  G47.00     Past Psychiatric History:  Please see initial evaluation for full details. I have reviewed the history. No updates at this time.     Past Medical History:  Past Medical History:  Diagnosis Date  . Abnormal Pap smear    colpo/leep  . Anemia   . Anxiety   . Cancer [facility]) 2006   Skin  . Chlamydia infection   . Depression   . Fractures   . Gestational diabetes    metformin  . H/O candidiasis   . H/O varicella   . Headache(784.0)    migraines as a child   . Heart murmur   . High risk HPV infection   . Obesity (BMI 30-39.9) 01/27/2019  . Papanicolaou smear of cervix with positive high risk human papilloma virus (HPV) test 06/03/2017  . Pregnancy induced hypertension    1st & 2nd pregnancy  . Pregnancy  induced hypertension    1st & 2nd pregnancy   . Two vessel umbilical cord, antepartum 11/26/2011  . Vaginal Pap smear, abnormal   . Vitamin D deficiency disease 01/27/2019  . Yeast infection     Past Surgical History:  Procedure Laterality Date  . APPENDECTOMY  08/14/2017  . COLPOSCOPY    . LAPAROSCOPIC APPENDECTOMY N/A 08/14/2017   Procedure: APPENDECTOMY LAPAROSCOPIC;  Surgeon: Erroll Luna, MD;  Location: [place];  Service: General;  Laterality: N/A;  . LEEP    . TUBAL LIGATION N/A 01/22/2018   Procedure: POST PARTUM TUBAL LIGATION;  Surgeon: Gwynne Edinger, MD;  Location: [place];  Service: Gynecology;  Laterality: N/A;  . WISDOM TOOTH EXTRACTION      Family Psychiatric History: Please see initial evaluation for full details. I have reviewed the history. No updates at this time.     Family History:  Family History  Problem Relation Age of Onset  . Alcohol abuse Father   . Emphysema Father   . COPD Father   . Mental illness Father        PTSD  . Post-traumatic stress disorder Father   . Alcohol abuse Brother   . Stroke Brother   . Cancer Paternal Grandmother        skin cancer  . Multiple sclerosis Sister   . Depression Mother   . Bipolar disorder Mother   . Anxiety disorder Mother   . Asthma Son   . Heart disease Maternal Grandfather     Social History:  Social History   Socioeconomic History  . Marital status: Significant Other    Spouse name: todd  . Number of children: 6  . Years of education: 84  . Highest education level: Not on file  Occupational History  . Occupation: unemployed    Comment: caregiver  Tobacco Use  . Smoking status: Former Smoker    Types: Cigarettes  . Smokeless tobacco: Never Used  . Tobacco comment: quit smoking in2007  Vaping Use  . Vaping Use: Never used  Substance and Sexual Activity  . Alcohol use: No    Comment: occ before pregnancy  . Drug use: No  . Sexual activity: Yes    Birth control/protection: Surgical    Comment: tubal  Other Topics Concern  . Not on file  Social History Narrative   Divorced.Lives in home with 6 children and boyfriend of 4 years.   Unemployed.   Social Determinants of Health   Financial Resource Strain:   . Difficulty of Paying Living Expenses:   Food Insecurity:   . Worried About Charity fundraiser in the Last Year:   . Arboriculturist in the Last Year:   Transportation Needs:   . Film/video editor (Medical):   Marland Kitchen Lack of Transportation (Non-Medical):   Physical Activity:   . Days of Exercise per Week:   . Minutes of Exercise per Session:   Stress:    . Feeling of Stress :   Social Connections:   . Frequency of Communication with Friends and Family:   . Frequency of Social Gatherings with Friends and Family:   . Attends Religious Services:   . Active Member of Clubs or Organizations:   . Attends Archivist Meetings:   Marland Kitchen Marital Status:     Allergies: No Known Allergies  Metabolic Disorder Labs: No results found for: HGBA1C, MPG No results found for: PROLACTIN Lab Results  Component Value Date   CHOL  148 01/29/2017   TRIG 115 01/29/2017   HDL 45 (L) 01/29/2017   CHOLHDL 3.3 01/29/2017   LDLCALC 82 01/29/2017   Lab Results  Component Value Date   TSH 2.13 01/27/2019   TSH 2.21 01/29/2017    Therapeutic Level Labs: No results found for: LITHIUM No results found for: VALPROATE No components found for:  CBMZ  Current Medications: Current Outpatient Medications  Medication Sig Dispense Refill  . buPROPion (WELLBUTRIN XL) 300 MG 24 hr tablet Take 1 tablet (300 mg total) by mouth every morning. 90 tablet 0  . sertraline (ZOLOFT) 100 MG tablet Take 2 tablets (200 mg total) by mouth daily. 180 tablet 0  . [START ON 11/17/2019] traZODone (DESYREL) 150 MG tablet Take 1 tablet (150 mg total) by mouth at bedtime. 90 tablet 0  . ziprasidone (GEODON) 20 MG capsule 20 mg in AM, 40 mg in PM. Take along with 40 mg tab 60 capsule 1  . ziprasidone (GEODON) 40 MG capsule 20 mg in AM, 40 mg in PM. Take along with 20 mg tab 30 capsule 1  . zolpidem (AMBIEN) 5 MG tablet Take 1 tablet (5 mg total) by mouth at bedtime as needed for sleep. 30 tablet 1   No current facility-administered medications for this visit.     Musculoskeletal: Strength & Muscle Tone: N/A Gait & Station: N/A Patient leans: N/A  Psychiatric Specialty Exam: Review of Systems  Psychiatric/Behavioral: Positive for decreased concentration, hallucinations and sleep disturbance. Negative for agitation, behavioral problems, confusion, dysphoric mood,  self-injury and suicidal ideas. The patient is nervous/anxious. The patient is not hyperactive.   All other systems reviewed and are negative.   currently breastfeeding.There is no height or weight on file to calculate BMI.  General Appearance: Fairly Groomed  Eye Contact:  Good  Speech:  Clear and Coherent  Volume:  Normal  Mood:  jittery  Affect:  Appropriate, Congruent and slightly down  Thought Process:  Coherent  Orientation:  Full (Time, Place, and Person)  Thought Content: Logical   Suicidal Thoughts:  No  Homicidal Thoughts:  No  Memory:  Immediate;   Good  Judgement:  Good  Insight:  Fair  Psychomotor Activity:  Normal  Concentration:  Concentration: Good and Attention Span: Good  Recall:  Good  Fund of Knowledge: Good  Language: Good  Akathisia:  No  Handed:  Right  AIMS (if indicated): not done  Assets:  Communication Skills Desire for Improvement  ADL's:  Intact  Cognition: WNL  Sleep:  Poor   Screenings: PHQ2-9     Office Visit from 11/26/2018 in [place] from [facility] Initial Prenatal from 07/02/2017 in [place] Office Visit from 05/29/2017 in [place] Office Visit from 01/29/2017 in [facility]  PHQ-2 Total Score 2 0 0 0 0  PHQ-9 Total Score 7 -- 2 -- --       Assessment and Plan:  Michelle Page is a 44 y.o. year old female with a history of depression , who presents for follow up appointment for below.   1. 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 Will uptitrate Geodon to target these symptoms as well as adjunctive treatment for depression. She is advised gain to monitor weigh. Will continue sertraline and bupropion to target depression.  2. Insomnia, unspecified type She reports initial  and middle insomnia. She has had limited benefit from Ambien/trazodone at times, although it worked very well when she was first started. Will continue current dose of trazodone and Ambien for insomnia at this time to avoid develop tolerance. She agrees with plans.   Plan 1.Continuesertraline200 mg daily 2.Continuebupropion 300 mg daily  3. IncreaseGeodon20 mg in AM, 40 mg in PM - monitor weight gain 5. Continue Ambien 5 mg at night 15 tabs per month due to insurance restriction 6.ContinueTrazodone150 mgat night as needed for sleep 7. Next appointment:9/10 at 10 AM for 30 mins, video -TSH wnl checked by history  Past trials of medication:citalopram, bupropion, venlafaxine, carbamazepine, quetiapine (drowsiness), risperidone, Abilify(weight gain)  The patient demonstrates the following risk factors for suicide: Chronic risk factors for suicide include:psychiatric disorder ofdepression. Acute risk factorsfor suicide include: unemployment. Protective factorsfor this patient include: positive social support, responsibility to others (children, family), coping skills and hope for the future. Considering these factors, the overall suicide risk at this point appears to below. Patientisappropriate for outpatient follow up.  Norman Clay, MD 11/13/2019, 11:31 AM

## 2019-11-13 ENCOUNTER — Telehealth (INDEPENDENT_AMBULATORY_CARE_PROVIDER_SITE_OTHER): Payer: Medicaid Other | Admitting: Psychiatry

## 2019-11-13 ENCOUNTER — Encounter (HOSPITAL_COMMUNITY): Payer: Self-pay | Admitting: Psychiatry

## 2019-11-13 ENCOUNTER — Other Ambulatory Visit: Payer: Self-pay

## 2019-11-13 DIAGNOSIS — G4700 Insomnia, unspecified: Secondary | ICD-10-CM

## 2019-11-13 DIAGNOSIS — F330 Major depressive disorder, recurrent, mild: Secondary | ICD-10-CM

## 2019-11-13 MED ORDER — TRAZODONE HCL 150 MG PO TABS: 150.0000 mg | ORAL_TABLET | Freq: Every day | ORAL | 0 refills | Status: DC

## 2019-11-13 MED ORDER — ZOLPIDEM TARTRATE 5 MG PO TABS: 5.0000 mg | ORAL_TABLET | Freq: Every evening | ORAL | 1 refills | Status: DC | PRN

## 2019-11-13 MED ORDER — ZIPRASIDONE HCL 20 MG PO CAPS: ORAL_CAPSULE | ORAL | 1 refills | Status: DC

## 2019-11-13 MED ORDER — ZIPRASIDONE HCL 40 MG PO CAPS: ORAL_CAPSULE | ORAL | 1 refills | Status: DC

## 2019-11-13 NOTE — Patient Instructions (Signed)
1.Continuesertraline200 mg daily 2.Continuebupropion 300 mg daily  3. IncreaseGeodon20 mg in AM, 40 mg in PM - monitor weight gain 5. Continue Ambien 5 mg at night 15 tabs per month due to insurance restriction 6.ContinueTrazodone150 mgat night as needed for sleep 7. Next appointment:9/10 at 63 AM

## 2019-11-18 ENCOUNTER — Other Ambulatory Visit: Payer: Self-pay

## 2019-11-18 ENCOUNTER — Ambulatory Visit (HOSPITAL_COMMUNITY): Payer: Medicaid Other | Admitting: Clinical

## 2019-11-18 ENCOUNTER — Telehealth (HOSPITAL_COMMUNITY): Payer: Self-pay | Admitting: Clinical

## 2019-11-18 NOTE — Telephone Encounter (Signed)
The OPT therapist attempted email to session x2 @ 10:00AM and 10:10AM , however, the patient did not respond missing their scheduled session.

## 2020-01-05 NOTE — Progress Notes (Deleted)
[facility] MD/PA/NP OP Progress Note  01/05/2020 9:12 AM Michelle Page  MRN:  [id number]  Chief Complaint:  HPI: *** Visit Diagnosis: No diagnosis found.  Past Psychiatric History: Please see initial evaluation for full details. I have reviewed the history. No updates at this time.     Past Medical History:  Past Medical History:  Diagnosis Date  . Abnormal Pap smear    colpo/leep  . Anemia   . Anxiety   . Cancer [facility]) 2006   Skin  . Chlamydia infection   . Depression   . Fractures   . Gestational diabetes    metformin  . H/O candidiasis   . H/O varicella   . Headache(784.0)    migraines as a child   . Heart murmur   . High risk HPV infection   . Obesity (BMI 30-39.9) 01/27/2019  . Papanicolaou smear of cervix with positive high risk human papilloma virus (HPV) test 06/03/2017  . Pregnancy induced hypertension    1st & 2nd pregnancy  . Pregnancy induced hypertension    1st & 2nd pregnancy   . Two vessel umbilical cord, antepartum 11/26/2011  . Vaginal Pap smear, abnormal   . Vitamin D deficiency disease 01/27/2019  . Yeast infection     Past Surgical History:  Procedure Laterality Date  . APPENDECTOMY  08/14/2017  . COLPOSCOPY    . LAPAROSCOPIC APPENDECTOMY N/A 08/14/2017   Procedure: APPENDECTOMY LAPAROSCOPIC;  Surgeon: Erroll Luna, MD;  Location: [place];  Service: General;  Laterality: N/A;  . LEEP    . TUBAL LIGATION N/A 01/22/2018   Procedure: POST PARTUM TUBAL LIGATION;  Surgeon: Gwynne Edinger, MD;  Location: [place];  Service: Gynecology;  Laterality: N/A;  . WISDOM TOOTH EXTRACTION      Family Psychiatric History: Please see initial evaluation for full details. I have reviewed the history. No updates at this time.     Family History:  Family History  Problem Relation Age of Onset  . Alcohol abuse Father   . Emphysema Father   . COPD Father   . Mental illness Father        PTSD  . Post-traumatic stress disorder Father   . Alcohol abuse  Brother   . Stroke Brother   . Cancer Paternal Grandmother        skin cancer  . Multiple sclerosis Sister   . Depression Mother   . Bipolar disorder Mother   . Anxiety disorder Mother   . Asthma Son   . Heart disease Maternal Grandfather     Social History:  Social History   Socioeconomic History  . Marital status: Significant Other    Spouse name: todd  . Number of children: 6  . Years of education: 1  . Highest education level: Not on file  Occupational History  . Occupation: unemployed    Comment: caregiver  Tobacco Use  . Smoking status: Former Smoker    Types: Cigarettes  . Smokeless tobacco: Never Used  . Tobacco comment: quit smoking in2007  Vaping Use  . Vaping Use: Never used  Substance and Sexual Activity  . Alcohol use: No    Comment: occ before pregnancy  . Drug use: No  . Sexual activity: Yes    Birth control/protection: Surgical    Comment: tubal  Other Topics Concern  . Not on file  Social History Narrative   Divorced.Lives in home with 6 children and boyfriend of 4 years.   Unemployed.   Social  Determinants of Health   Financial Resource Strain:   . Difficulty of Paying Living Expenses: Not on file  Food Insecurity:   . Worried About Charity fundraiser in the Last Year: Not on file  . Ran Out of Food in the Last Year: Not on file  Transportation Needs:   . Lack of Transportation (Medical): Not on file  . Lack of Transportation (Non-Medical): Not on file  Physical Activity:   . Days of Exercise per Week: Not on file  . Minutes of Exercise per Session: Not on file  Stress:   . Feeling of Stress : Not on file  Social Connections:   . Frequency of Communication with Friends and Family: Not on file  . Frequency of Social Gatherings with Friends and Family: Not on file  . Attends Religious Services: Not on file  . Active Member of Clubs or Organizations: Not on file  . Attends Archivist Meetings: Not on file  . Marital Status:  Not on file    Allergies: No Known Allergies  Metabolic Disorder Labs: No results found for: HGBA1C, MPG No results found for: PROLACTIN Lab Results  Component Value Date   CHOL 148 01/29/2017   TRIG 115 01/29/2017   HDL 45 (L) 01/29/2017   CHOLHDL 3.3 01/29/2017   LDLCALC 82 01/29/2017   Lab Results  Component Value Date   TSH 2.13 01/27/2019   TSH 2.21 01/29/2017    Therapeutic Level Labs: No results found for: LITHIUM No results found for: VALPROATE No components found for:  CBMZ  Current Medications: Current Outpatient Medications  Medication Sig Dispense Refill  . buPROPion (WELLBUTRIN XL) 300 MG 24 hr tablet Take 1 tablet (300 mg total) by mouth every morning. 90 tablet 0  . sertraline (ZOLOFT) 100 MG tablet Take 2 tablets (200 mg total) by mouth daily. 180 tablet 0  . traZODone (DESYREL) 150 MG tablet Take 1 tablet (150 mg total) by mouth at bedtime. 90 tablet 0  . ziprasidone (GEODON) 20 MG capsule 20 mg in AM, 40 mg in PM. Take along with 40 mg tab 60 capsule 1  . ziprasidone (GEODON) 40 MG capsule 20 mg in AM, 40 mg in PM. Take along with 20 mg tab 30 capsule 1  . zolpidem (AMBIEN) 5 MG tablet Take 1 tablet (5 mg total) by mouth at bedtime as needed for sleep. 30 tablet 1   No current facility-administered medications for this visit.     Musculoskeletal: Strength & Muscle Tone: N/A Gait & Station: N/A Patient leans: N/A  Psychiatric Specialty Exam: Review of Systems  currently breastfeeding.There is no height or weight on file to calculate BMI.  General Appearance: N/A  Eye Contact:  Good  Speech:  Clear and Coherent  Volume:  Normal  Mood:  {BHH MOOD:22306}  Affect:  {Affect (PAA):22687}  Thought Process:  Coherent  Orientation:  Full (Time, Place, and Person)  Thought Content: Logical   Suicidal Thoughts:  {ST/HT (PAA):22692}  Homicidal Thoughts:  {ST/HT (PAA):22692}  Memory:  Immediate;   Good  Judgement:  {Judgement (PAA):22694}  Insight:   {Insight (PAA):22695}  Psychomotor Activity:  Normal  Concentration:  {Concentration:21399}  Recall:  {BHH GOOD/FAIR/POOR:22877}  Fund of Knowledge: {BHH GOOD/FAIR/POOR:22877}  Language: {BHH GOOD/FAIR/POOR:22877}  Akathisia:  {BHH YES OR NO:22294}  Handed:  {Handed:22697}  AIMS (if indicated): {Desc; done/not:10129}  Assets:  {Assets (PAA):22698}  ADL's:  {BHH GHW'E:99371}  Cognition: {chl bhh cognition:304700322}  Sleep:  {BHH GOOD/FAIR/POOR:22877}  Screenings: PHQ2-9     Office Visit from 11/26/2018 in [place] [facility] Initial Prenatal from 07/02/2017 in [place] Visit from 05/29/2017 in [place] Visit from 01/29/2017 in [facility]  PHQ-2 Total Score 2 0 0 0 0  PHQ-9 Total Score 7 -- 2 -- --       Assessment and Plan:  Michelle Page is a 44 y.o. year old female with a history ofdepression , who presents for follow up appointment for below.    1. 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 Will uptitrate Geodon to target these symptoms as well as adjunctive treatment for depression. She is advised gain to monitor weigh. Will continue sertraline and bupropion to target depression.   2. Insomnia, unspecified type She reports initial and middle insomnia. She has had limited benefit from Ambien/trazodone at times, although it worked very well when she was first started. Will continue current dose of trazodone and Ambien for insomnia at this time to avoid develop tolerance. She agrees with plans.   Plan 1.Continuesertraline200 mg daily 2.Continuebupropion 300 mg daily 3. IncreaseGeodon20 mg in AM, 40 mg in PM- monitor weight gain 5.ContinueAmbien 5 mg at night15 tabs per month due to  insurance restriction 6.ContinueTrazodone150 mgat night as needed for sleep 7. Next appointment:9/10 at 63 AMfor30 mins, video -TSH wnl checked by history  Past trials of medication:citalopram, bupropion, venlafaxine, carbamazepine, quetiapine (drowsiness), risperidone, Abilify(weight gain)  The patient demonstrates the following risk factors for suicide: Chronic risk factors for suicide include:psychiatric disorder ofdepression. Acute risk factorsfor suicide include: unemployment. Protective factorsfor this patient include: positive social support, responsibility to others (children, family), coping skills and hope for the future. Considering these factors, the overall suicide risk at this point appears to below. Patientisappropriate for outpatient follow up.  Norman Clay, MD 01/05/2020, 9:12 AM

## 2020-01-08 ENCOUNTER — Telehealth (HOSPITAL_COMMUNITY): Payer: Medicaid Other | Admitting: Psychiatry

## 2020-01-08 ENCOUNTER — Telehealth (HOSPITAL_COMMUNITY): Payer: Self-pay | Admitting: Psychiatry

## 2020-01-08 ENCOUNTER — Other Ambulatory Visit: Payer: Self-pay

## 2020-01-08 NOTE — Telephone Encounter (Signed)
Sent link for video visit through Epic. Patient did not sign in. Called the patient  for appointment scheduled today. The patient did not answer the phone. Left voice message to contact the office.  

## 2020-01-15 ENCOUNTER — Other Ambulatory Visit: Payer: Self-pay

## 2020-01-15 ENCOUNTER — Telehealth (HOSPITAL_COMMUNITY): Payer: Self-pay | Admitting: Clinical

## 2020-01-15 ENCOUNTER — Ambulatory Visit (HOSPITAL_COMMUNITY): Payer: Medicaid Other | Admitting: Clinical

## 2020-01-15 NOTE — Telephone Encounter (Signed)
The OPT therapist sent text to session at 10:00AM. The patient didnt respond OPT staff followed up with call and was unable to reach patient or leave message due to VM box being full

## 2020-02-03 NOTE — Progress Notes (Signed)
Virtual Visit via Video Note  I connected with Michelle Page on 02/08/20 at  8:30 AM EDT by a video enabled telemedicine application and verified that I am speaking with the correct person using two identifiers.   I discussed the limitations of evaluation and management by telemedicine and the availability of in person appointments. The patient expressed understanding and agreed to proceed.   I discussed the assessment and treatment plan with the patient. The patient was provided an opportunity to ask questions and all were answered. The patient agreed with the plan and demonstrated an understanding of the instructions.   The patient was advised to call back or seek an in-person evaluation if the symptoms worsen or if the condition fails to improve as anticipated.  Location: patient- home, provider- office   I provided 25 minutes of non-face-to-face time during this encounter.   Norman Clay, MD    [name] MD/PA/NP OP Progress Note  02/08/2020 9:07 AM Michelle Page  MRN:  [id number]  Chief Complaint:  Chief Complaint    Follow-up; Depression     HPI:  This is a follow-up appointment for depression, hallucinations and paranoia.  She states that she is having "blackout" almost every day. She loses consciousness at times. She was not told by other people that she has any seizure during these episodes. She states that it occurred when she felt somebody was raping her. She also felt walking around some place while she was sitting in a couch. She states that she has these experience at least for several months.  When she is asked about her father, she states that although she misses him, she knows that he is in a good place, and denies significant disturbance from it.  She continues to have AH of other people's voice, including the lady's voice, food she feels cheating. She denies CAH. She denies VH. She feels more depressed lately. Although she signed up for pre-K events for her  daughter, she does not like them and she feels fatigue.  She has fair sleep.  She has difficulty in concentration.  She reports memory loss; she occasionally forgets gets to take medication, or take twice as she does not recall if she took them.  She has decreased appetite.  She denies SI.  She feels anxious at times.  She has not noticed any side effect from Geodon.   Employment: Used to be on disability for bipolar disorder at age 29 through late 20's (it was cancelled as her husband at that time had income) Support: her boyfriend Household: children. Boyfriend of five years,   Marital status: Divorced. her ex-husband suddenly left after 13 years of marriage. Number of children: 38 (six months old to a teenager, four of them are from prior marriage).  She grew up in [place]. She was raised by her mother, who used to abuse alcohol. Her mother got "mean" after she maintained sobriety- the patient was diagnosed with bipolar disorder since then. Father deceased in November 27, 2018  Visit Diagnosis:    ICD-10-CM   1. Hallucinations  R44.3 Ambulatory referral to Neurology  2. Mild episode of recurrent major depressive disorder (HCC)  F33.0   3. Insomnia, unspecified type  G47.00     Past Psychiatric History: Please see initial evaluation for full details. I have reviewed the history. No updates at this time.     Past Medical History:  Past Medical History:  Diagnosis Date  . Abnormal Pap smear    colpo/leep  .  Anemia   . Anxiety   . Cancer [facility]) 2006   Skin  . Chlamydia infection   . Depression   . Fractures   . Gestational diabetes    metformin  . H/O candidiasis   . H/O varicella   . Headache(784.0)    migraines as a child   . Heart murmur   . High risk HPV infection   . Obesity (BMI 30-39.9) 01/27/2019  . Papanicolaou smear of cervix with positive high risk human papilloma virus (HPV) test 06/03/2017  . Pregnancy induced hypertension    1st & 2nd pregnancy  . Pregnancy induced  hypertension    1st & 2nd pregnancy   . Two vessel umbilical cord, antepartum 11/26/2011  . Vaginal Pap smear, abnormal   . Vitamin D deficiency disease 01/27/2019  . Yeast infection     Past Surgical History:  Procedure Laterality Date  . APPENDECTOMY  08/14/2017  . COLPOSCOPY    . LAPAROSCOPIC APPENDECTOMY N/A 08/14/2017   Procedure: APPENDECTOMY LAPAROSCOPIC;  Surgeon: Erroll Luna, MD;  Location: [place];  Service: General;  Laterality: N/A;  . LEEP    . TUBAL LIGATION N/A 01/22/2018   Procedure: POST PARTUM TUBAL LIGATION;  Surgeon: Gwynne Edinger, MD;  Location: [place];  Service: Gynecology;  Laterality: N/A;  . WISDOM TOOTH EXTRACTION      Family Psychiatric History: Please see initial evaluation for full details. I have reviewed the history. No updates at this time.     Family History:  Family History  Problem Relation Age of Onset  . Alcohol abuse Father   . Emphysema Father   . COPD Father   . Mental illness Father        PTSD  . Post-traumatic stress disorder Father   . Alcohol abuse Brother   . Stroke Brother   . Cancer Paternal Grandmother        skin cancer  . Multiple sclerosis Sister   . Depression Mother   . Bipolar disorder Mother   . Anxiety disorder Mother   . Asthma Son   . Heart disease Maternal Grandfather     Social History:  Social History   Socioeconomic History  . Marital status: Significant Other    Spouse name: todd  . Number of children: 6  . Years of education: 40  . Highest education level: Not on file  Occupational History  . Occupation: unemployed    Comment: caregiver  Tobacco Use  . Smoking status: Former Smoker    Types: Cigarettes  . Smokeless tobacco: Never Used  . Tobacco comment: quit smoking in2007  Vaping Use  . Vaping Use: Never used  Substance and Sexual Activity  . Alcohol use: No    Comment: occ before pregnancy  . Drug use: No  . Sexual activity: Yes    Birth control/protection: Surgical     Comment: tubal  Other Topics Concern  . Not on file  Social History Narrative   Divorced.Lives in home with 6 children and boyfriend of 4 years.   Unemployed.   Social Determinants of Health   Financial Resource Strain:   . Difficulty of Paying Living Expenses: Not on file  Food Insecurity:   . Worried About Charity fundraiser in the Last Year: Not on file  . Ran Out of Food in the Last Year: Not on file  Transportation Needs:   . Lack of Transportation (Medical): Not on file  . Lack of Transportation (Non-Medical): Not on  file  Physical Activity:   . Days of Exercise per Week: Not on file  . Minutes of Exercise per Session: Not on file  Stress:   . Feeling of Stress : Not on file  Social Connections:   . Frequency of Communication with Friends and Family: Not on file  . Frequency of Social Gatherings with Friends and Family: Not on file  . Attends Religious Services: Not on file  . Active Member of Clubs or Organizations: Not on file  . Attends Archivist Meetings: Not on file  . Marital Status: Not on file    Allergies: No Known Allergies  Metabolic Disorder Labs: No results found for: HGBA1C, MPG No results found for: PROLACTIN Lab Results  Component Value Date   CHOL 148 01/29/2017   TRIG 115 01/29/2017   HDL 45 (L) 01/29/2017   CHOLHDL 3.3 01/29/2017   LDLCALC 82 01/29/2017   Lab Results  Component Value Date   TSH 2.13 01/27/2019   TSH 2.21 01/29/2017    Therapeutic Level Labs: No results found for: LITHIUM No results found for: VALPROATE No components found for:  CBMZ  Current Medications: Current Outpatient Medications  Medication Sig Dispense Refill  . buPROPion (WELLBUTRIN XL) 300 MG 24 hr tablet Take 1 tablet (300 mg total) by mouth every morning. 90 tablet 0  . sertraline (ZOLOFT) 100 MG tablet Take 2 tablets (200 mg total) by mouth daily. 180 tablet 0  . traZODone (DESYREL) 150 MG tablet Take 1 tablet (150 mg total) by mouth at  bedtime. 90 tablet 0  . ziprasidone (GEODON) 20 MG capsule 20 mg in AM, 40 mg in PM. Take along with 40 mg tab 60 capsule 1  . ziprasidone (GEODON) 40 MG capsule 20 mg in AM, 40 mg in PM. Take along with 20 mg tab 30 capsule 1  . zolpidem (AMBIEN) 5 MG tablet Take 1 tablet (5 mg total) by mouth at bedtime as needed for sleep. 30 tablet 1   No current facility-administered medications for this visit.     Musculoskeletal: Strength & Muscle Tone: N/A Gait & Station: N/A Patient leans: N/A  Psychiatric Specialty Exam: Review of Systems  currently breastfeeding.There is no height or weight on file to calculate BMI.  General Appearance: Fairly Groomed  Eye Contact:  Good  Speech:  Clear and Coherent  Volume:  Normal  Mood:  Depressed  Affect:  Appropriate, Congruent and down  Thought Process:  Coherent  Orientation:  Full (Time, Place, and Person)  Thought Content: Logical   Suicidal Thoughts:  No  Homicidal Thoughts:  No  Memory:  Immediate;   Good  Judgement:  Good  Insight:  Fair  Psychomotor Activity:  Normal  Concentration:  Concentration: Good and Attention Span: Good  Recall:  Good  Fund of Knowledge: Good  Language: Good  Akathisia:  No  Handed:  Right  AIMS (if indicated): not done  Assets:  Communication Skills Desire for Improvement  ADL's:  Intact  Cognition: WNL  Sleep:  Fair   Screenings: PHQ2-9     Office Visit from 11/26/2018 in [place] from [facility] Initial Prenatal from 07/02/2017 in [place] Office Visit from 05/29/2017 in [place] Office Visit from 01/29/2017 in [facility]  PHQ-2 Total Score 2 0 0 0 0  PHQ-9 Total Score 7 -- 2 -- --       Assessment and Plan:  Rindy  MAKENIZE MESSMAN is a 44 y.o. year old female with a history of depression, who presents for follow up appointment for below.   1. Mild episode of recurrent major depressive disorder  ([place]) 2. Hallucinations She reports slight worsening in depressive symptoms, and continues to have ego dystonic paranoia and hallucinations in the context of low adherence to medication.  Discussed the way she can improve adherence.  Will continue current medication regimen to first see if these are effective for her symptoms.  Will continue sertraline and bupropion to target depression.  She has no known history of seizure.  Will continue Geodon, which was uptitrated at the last visit to target hallucinations and paranoia.  Discussed potential risk of EPS and weight gain.    # Loss of consciousness She reports episodes of loss of consciousness, which has been chronic.  Although it can be attributable to her mood symptoms, will make referral to neurology to rule out other etiology.   3 Insomnia There has been slight improvement in insomnia.  Will continue trazodone and Ambien for insomnia.   Plan - She will contact the clinic before she runs out of her medication  1.Continuesertraline200 mg daily 2.Continuebupropion 300 mg daily 3. ContinueGeodon20 mg in AM, 40 mg in PM- monitor weight gain 5.ContinueAmbien 5 mg at night15 tabs per month due to insurance restriction 6.ContinueTrazodone150 mgat night as needed for sleep 7. Next appointment: 11/15 at 9 AM for 30 mins -TSH wnl checked by history  I have utilized the [place] Controlled Substances Reporting System (PMP AWARxE) to confirm adherence regarding the patient's medication. My review reveals appropriate prescription fills.   Past trials of medication:citalopram, bupropion, venlafaxine, carbamazepine, quetiapine (drowsiness), risperidone, Abilify(weight gain)  The patient demonstrates the following risk factors for suicide: Chronic risk factors for suicide include:psychiatric disorder ofdepression. Acute risk factorsfor suicide include: unemployment. Protective factorsfor this patient include: positive social  support, responsibility to others (children, family), coping skills and hope for the future. Considering these factors, the overall suicide risk at this point appears to below. Patientisappropriate for outpatient follow up.  Norman Clay, MD 02/08/2020, 9:07 AM

## 2020-02-05 ENCOUNTER — Telehealth (HOSPITAL_COMMUNITY): Payer: Medicaid Other | Admitting: Psychiatry

## 2020-02-08 ENCOUNTER — Telehealth (INDEPENDENT_AMBULATORY_CARE_PROVIDER_SITE_OTHER): Payer: Medicaid Other | Admitting: Psychiatry

## 2020-02-08 ENCOUNTER — Other Ambulatory Visit: Payer: Self-pay

## 2020-02-08 ENCOUNTER — Encounter (HOSPITAL_COMMUNITY): Payer: Self-pay | Admitting: Psychiatry

## 2020-02-08 DIAGNOSIS — G4700 Insomnia, unspecified: Secondary | ICD-10-CM

## 2020-02-08 DIAGNOSIS — F330 Major depressive disorder, recurrent, mild: Secondary | ICD-10-CM

## 2020-02-08 DIAGNOSIS — R443 Hallucinations, unspecified: Secondary | ICD-10-CM | POA: Diagnosis not present

## 2020-02-08 NOTE — Patient Instructions (Signed)
1.Continuesertraline200 mg daily 2.Continuebupropion 300 mg daily 3. ContinueGeodon20 mg in AM, 40 mg in PM- monitor weight gain 5.ContinueAmbien 5 mg at night15 tabs per month due to insurance restriction 6.ContinueTrazodone150 mgat night as needed for sleep 7. Next appointment: 11/15 at 9 AM

## 2020-03-01 NOTE — Progress Notes (Signed)
Virtual Visit via Video Note  I connected with Michelle Page on 03/14/20 at  9:00 AM EST by a video enabled telemedicine application and verified that I am speaking with the correct person using two identifiers.  Location: Patient: home  Provider: office   I discussed the limitations of evaluation and management by telemedicine and the availability of in person appointments. The patient expressed understanding and agreed to proceed.   I discussed the assessment and treatment plan with the patient. The patient was provided an opportunity to ask questions and all were answered. The patient agreed with the plan and demonstrated an understanding of the instructions.   The patient was advised to call back or seek an in-person evaluation if the symptoms worsen or if the condition fails to improve as anticipated.  I provided 14 minutes of non-face-to-face time during this encounter.   Norman Clay, MD    [facility] MD/PA/NP OP Progress Note  03/14/2020 9:24 AM Michelle Page  MRN:  [id number]  Chief Complaint:  Chief Complaint    Follow-up; Depression; Other     HPI:  This is a follow-up appointment for depression.  She states that everything is good.  When she is asked about her frequent yawning, she states that she just woke up before this visit.  Although she continues to miss taking medication at times, it has become much less compared to before.  When she is asked how she improved her adherence, she states that she does not know, stating that she just woke up this morning.  She has been busy.  She feels like everybody seems to be in a rush around the holiday season.  She is looking forward to have Thanksgiving dinner with her family/children and her boyfriend.  She enjoys going outside, and taking a walk at times.  She sleeps 6 hours.  She feels fatigue in the morning.  She has difficulty in concentration.  She has fair appetite.  She feels happy about her current weight.  She denies  SI.  She denies AH, VH.  She denies paranoia.  She denies any concerns about the lady she mentioned in the past.    Daily routine-  Employment: Used to be on disability for bipolar disorder at age 55 through late 20's (it was cancelled as her husband at that time had income) Support: her boyfriend Household: children. Boyfriend of five years,   Marital status: Divorced. her ex-husband suddenly left after 13 years of marriage. Number of children: 51 (six months old to a teenager, four of them are from prior marriage).  She grew up in [place]. She was raised by her mother, who used to abuse alcohol. Her mother got "mean" after she maintained sobriety- the patient was diagnosed with bipolar disorder since then. Father deceased in 11-27-2018  Visit Diagnosis:    ICD-10-CM   1. Hallucinations  R44.3   2. MDD (major depressive disorder), recurrent, in partial remission (HCC)  F33.41 sertraline (ZOLOFT) 100 MG tablet    buPROPion (WELLBUTRIN XL) 300 MG 24 hr tablet  3. Insomnia, unspecified type  G47.00     Past Psychiatric History: Please see initial evaluation for full details. I have reviewed the history. No updates at this time.     Past Medical History:  Past Medical History:  Diagnosis Date  . Abnormal Pap smear    colpo/leep  . Anemia   . Anxiety   . Cancer [facility]) 2006   Skin  . Chlamydia infection   .  Depression   . Fractures   . Gestational diabetes    metformin  . H/O candidiasis   . H/O varicella   . Headache(784.0)    migraines as a child   . Heart murmur   . High risk HPV infection   . Obesity (BMI 30-39.9) 01/27/2019  . Papanicolaou smear of cervix with positive high risk human papilloma virus (HPV) test 06/03/2017  . Pregnancy induced hypertension    1st & 2nd pregnancy  . Pregnancy induced hypertension    1st & 2nd pregnancy   . Two vessel umbilical cord, antepartum 11/26/2011  . Vaginal Pap smear, abnormal   . Vitamin D deficiency disease 01/27/2019  . Yeast  infection     Past Surgical History:  Procedure Laterality Date  . APPENDECTOMY  08/14/2017  . COLPOSCOPY    . LAPAROSCOPIC APPENDECTOMY N/A 08/14/2017   Procedure: APPENDECTOMY LAPAROSCOPIC;  Surgeon: Erroll Luna, MD;  Location: [place];  Service: General;  Laterality: N/A;  . LEEP    . TUBAL LIGATION N/A 01/22/2018   Procedure: POST PARTUM TUBAL LIGATION;  Surgeon: Gwynne Edinger, MD;  Location: [place];  Service: Gynecology;  Laterality: N/A;  . WISDOM TOOTH EXTRACTION      Family Psychiatric History: Please see initial evaluation for full details. I have reviewed the history. No updates at this time.     Family History:  Family History  Problem Relation Age of Onset  . Alcohol abuse Father   . Emphysema Father   . COPD Father   . Mental illness Father        PTSD  . Post-traumatic stress disorder Father   . Alcohol abuse Brother   . Stroke Brother   . Cancer Paternal Grandmother        skin cancer  . Multiple sclerosis Sister   . Depression Mother   . Bipolar disorder Mother   . Anxiety disorder Mother   . Asthma Son   . Heart disease Maternal Grandfather     Social History:  Social History   Socioeconomic History  . Marital status: Significant Other    Spouse name: todd  . Number of children: 6  . Years of education: 22  . Highest education level: Not on file  Occupational History  . Occupation: unemployed    Comment: caregiver  Tobacco Use  . Smoking status: Former Smoker    Types: Cigarettes  . Smokeless tobacco: Never Used  . Tobacco comment: quit smoking in2007  Vaping Use  . Vaping Use: Never used  Substance and Sexual Activity  . Alcohol use: No    Comment: occ before pregnancy  . Drug use: No  . Sexual activity: Yes    Birth control/protection: Surgical    Comment: tubal  Other Topics Concern  . Not on file  Social History Narrative   Divorced.Lives in home with 6 children and boyfriend of 4 years.   Unemployed.    Social Determinants of Health   Financial Resource Strain:   . Difficulty of Paying Living Expenses: Not on file  Food Insecurity:   . Worried About Charity fundraiser in the Last Year: Not on file  . Ran Out of Food in the Last Year: Not on file  Transportation Needs:   . Lack of Transportation (Medical): Not on file  . Lack of Transportation (Non-Medical): Not on file  Physical Activity:   . Days of Exercise per Week: Not on file  . Minutes of Exercise per  Session: Not on file  Stress:   . Feeling of Stress : Not on file  Social Connections:   . Frequency of Communication with Friends and Family: Not on file  . Frequency of Social Gatherings with Friends and Family: Not on file  . Attends Religious Services: Not on file  . Active Member of Clubs or Organizations: Not on file  . Attends Archivist Meetings: Not on file  . Marital Status: Not on file    Allergies: No Known Allergies  Metabolic Disorder Labs: No results found for: HGBA1C, MPG No results found for: PROLACTIN Lab Results  Component Value Date   CHOL 148 01/29/2017   TRIG 115 01/29/2017   HDL 45 (L) 01/29/2017   CHOLHDL 3.3 01/29/2017   LDLCALC 82 01/29/2017   Lab Results  Component Value Date   TSH 2.13 01/27/2019   TSH 2.21 01/29/2017    Therapeutic Level Labs: No results found for: LITHIUM No results found for: VALPROATE No components found for:  CBMZ  Current Medications: Current Outpatient Medications  Medication Sig Dispense Refill  . buPROPion (WELLBUTRIN XL) 300 MG 24 hr tablet Take 1 tablet (300 mg total) by mouth every morning. 90 tablet 0  . sertraline (ZOLOFT) 100 MG tablet Take 2 tablets (200 mg total) by mouth daily. 180 tablet 0  . traZODone (DESYREL) 150 MG tablet Take 1 tablet (150 mg total) by mouth at bedtime. 90 tablet 0  . ziprasidone (GEODON) 20 MG capsule 20 mg in AM, 40 mg in PM. Take along with 40 mg tab 60 capsule 1  . ziprasidone (GEODON) 40 MG capsule 20 mg  in AM, 40 mg in PM. Take along with 20 mg tab 30 capsule 1  . zolpidem (AMBIEN) 5 MG tablet Take 1 tablet (5 mg total) by mouth at bedtime as needed for sleep. 30 tablet 1   No current facility-administered medications for this visit.     Musculoskeletal: Strength & Muscle Tone: N/A Gait & Station: N/A Patient leans: N/A  Psychiatric Specialty Exam: Review of Systems  Psychiatric/Behavioral: Positive for decreased concentration. Negative for agitation, behavioral problems, confusion, dysphoric mood, hallucinations, self-injury, sleep disturbance and suicidal ideas. The patient is not nervous/anxious and is not hyperactive.   All other systems reviewed and are negative.   currently breastfeeding.There is no height or weight on file to calculate BMI.  General Appearance: Fairly Groomed  Eye Contact:  Good  Speech:  Clear and Coherent  Volume:  Normal  Mood:  good  Affect:  Appropriate, Congruent and fatigue  Thought Process:  Coherent  Orientation:  Full (Time, Place, and Person)  Thought Content: Logical   Suicidal Thoughts:  No  Homicidal Thoughts:  No  Memory:  Immediate;   Good  Judgement:  Good  Insight:  Fair  Psychomotor Activity:  Normal  Concentration:  Concentration: Good and Attention Span: Good  Recall:  Good  Fund of Knowledge: Good  Language: Good  Akathisia:  No  Handed:  Right  AIMS (if indicated): not done  Assets:  Communication Skills Desire for Improvement  ADL's:  Intact  Cognition: WNL  Sleep:  Fair   Screenings: PHQ2-9     Office Visit from 11/26/2018 in [place] [facility] Initial Prenatal from 07/02/2017 in [place] Office Visit from 05/29/2017 in [place] Office Visit from 01/29/2017 in [facility]  PHQ-2 Total Score 2 0 0 0 0  PHQ-9 Total Score 7 -- 2 -- --       Assessment and Plan:  Michelle Page is a 44 y.o. year old female with a  history of depression, who presents for follow up appointment for below.   1. MDD (major depressive disorder), recurrent, in partial remission ([place]) 2. Hallucinations Although exam is notable for fatigue, which she attributes to her just waking up before the appointment , there has been significant improvement in depressive symptoms and ego dystonic paranoia/hallucinations since improvement in medication adherence. Will continue current medication regimen at this time.  Will continue sertraline and bupropion to target depression.  We will continue Geodon as adjunctive treatment for depression/hallucinations/paranoia.  Discussed potential risk of EPS and weight gain.   3. Insomnia, unspecified type Improving.  Will continue Ambien and trazodone as needed for insomnia.   # Loss of consciousness Improving.  She has an upcoming appointment with her neurologist for episodes of loss of consciousness.    Plan I have reviewed and updated plans as below 1.Continuesertraline200 mg daily 2.Continuebupropion 300 mg daily 3.ContinueGeodon20 mgin AM, 40 mg in PM- monitor weight gain 5.ContinueAmbien 5 mg at night15 tabs per month due to insurance restriction 6.ContinueTrazodone150 mgat night as needed for sleep 7. Next appointment: 1/11 at 8:40 for 20 mins, video -TSH wnl checked by history  Past trials of medication:citalopram, bupropion, venlafaxine, carbamazepine, quetiapine (drowsiness), risperidone, Abilify(weight gain)  The patient demonstrates the following risk factors for suicide: Chronic risk factors for suicide include:psychiatric disorder ofdepression. Acute risk factorsfor suicide include: unemployment. Protective factorsfor this patient include: positive social support, responsibility to others (children, family), coping skills and hope for the future. Considering these factors, the overall suicide risk at this point appears to below. Patientisappropriate  for outpatient follow up.  Norman Clay, MD 03/14/2020, 9:24 AM

## 2020-03-14 ENCOUNTER — Encounter: Payer: Self-pay | Admitting: Psychiatry

## 2020-03-14 ENCOUNTER — Telehealth (HOSPITAL_COMMUNITY): Payer: Medicaid Other | Admitting: Psychiatry

## 2020-03-14 ENCOUNTER — Other Ambulatory Visit: Payer: Self-pay

## 2020-03-14 ENCOUNTER — Telehealth (INDEPENDENT_AMBULATORY_CARE_PROVIDER_SITE_OTHER): Payer: Medicaid Other | Admitting: Psychiatry

## 2020-03-14 DIAGNOSIS — R443 Hallucinations, unspecified: Secondary | ICD-10-CM | POA: Diagnosis not present

## 2020-03-14 DIAGNOSIS — F3341 Major depressive disorder, recurrent, in partial remission: Secondary | ICD-10-CM | POA: Diagnosis not present

## 2020-03-14 DIAGNOSIS — G4700 Insomnia, unspecified: Secondary | ICD-10-CM

## 2020-03-14 MED ORDER — BUPROPION HCL ER (XL) 300 MG PO TB24: 300.0000 mg | ORAL_TABLET | ORAL | 0 refills | Status: DC

## 2020-03-14 MED ORDER — ZOLPIDEM TARTRATE 5 MG PO TABS: 5.0000 mg | ORAL_TABLET | Freq: Every evening | ORAL | 1 refills | Status: DC | PRN

## 2020-03-14 MED ORDER — ZIPRASIDONE HCL 20 MG PO CAPS
ORAL_CAPSULE | ORAL | 1 refills | Status: DC
Start: 2020-03-14 — End: 2020-06-09

## 2020-03-14 MED ORDER — ZIPRASIDONE HCL 40 MG PO CAPS
ORAL_CAPSULE | ORAL | 1 refills | Status: DC
Start: 2020-03-14 — End: 2020-05-11

## 2020-03-14 MED ORDER — TRAZODONE HCL 150 MG PO TABS: 150.0000 mg | ORAL_TABLET | Freq: Every day | ORAL | 0 refills | Status: DC

## 2020-03-14 MED ORDER — SERTRALINE HCL 100 MG PO TABS: 200.0000 mg | ORAL_TABLET | Freq: Every day | ORAL | 0 refills | Status: DC

## 2020-03-14 NOTE — Patient Instructions (Signed)
1.Continuesertraline200 mg daily 2.Continuebupropion 300 mg daily 3.ContinueGeodon20 mgin AM, 40 mg in PM- monitor weight gain 5.ContinueAmbien 5 mg at night15 tabs per month due to insurance restriction 6.ContinueTrazodone150 mgat night as needed for sleep 7. Next appointment: 1/11 at 8:40

## 2020-05-03 NOTE — Progress Notes (Deleted)
[place] MD/PA/NP OP Progress Note  05/03/2020 11:31 AM Michelle Page  MRN:  [id number]  Chief Complaint:  HPI: *** Visit Diagnosis: No diagnosis found.  Past Psychiatric History: Please see initial evaluation for full details. I have reviewed the history. No updates at this time.     Past Medical History:  Past Medical History:  Diagnosis Date  . Abnormal Pap smear    colpo/leep  . Anemia   . Anxiety   . Cancer [facility]) 2006   Skin  . Chlamydia infection   . Depression   . Fractures   . Gestational diabetes    metformin  . H/O candidiasis   . H/O varicella   . Headache(784.0)    migraines as a child   . Heart murmur   . High risk HPV infection   . Obesity (BMI 30-39.9) 01/27/2019  . Papanicolaou smear of cervix with positive high risk human papilloma virus (HPV) test 06/03/2017  . Pregnancy induced hypertension    1st & 2nd pregnancy  . Pregnancy induced hypertension    1st & 2nd pregnancy   . Two vessel umbilical cord, antepartum 11/26/2011  . Vaginal Pap smear, abnormal   . Vitamin D deficiency disease 01/27/2019  . Yeast infection     Past Surgical History:  Procedure Laterality Date  . APPENDECTOMY  08/14/2017  . COLPOSCOPY    . LAPAROSCOPIC APPENDECTOMY N/A 08/14/2017   Procedure: APPENDECTOMY LAPAROSCOPIC;  Surgeon: Erroll Luna, MD;  Location: [place];  Service: General;  Laterality: N/A;  . LEEP    . TUBAL LIGATION N/A 01/22/2018   Procedure: POST PARTUM TUBAL LIGATION;  Surgeon: Gwynne Edinger, MD;  Location: [place];  Service: Gynecology;  Laterality: N/A;  . WISDOM TOOTH EXTRACTION      Family Psychiatric History: Please see initial evaluation for full details. I have reviewed the history. No updates at this time.     Family History:  Family History  Problem Relation Age of Onset  . Alcohol abuse Father   . Emphysema Father   . COPD Father   . Mental illness Father        PTSD  . Post-traumatic stress disorder Father   . Alcohol  abuse Brother   . Stroke Brother   . Cancer Paternal Grandmother        skin cancer  . Multiple sclerosis Sister   . Depression Mother   . Bipolar disorder Mother   . Anxiety disorder Mother   . Asthma Son   . Heart disease Maternal Grandfather     Social History:  Social History   Socioeconomic History  . Marital status: Significant Other    Spouse name: todd  . Number of children: 6  . Years of education: 2  . Highest education level: Not on file  Occupational History  . Occupation: unemployed    Comment: caregiver  Tobacco Use  . Smoking status: Former Smoker    Types: Cigarettes  . Smokeless tobacco: Never Used  . Tobacco comment: quit smoking in2007  Vaping Use  . Vaping Use: Never used  Substance and Sexual Activity  . Alcohol use: No    Comment: occ before pregnancy  . Drug use: No  . Sexual activity: Yes    Birth control/protection: Surgical    Comment: tubal  Other Topics Concern  . Not on file  Social History Narrative   Divorced.Lives in home with 6 children and boyfriend of 4 years.   Unemployed.   Social  Determinants of Health   Financial Resource Strain: Not on file  Food Insecurity: Not on file  Transportation Needs: Not on file  Physical Activity: Not on file  Stress: Not on file  Social Connections: Not on file    Allergies: No Known Allergies  Metabolic Disorder Labs: No results found for: HGBA1C, MPG No results found for: PROLACTIN Lab Results  Component Value Date   CHOL 148 01/29/2017   TRIG 115 01/29/2017   HDL 45 (L) 01/29/2017   CHOLHDL 3.3 01/29/2017   LDLCALC 82 01/29/2017   Lab Results  Component Value Date   TSH 2.13 01/27/2019   TSH 2.21 01/29/2017    Therapeutic Level Labs: No results found for: LITHIUM No results found for: VALPROATE No components found for:  CBMZ  Current Medications: Current Outpatient Medications  Medication Sig Dispense Refill  . buPROPion (WELLBUTRIN XL) 300 MG 24 hr tablet Take 1  tablet (300 mg total) by mouth every morning. 90 tablet 0  . sertraline (ZOLOFT) 100 MG tablet Take 2 tablets (200 mg total) by mouth daily. 180 tablet 0  . traZODone (DESYREL) 150 MG tablet Take 1 tablet (150 mg total) by mouth at bedtime. 90 tablet 0  . ziprasidone (GEODON) 20 MG capsule 20 mg in AM, 40 mg in PM. Take along with 40 mg tab 60 capsule 1  . ziprasidone (GEODON) 40 MG capsule 20 mg in AM, 40 mg in PM. Take along with 20 mg tab 30 capsule 1  . zolpidem (AMBIEN) 5 MG tablet Take 1 tablet (5 mg total) by mouth at bedtime as needed for sleep. 30 tablet 1   No current facility-administered medications for this visit.     Musculoskeletal: Strength & Muscle Tone: N/A Gait & Station: N/A Patient leans: N/A  Psychiatric Specialty Exam: Review of Systems  currently breastfeeding.There is no height or weight on file to calculate BMI.  General Appearance: {Appearance:22683}  Eye Contact:  {BHH EYE CONTACT:22684}  Speech:  Clear and Coherent  Volume:  Normal  Mood:  {BHH MOOD:22306}  Affect:  {Affect (PAA):22687}  Thought Process:  Coherent  Orientation:  Full (Time, Place, and Person)  Thought Content: Logical   Suicidal Thoughts:  {ST/HT (PAA):22692}  Homicidal Thoughts:  {ST/HT (PAA):22692}  Memory:  Immediate;   Good  Judgement:  {Judgement (PAA):22694}  Insight:  {Insight (PAA):22695}  Psychomotor Activity:  Normal  Concentration:  Concentration: Good and Attention Span: Good  Recall:  Good  Fund of Knowledge: Good  Language: Good  Akathisia:  No  Handed:  Right  AIMS (if indicated): not done  Assets:  Communication Skills Desire for Improvement  ADL's:  Intact  Cognition: WNL  Sleep:  {BHH GOOD/FAIR/POOR:22877}   Screenings: PHQ2-9   Flowsheet Row Office Visit from 11/26/2018 in [place] from 11/13/2017 [facility] Initial Prenatal from 07/02/2017 in [place] Office Visit from 05/29/2017 in [place] Office Visit from 01/29/2017 in [facility]  PHQ-2 Total Score 2 0 0 0 0  PHQ-9 Total Score 7 -- 2 -- --       Assessment and Plan:  Michelle Page is a 45 y.o. year old female with a history of depression, who presents for follow up appointment for below.    1. MDD (major depressive disorder), recurrent, in partial remission ([place]) 2. Hallucinations Although exam is notable for fatigue, which she attributes to her just waking up before the appointment , there  has been significant improvement in depressive symptoms and ego dystonic paranoia/hallucinations since improvement in medication adherence. Will continue current medication regimen at this time.  Will continue sertraline and bupropion to target depression.  We will continue Geodon as adjunctive treatment for depression/hallucinations/paranoia.  Discussed potential risk of EPS and weight gain.   3. Insomnia, unspecified type Improving.  Will continue Ambien and trazodone as needed for insomnia.   # Loss of consciousness Improving.  She has an upcoming appointment with her neurologist for episodes of loss of consciousness.    Plan I have reviewed and updated plans as below 1.Continuesertraline200 mg daily 2.Continuebupropion 300 mg daily 3.ContinueGeodon20 mgin AM, 40 mg in PM- monitor weight gain 5.ContinueAmbien 5 mg at night15 tabs per month due to insurance restriction 6.ContinueTrazodone150 mgat night as needed for sleep 7. Next appointment: 1/11 at 8:40 for 20 mins, video -TSH wnl checked by history  Past trials of medication:citalopram, bupropion, venlafaxine, carbamazepine, quetiapine (drowsiness), risperidone, Abilify(weight gain)  The patient demonstrates the following risk factors for suicide: Chronic risk factors for suicide include:psychiatric disorder ofdepression. Acute risk factorsfor suicide include: unemployment. Protective factorsfor this patient  include: positive social support, responsibility to others (children, family), coping skills and hope for the future. Considering these factors, the overall suicide risk at this point appears to below. Patientisappropriate for outpatient follow up.  Neysa Hotter, MD 05/03/2020, 11:31 AM

## 2020-05-06 ENCOUNTER — Other Ambulatory Visit: Payer: Self-pay | Admitting: Psychiatry

## 2020-05-06 DIAGNOSIS — F3341 Major depressive disorder, recurrent, in partial remission: Secondary | ICD-10-CM

## 2020-05-10 ENCOUNTER — Telehealth: Payer: Medicaid Other | Admitting: Psychiatry

## 2020-05-10 ENCOUNTER — Other Ambulatory Visit: Payer: Self-pay

## 2020-05-10 ENCOUNTER — Telehealth: Payer: Self-pay | Admitting: Psychiatry
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
## 2020-05-10 NOTE — Telephone Encounter (Signed)
Sent link for video visit through Epic. Patient did not sign in. Called the patient  for appointment scheduled today. The patient did not answer the phone. Left voice message to contact the office.  

## 2020-05-11 ENCOUNTER — Telehealth (INDEPENDENT_AMBULATORY_CARE_PROVIDER_SITE_OTHER): Payer: Medicaid Other | Admitting: Psychiatry

## 2020-05-11 ENCOUNTER — Encounter: Payer: Self-pay | Admitting: Psychiatry

## 2020-05-11 ENCOUNTER — Other Ambulatory Visit: Payer: Self-pay

## 2020-05-11 DIAGNOSIS — F4310 Post-traumatic stress disorder, unspecified: Secondary | ICD-10-CM | POA: Diagnosis not present

## 2020-05-11 DIAGNOSIS — F3341 Major depressive disorder, recurrent, in partial remission: Secondary | ICD-10-CM

## 2020-05-11 MED ORDER — ZIPRASIDONE HCL 40 MG PO CAPS
40.0000 mg | ORAL_CAPSULE | Freq: Two times a day (BID) | ORAL | 1 refills | Status: DC
Start: 2020-05-11 — End: 2020-06-09

## 2020-05-11 MED ORDER — ZOLPIDEM TARTRATE 5 MG PO TABS: 5.0000 mg | ORAL_TABLET | Freq: Every evening | ORAL | 0 refills | Status: DC | PRN

## 2020-05-11 NOTE — Patient Instructions (Signed)
1.Continuesertraline200 mg daily 2.Continuebupropion 300 mg daily 3. Increase Geodon40 mg twice a day 4.  Please have EKG at your PCP office 5.ContinueAmbien 5 mg at night15 tabs per month due to insurance restriction 6.ContinueTrazodone150 mgat night as needed for sleep 7. Next appointment: 2/15 at 11:20

## 2020-05-24 ENCOUNTER — Telehealth: Payer: Self-pay

## 2020-05-24 NOTE — Telephone Encounter (Signed)
pt states she did not have a good night.  pt sounds as if she been crying, she states she got her medication confused and she just feeling really stressed out .
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
## 2020-05-24 NOTE — Telephone Encounter (Signed)
I would recommend her to stay on medication as directed at this time unless she had side effect from the medication. I would also advise her to make sure to have a follow up with her therapist/Mr. Eulas Post.

## 2020-05-26 ENCOUNTER — Ambulatory Visit: Payer: Self-pay | Admitting: Internal Medicine

## 2020-05-26 ENCOUNTER — Telehealth (INDEPENDENT_AMBULATORY_CARE_PROVIDER_SITE_OTHER): Payer: Medicaid Other | Admitting: Psychiatry

## 2020-05-26 ENCOUNTER — Other Ambulatory Visit: Payer: Self-pay

## 2020-05-26 ENCOUNTER — Encounter: Payer: Self-pay | Admitting: Psychiatry

## 2020-05-26 DIAGNOSIS — G4700 Insomnia, unspecified: Secondary | ICD-10-CM | POA: Diagnosis not present

## 2020-05-26 DIAGNOSIS — F4310 Post-traumatic stress disorder, unspecified: Secondary | ICD-10-CM

## 2020-05-26 DIAGNOSIS — F330 Major depressive disorder, recurrent, mild: Secondary | ICD-10-CM | POA: Diagnosis not present

## 2020-05-26 MED ORDER — OLANZAPINE 2.5 MG PO TABS: 2.5000 mg | ORAL_TABLET | Freq: Every day | ORAL | 1 refills | Status: DC

## 2020-06-07 ENCOUNTER — Ambulatory Visit (INDEPENDENT_AMBULATORY_CARE_PROVIDER_SITE_OTHER): Payer: Medicaid Other | Admitting: Clinical

## 2020-06-07 ENCOUNTER — Other Ambulatory Visit: Payer: Self-pay

## 2020-06-07 DIAGNOSIS — F251 Schizoaffective disorder, depressive type: Secondary | ICD-10-CM | POA: Diagnosis not present
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
## 2020-06-09 ENCOUNTER — Other Ambulatory Visit: Payer: Self-pay

## 2020-06-09 ENCOUNTER — Encounter: Payer: Self-pay | Admitting: Internal Medicine

## 2020-06-09 ENCOUNTER — Ambulatory Visit (INDEPENDENT_AMBULATORY_CARE_PROVIDER_SITE_OTHER): Payer: Medicaid Other | Admitting: Internal Medicine

## 2020-06-09 VITALS — BP 125/84 | HR 82 | Temp 98.1°F | Resp 18 | Ht 64.0 in | Wt 195.0 lb

## 2020-06-09 DIAGNOSIS — Z7689 Persons encountering health services in other specified circumstances: Secondary | ICD-10-CM | POA: Diagnosis not present

## 2020-06-09 DIAGNOSIS — F4310 Post-traumatic stress disorder, unspecified: Secondary | ICD-10-CM | POA: Insufficient documentation

## 2020-06-09 DIAGNOSIS — R87610 Atypical squamous cells of undetermined significance on cytologic smear of cervix (ASC-US): Secondary | ICD-10-CM | POA: Diagnosis not present

## 2020-06-09 DIAGNOSIS — F259 Schizoaffective disorder, unspecified: Secondary | ICD-10-CM

## 2020-06-09 DIAGNOSIS — Z1231 Encounter for screening mammogram for malignant neoplasm of breast: Secondary | ICD-10-CM

## 2020-06-09 NOTE — Progress Notes (Signed)
New Patient Office Visit  Subjective:  Patient ID: Michelle Page, female    DOB: [birth year]  Age: 45 y.o. MRN: [id number]  CC:  Chief Complaint  Patient presents with  . New Patient (Initial Visit)    New patient establishing care    HPI Michelle Page is a 45 year old female with past medical history of schizoaffective disorder, PTSD, insomnia, ASCUS of cervix and melanoma who presents for establishing care.  She is a former patient of Dr. Anastasio Champion.  She has been doing well overall.  She has a history of schizoaffective disorder and PTSD, for which she follows up with a psychiatrist and behavioral health therapist.  She is on multiple medications and has been tolerating them well.  She does not have any delusive thoughts currently.  Denies any suicidal ideation.  She has a history of ASCUS of cervix in 10/2018. She has not had follow-up with OB/GYN since then.  She has not had any mammography done yet.  She denies taking any vaccine.  Past Medical History:  Diagnosis Date  . Abnormal Pap smear    colpo/leep  . Anemia   . Anxiety   . Cancer [facility]) 2006   Skin  . Chlamydia infection   . Depression   . Depression    Phreesia 06/08/2020  . Fractures   . Gestational diabetes    metformin  . H/O candidiasis   . H/O varicella   . Headache(784.0)    migraines as a child   . Heart murmur   . High risk HPV infection   . Obesity (BMI 30-39.9) 01/27/2019  . Papanicolaou smear of cervix with positive high risk human papilloma virus (HPV) test 06/03/2017  . Pregnancy induced hypertension    1st & 2nd pregnancy  . Pregnancy induced hypertension    1st & 2nd pregnancy   . Two vessel umbilical cord, antepartum 11/26/2011  . Vaginal Pap smear, abnormal   . Vitamin D deficiency disease 01/27/2019  . Yeast infection     Past Surgical History:  Procedure Laterality Date  . APPENDECTOMY  08/14/2017  . COLPOSCOPY    . LAPAROSCOPIC APPENDECTOMY N/A 08/14/2017   Procedure:  APPENDECTOMY LAPAROSCOPIC;  Surgeon: Erroll Luna, MD;  Location: [place];  Service: General;  Laterality: N/A;  . LEEP    . TUBAL LIGATION N/A 01/22/2018   Procedure: POST PARTUM TUBAL LIGATION;  Surgeon: Gwynne Edinger, MD;  Location: [facility];  Service: Gynecology;  Laterality: N/A;  . WISDOM TOOTH EXTRACTION      Family History  Problem Relation Age of Onset  . Alcohol abuse Father   . Emphysema Father   . COPD Father   . Mental illness Father        PTSD  . Post-traumatic stress disorder Father   . Alcohol abuse Brother   . Stroke Brother   . Cancer Paternal Grandmother        skin cancer  . Multiple sclerosis Sister   . Depression Mother   . Bipolar disorder Mother   . Anxiety disorder Mother   . Asthma Son   . Heart disease Maternal Grandfather     Social History   Socioeconomic History  . Marital status: Significant Other    Spouse name: todd  . Number of children: 6  . Years of education: 52  . Highest education level: Not on file  Occupational History  . Occupation: unemployed    Comment: caregiver  Tobacco Use  . Smoking status:  Former Smoker    Types: Cigarettes  . Smokeless tobacco: Never Used  . Tobacco comment: quit smoking in2007  Vaping Use  . Vaping Use: Never used  Substance and Sexual Activity  . Alcohol use: No    Comment: occ before pregnancy  . Drug use: No  . Sexual activity: Yes    Birth control/protection: Surgical    Comment: tubal  Other Topics Concern  . Not on file  Social History Narrative   Divorced.Lives in home with 6 children and boyfriend of 4 years.   Unemployed.   Social Determinants of Health   Financial Resource Strain: Not on file  Food Insecurity: Not on file  Transportation Needs: Not on file  Physical Activity: Not on file  Stress: Not on file  Social Connections: Not on file  Intimate Partner Violence: Not on file    ROS Review of Systems  Constitutional: Negative for chills and fever.   HENT: Negative for congestion, sinus pressure, sinus pain and sore throat.   Eyes: Negative for pain and discharge.  Respiratory: Negative for cough and shortness of breath.   Cardiovascular: Negative for chest pain and palpitations.  Gastrointestinal: Negative for abdominal pain, constipation, diarrhea, nausea and vomiting.  Endocrine: Negative for polydipsia and polyuria.  Genitourinary: Negative for dysuria and hematuria.  Musculoskeletal: Negative for neck pain and neck stiffness.  Skin: Negative for rash.  Neurological: Negative for dizziness and weakness.  Psychiatric/Behavioral: Positive for sleep disturbance. Negative for agitation.    Objective:   Today's Vitals: BP 125/84 (BP Location: Right Arm, Patient Position: Sitting, Cuff Size: Normal)   Pulse 82   Temp 98.1 F (36.7 C) (Oral)   Resp 18   Ht 5' 4" (1.626 m)   Wt 195 lb (88.5 kg)   SpO2 97%   BMI 33.47 kg/m   Physical Exam Vitals reviewed.  Constitutional:      General: She is not in acute distress.    Appearance: She is not diaphoretic.  HENT:     Head: Normocephalic and atraumatic.     Nose: Nose normal.     Mouth/Throat:     Mouth: Mucous membranes are moist.  Eyes:     General: No scleral icterus.    Extraocular Movements: Extraocular movements intact.     Pupils: Pupils are equal, round, and reactive to light.  Cardiovascular:     Rate and Rhythm: Normal rate and regular rhythm.     Pulses: Normal pulses.     Heart sounds: No murmur heard.   Pulmonary:     Breath sounds: Normal breath sounds. No wheezing or rales.  Musculoskeletal:     Cervical back: Neck supple. No tenderness.     Right lower leg: No edema.     Left lower leg: No edema.  Skin:    General: Skin is warm.     Findings: No rash.  Neurological:     General: No focal deficit present.     Mental Status: She is alert and oriented to person, place, and time.  Psychiatric:        Mood and Affect: Mood normal.        Behavior:  Behavior normal.     Assessment & Plan:   Problem List Items Addressed This Visit      Other   ASCUS of cervix with negative high risk HPV    Last Pap smear in 10/2018. Advised to follow-up with OB/GYN      Encounter to establish care -  Primary    Care established Previous chart reviewed History and medications reviewed with the patient      Relevant Orders   CBC with Differential   CMP14+EGFR   Hemoglobin A1c   Lipid panel   Vitamin D (25 hydroxy)   TSH   Schizoaffective disorder (HCC)    On Olanzapine, Zoloft, trazodone and Wellbutrin Follows up with psychiatrist Has a behavioral health therapist      PTSD (post-traumatic stress disorder)    On Wellbutrin Ambien and trazodone for insomnia       Other Visit Diagnoses    Screening mammogram for breast cancer       Relevant Orders   MM 3D SCREEN BREAST BILATERAL      Outpatient Encounter Medications as of 06/09/2020  Medication Sig  . buPROPion (WELLBUTRIN XL) 300 MG 24 hr tablet Take 1 tablet (300 mg total) by mouth every morning.  Marland Kitchen OLANZapine (ZYPREXA) 2.5 MG tablet Take 1 tablet (2.5 mg total) by mouth at bedtime.  . sertraline (ZOLOFT) 100 MG tablet Take 2 tablets (200 mg total) by mouth daily.  . traZODone (DESYREL) 150 MG tablet Take 1 tablet (150 mg total) by mouth at bedtime.  Marland Kitchen zolpidem (AMBIEN) 5 MG tablet Take 1 tablet (5 mg total) by mouth at bedtime as needed for sleep.  . [DISCONTINUED] ziprasidone (GEODON) 20 MG capsule 20 mg in AM, 40 mg in PM. Take along with 40 mg tab (Patient not taking: Reported on 06/09/2020)  . [DISCONTINUED] ziprasidone (GEODON) 40 MG capsule Take 1 capsule (40 mg total) by mouth 2 (two) times daily with a meal. (Patient not taking: Reported on 06/09/2020)   No facility-administered encounter medications on file as of 06/09/2020.    Follow-up: Return in about 6 weeks (around 07/21/2020) for Annual physical.   Lindell Spar, MD

## 2020-06-09 NOTE — Assessment & Plan Note (Signed)
Last Pap smear in 10/2018. Advised to follow-up with OB/GYN 

## 2020-06-09 NOTE — Assessment & Plan Note (Signed)
On Olanzapine, Zoloft, trazodone and Wellbutrin Follows up with psychiatrist Has a behavioral health therapist 

## 2020-06-09 NOTE — Assessment & Plan Note (Signed)
Care established Previous chart reviewed History and medications reviewed with the patient 

## 2020-06-09 NOTE — Patient Instructions (Signed)
Please continue to take medications as prescribed.  Please schedule an appointment with OB/GYN for PAP smear.  Please get fasting blood tests done before the next visit.  You are being scheduled to get Mammography done.

## 2020-06-09 NOTE — Assessment & Plan Note (Signed)
On Wellbutrin Ambien and trazodone for insomnia

## 2020-06-13 ENCOUNTER — Ambulatory Visit (HOSPITAL_COMMUNITY): Payer: Medicaid Other

## 2020-06-14 ENCOUNTER — Encounter: Payer: Self-pay | Admitting: Psychiatry

## 2020-06-14 ENCOUNTER — Telehealth (INDEPENDENT_AMBULATORY_CARE_PROVIDER_SITE_OTHER): Payer: Medicaid Other | Admitting: Psychiatry

## 2020-06-14 ENCOUNTER — Other Ambulatory Visit: Payer: Self-pay

## 2020-06-14 DIAGNOSIS — F330 Major depressive disorder, recurrent, mild: Secondary | ICD-10-CM

## 2020-06-14 DIAGNOSIS — F4310 Post-traumatic stress disorder, unspecified: Secondary | ICD-10-CM | POA: Diagnosis not present

## 2020-06-14 NOTE — Patient Instructions (Signed)
1.Continuesertraline200 mg daily 2.Continuebupropion 300 mg daily 3. Increase olanzapine 5 mg at night  4.ContinueAmbien 5 mg at night15 tabs per month due to insurance restriction 5.ContinueTrazodone150 mgat night as needed for sleep 6. Next appointment:4/5 at 11:30

## 2020-06-16 ENCOUNTER — Ambulatory Visit (HOSPITAL_COMMUNITY): Payer: Medicaid Other

## 2020-06-16 NOTE — Telephone Encounter (Signed)
Not  sure what is going on with this patient phone but I have tried several time for the last few weeks and the phone will ring like 5 times and hang up.
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
## 2020-06-23 ENCOUNTER — Encounter: Payer: Self-pay | Admitting: Psychiatry

## 2020-06-23 ENCOUNTER — Telehealth: Payer: Self-pay

## 2020-06-23 ENCOUNTER — Other Ambulatory Visit: Payer: Self-pay

## 2020-06-23 ENCOUNTER — Telehealth (INDEPENDENT_AMBULATORY_CARE_PROVIDER_SITE_OTHER): Payer: Medicaid Other | Admitting: Psychiatry

## 2020-06-23 DIAGNOSIS — F3341 Major depressive disorder, recurrent, in partial remission: Secondary | ICD-10-CM

## 2020-06-23 DIAGNOSIS — F330 Major depressive disorder, recurrent, mild: Secondary | ICD-10-CM | POA: Diagnosis not present

## 2020-06-23 DIAGNOSIS — F4310 Post-traumatic stress disorder, unspecified: Secondary | ICD-10-CM

## 2020-06-23 MED ORDER — TRAZODONE HCL 150 MG PO TABS
150.0000 mg | ORAL_TABLET | Freq: Every day | ORAL | 0 refills | Status: DC
Start: 2020-06-23 — End: 2020-07-28

## 2020-06-23 MED ORDER — BUPROPION HCL ER (XL) 300 MG PO TB24: 300.0000 mg | ORAL_TABLET | ORAL | 0 refills | Status: DC

## 2020-06-23 MED ORDER — SERTRALINE HCL 100 MG PO TABS: 200.0000 mg | ORAL_TABLET | Freq: Every day | ORAL | 0 refills | Status: DC

## 2020-06-23 MED ORDER — ZOLPIDEM TARTRATE 5 MG PO TABS: 5.0000 mg | ORAL_TABLET | Freq: Every evening | ORAL | 1 refills | Status: DC | PRN

## 2020-06-23 MED ORDER — OLANZAPINE 5 MG PO TABS: 5.0000 mg | ORAL_TABLET | Freq: Every day | ORAL | 1 refills | Status: DC

## 2020-06-23 NOTE — Telephone Encounter (Signed)
spoke with patient about [facility] trying to contact her to get an appt . pt stated that she wanted to go to [facility] in [place],  oked this with dr. Modesta Messing and a referral was faxed and confirmed to [facility] in [place] fax # [phone]

## 2020-06-24 ENCOUNTER — Telehealth: Payer: Self-pay

## 2020-06-24 NOTE — Telephone Encounter (Signed)
error 

## 2020-07-07 ENCOUNTER — Other Ambulatory Visit: Payer: Self-pay

## 2020-07-07 ENCOUNTER — Ambulatory Visit (INDEPENDENT_AMBULATORY_CARE_PROVIDER_SITE_OTHER): Payer: Medicaid Other | Admitting: Clinical

## 2020-07-07 DIAGNOSIS — F251 Schizoaffective disorder, depressive type: Secondary | ICD-10-CM

## 2020-07-07 NOTE — Progress Notes (Signed)
Virtual Visit via Telephone Note  I connected with Michelle Page on 07/07/20 at 10:00 AM EST by telephone and verified that I am speaking with the correct person using two identifiers.  Location: Patient: Home Provider: Office   I discussed the limitations, risks, security and privacy concerns of performing an evaluation and management service by telephone and the availability of in person appointments. I also discussed with the patient that there may be a patient responsible charge related to this service. The patient expressed understanding and agreed to proceed.    THERAPIST PROGRESS NOTE  Session Time:10:00AM-10:30AM  Participation Level:Active  Behavioral Response:DisheveledAlertDepressed  Type of Therapy:Individual Therapy  Treatment Goals addressed:Coping  Interventions:CBT and Motivational Interviewing  Summary:Michelle M Chappellis a 45 y.o.femalewho presents with Schizoaffective Disorder Depressive type.The OPT therapist worked with thepatientfor herinitial OPT. The OPT therapist utilized Motivational Interviewing to assist in creating therapeutic repore. The patient in the session was engaged and work in Science writer about hertriggers and symptoms over the past few weeks. The patient in this session spoke about her difficulty with sleep. The OPT therapist utilized Cognitive Behavioral Therapy through cognitive restructuring as well as worked with the patient on coping strategies to assist in management of symptoms. The OPT therapist inquired for holistic care about the patients adherence to medication therapy.  Suicidal/Homicidal:Nowithout intent/plan  Therapist Response:The OPT therapist worked with the patient for the patients scheduled session. The patient was engaged in hersession and gave feedback in relation to triggers, symptoms, and behavior responses over the past fewweeks. The OPT therapist worked with the patient  utilizing an in session Cognitive Behavioral Therapy exercise. The patient was responsive in the session and verbalized, " I have not had a lot of energy my sleep is not normal I am up and down  At night sometimes my medication helps but sometimes it seems like it does the exact opposite and keeps me energized all night". The patient indicated both compliance, but deniedeffectiveness in relation to hercurrent medication therapy. The patient will review with her prescriber.The OPT therapist will continue treatment work with the patient in hernext scheduled session.  Plan: Return again in2/3weeks.  Diagnosis:Axis I:Major depressive disorder, recurrent, severe with psychotic features  Axis II:No diagnosis  I discussed the assessment and treatment plan with the patient. The patient was provided an opportunity to ask questions and all were answered. The patient agreed with the plan and demonstrated an understanding of the instructions.  The patient was advised to call back or seek an in-person evaluation if the symptoms worsen or if the condition fails to improve as anticipated.  I provided5minutes of non-face-to-face time during this encounter.  Lennox Grumbles, LCSW  07/07/2020

## 2020-07-14 ENCOUNTER — Other Ambulatory Visit: Payer: Medicaid Other | Admitting: Women's Health

## 2020-07-21 ENCOUNTER — Ambulatory Visit (INDEPENDENT_AMBULATORY_CARE_PROVIDER_SITE_OTHER): Payer: Medicaid Other | Admitting: Internal Medicine

## 2020-07-21 ENCOUNTER — Encounter: Payer: Self-pay | Admitting: Internal Medicine

## 2020-07-21 ENCOUNTER — Other Ambulatory Visit: Payer: Self-pay

## 2020-07-21 VITALS — BP 129/88 | HR 74 | Resp 18 | Ht 64.0 in | Wt 195.1 lb

## 2020-07-21 DIAGNOSIS — R42 Dizziness and giddiness: Secondary | ICD-10-CM | POA: Diagnosis not present

## 2020-07-21 DIAGNOSIS — R87610 Atypical squamous cells of undetermined significance on cytologic smear of cervix (ASC-US): Secondary | ICD-10-CM

## 2020-07-21 DIAGNOSIS — F259 Schizoaffective disorder, unspecified: Secondary | ICD-10-CM

## 2020-07-21 DIAGNOSIS — Z0000 Encounter for general adult medical examination without abnormal findings: Secondary | ICD-10-CM | POA: Diagnosis not present

## 2020-07-21 NOTE — Assessment & Plan Note (Signed)
On Olanzapine, Zoloft, trazodone and Wellbutrin Follows up with psychiatrist Has a behavioral health therapist

## 2020-07-21 NOTE — Assessment & Plan Note (Signed)
States that she has blacking out spells for few seconds, unclear etiology No room spinning sensation, no hearing problem, no gait problem Denies chest pain, dyspnea or palpitations No vision problem at baseline Referred to Neurology as suggested by Psychiatry

## 2020-07-21 NOTE — Assessment & Plan Note (Signed)
Last Pap smear in 10/2018. Advised to follow-up with OB/GYN

## 2020-07-21 NOTE — Patient Instructions (Signed)
Please schedule appointment with OB/GYN for PAP smear.  You are being referred to Neurology for evaluation of lightheadedness.  Please get fasting blood tests done within a week.

## 2020-07-21 NOTE — Assessment & Plan Note (Signed)
Annual exam as documented. Counseling done  re healthy lifestyle involving commitment to 150 minutes exercise per week, heart healthy diet, and attaining healthy weight.The importance of adequate sleep also discussed. Changes in health habits are decided on by the patient with goals and time frames  set for achieving them. Immunization and cancer screening needs are specifically addressed at this visit. 

## 2020-07-21 NOTE — Progress Notes (Signed)
Established Patient Office Visit  Subjective:  Patient ID: Michelle Page, female    DOB: [birth year]  Age: 45 y.o. MRN: [id number]  CC:  Chief Complaint  Patient presents with  . Annual Exam    Pt saw psychiatry they wanted to know if you would refer her to neurologist as she has been having mini black out spells     HPI Michelle Page  is a 45 year old female with past medical history of schizoaffective disorder, PTSD, insomnia, ASCUS of cervix and melanoma who presents for annual physical.  She states that she has been having intermittent lightheadedness - blacking out sensation, which lasts about few seconds, she is unable to state exactly how long. She later states that she feels as if its just her blinking. She denies any seizure-like activity. Denies any syncopal event. Denies any change with position or room spinning sensation. Denies any numbness, weakness or tingling. She wears glasses, and denies any floaters or blurry vision at baseline. She admits to having some shaking sensation - tremors over her hands, but she takes almost 750 ml of soft drinks every day.  She has not had blood tests done yet.  She has not followed up with OB/GYN for abnormal PAP last time in 2020.  Past Medical History:  Diagnosis Date  . Abnormal Pap smear    colpo/leep  . Anemia   . Anxiety   . Cancer [facility]) 2006   Skin  . Chlamydia infection   . Depression   . Depression    Phreesia 06/08/2020  . Depression    Phreesia 07/20/2020  . Fractures   . Gestational diabetes    metformin  . H/O candidiasis   . H/O varicella   . Headache(784.0)    migraines as a child   . Heart murmur   . High risk HPV infection   . Melanoma of face [facility]) 01/29/2017   2006  Formatting of this note might be different from the original. 2006  . Obesity (BMI 30-39.9) 01/27/2019  . Papanicolaou smear of cervix with positive high risk human papilloma virus (HPV) test 06/03/2017  . Pregnancy induced  hypertension    1st & 2nd pregnancy  . Pregnancy induced hypertension    1st & 2nd pregnancy   . Two vessel umbilical cord, antepartum 11/26/2011  . Vaginal Pap smear, abnormal   . Vitamin D deficiency disease 01/27/2019  . Yeast infection     Past Surgical History:  Procedure Laterality Date  . APPENDECTOMY  08/14/2017  . COLPOSCOPY    . LAPAROSCOPIC APPENDECTOMY N/A 08/14/2017   Procedure: APPENDECTOMY LAPAROSCOPIC;  Surgeon: Erroll Luna, MD;  Location: [place];  Service: General;  Laterality: N/A;  . LEEP    . TUBAL LIGATION N/A 01/22/2018   Procedure: POST PARTUM TUBAL LIGATION;  Surgeon: Gwynne Edinger, MD;  Location: [place];  Service: Gynecology;  Laterality: N/A;  . WISDOM TOOTH EXTRACTION      Family History  Problem Relation Age of Onset  . Alcohol abuse Father   . Emphysema Father   . COPD Father   . Mental illness Father        PTSD  . Post-traumatic stress disorder Father   . Alcohol abuse Brother   . Stroke Brother   . Cancer Paternal Grandmother        skin cancer  . Multiple sclerosis Sister   . Depression Mother   . Bipolar disorder Mother   . Anxiety disorder Mother   .  Asthma Son   . Heart disease Maternal Grandfather     Social History   Socioeconomic History  . Marital status: Significant Other    Spouse name: todd  . Number of children: 6  . Years of education: 42  . Highest education level: Not on file  Occupational History  . Occupation: unemployed    Comment: caregiver  Tobacco Use  . Smoking status: Former Smoker    Types: Cigarettes  . Smokeless tobacco: Never Used  . Tobacco comment: quit smoking in2007  Vaping Use  . Vaping Use: Never used  Substance and Sexual Activity  . Alcohol use: No    Comment: occ before pregnancy  . Drug use: No  . Sexual activity: Yes    Birth control/protection: Surgical    Comment: tubal  Other Topics Concern  . Not on file  Social History Narrative   Divorced.Lives in home with  6 children and boyfriend of 4 years.   Unemployed.   Social Determinants of Health   Financial Resource Strain: Not on file  Food Insecurity: Not on file  Transportation Needs: Not on file  Physical Activity: Not on file  Stress: Not on file  Social Connections: Not on file  Intimate Partner Violence: Not on file    Outpatient Medications Prior to Visit  Medication Sig Dispense Refill  . buPROPion (WELLBUTRIN XL) 300 MG 24 hr tablet Take 1 tablet (300 mg total) by mouth every morning. 90 tablet 0  . OLANZapine (ZYPREXA) 5 MG tablet Take 1 tablet (5 mg total) by mouth at bedtime. (Patient taking differently: Take 10 mg by mouth at bedtime.) 30 tablet 1  . sertraline (ZOLOFT) 100 MG tablet Take 2 tablets (200 mg total) by mouth daily. 180 tablet 0  . traZODone (DESYREL) 150 MG tablet Take 1 tablet (150 mg total) by mouth at bedtime. 90 tablet 0  . zolpidem (AMBIEN) 5 MG tablet Take 1 tablet (5 mg total) by mouth at bedtime as needed for sleep. 15 tablet 1   No facility-administered medications prior to visit.    No Known Allergies  ROS Review of Systems  Constitutional: Negative for chills and fever.  HENT: Negative for congestion, sinus pressure, sinus pain and sore throat.   Eyes: Negative for pain and discharge.  Respiratory: Negative for cough and shortness of breath.   Cardiovascular: Negative for chest pain and palpitations.  Gastrointestinal: Negative for abdominal pain, constipation, diarrhea, nausea and vomiting.  Endocrine: Negative for polydipsia and polyuria.  Genitourinary: Negative for dysuria and hematuria.  Musculoskeletal: Negative for neck pain and neck stiffness.  Skin: Negative for rash.  Neurological: Positive for light-headedness. Negative for dizziness and weakness.  Psychiatric/Behavioral: Positive for sleep disturbance. Negative for agitation.      Objective:    Physical Exam Vitals reviewed.  Constitutional:      General: She is not in acute  distress.    Appearance: She is not diaphoretic.  HENT:     Head: Normocephalic and atraumatic.     Nose: Nose normal.     Mouth/Throat:     Mouth: Mucous membranes are moist.  Eyes:     General: No scleral icterus.    Extraocular Movements: Extraocular movements intact.     Pupils: Pupils are equal, round, and reactive to light.  Cardiovascular:     Rate and Rhythm: Normal rate and regular rhythm.     Pulses: Normal pulses.     Heart sounds: No murmur heard.   Pulmonary:  Breath sounds: Normal breath sounds. No wheezing or rales.  Abdominal:     Palpations: Abdomen is soft.     Tenderness: There is no abdominal tenderness.  Musculoskeletal:     Cervical back: Neck supple. No tenderness.     Right lower leg: No edema.     Left lower leg: No edema.  Skin:    General: Skin is warm.     Findings: No rash.  Neurological:     General: No focal deficit present.     Mental Status: She is alert and oriented to person, place, and time.  Psychiatric:        Mood and Affect: Mood normal.        Behavior: Behavior normal.     BP 129/88 (BP Location: Right Arm, Patient Position: Sitting, Cuff Size: Normal)   Pulse 74   Resp 18   Ht 5\' 4"  (1.626 m)   Wt 195 lb 1.9 oz (88.5 kg)   SpO2 96%   BMI 33.49 kg/m  Wt Readings from Last 3 Encounters:  07/21/20 195 lb 1.9 oz (88.5 kg)  06/09/20 195 lb (88.5 kg)  01/27/19 193 lb 3.2 oz (87.6 kg)     Health Maintenance Due  Topic Date Due  . COVID-19 Vaccine (1) Never done    There are no preventive care reminders to display for this patient.  Lab Results  Component Value Date   TSH 2.13 01/27/2019   Lab Results  Component Value Date   WBC 4.9 01/27/2019   HGB 12.1 01/27/2019   HCT 36.0 01/27/2019   MCV 87.8 01/27/2019   PLT 146 01/27/2019   Lab Results  Component Value Date   NA 139 01/27/2019   K 3.7 01/27/2019   CO2 25 01/27/2019   GLUCOSE 123 (H) 01/27/2019   BUN 13 01/27/2019   CREATININE 0.78 01/27/2019    BILITOT 0.3 01/27/2019   ALKPHOS 48 08/13/2017   AST 23 01/27/2019   ALT 14 01/27/2019   PROT 7.1 01/27/2019   ALBUMIN 3.3 (L) 08/13/2017   CALCIUM 9.2 01/27/2019   ANIONGAP 9 08/13/2017   Lab Results  Component Value Date   CHOL 148 01/29/2017   Lab Results  Component Value Date   HDL 45 (L) 01/29/2017   Lab Results  Component Value Date   LDLCALC 82 01/29/2017   Lab Results  Component Value Date   TRIG 115 01/29/2017   Lab Results  Component Value Date   CHOLHDL 3.3 01/29/2017   No results found for: HGBA1C    Assessment & Plan:   Problem List Items Addressed This Visit         Annual physical exam - Primary   Annual exam as documented. Counseling done  re healthy lifestyle involving commitment to 150 minutes exercise per week, heart healthy diet, and attaining healthy weight.The importance of adequate sleep also discussed. Changes in health habits are decided on by the patient with goals and time frames  set for achieving them. Immunization and cancer screening needs are specifically addressed at this visit.       ASCUS of cervix with negative high risk HPV    Last Pap smear in 10/2018. Advised to follow-up with OB/GYN      Schizoaffective disorder ([place])    On Olanzapine, Zoloft, trazodone and Wellbutrin Follows up with psychiatrist Has a behavioral health therapist            Intermittent lightheadedness    States that she has blacking  out spells for few seconds, unclear etiology No room spinning sensation, no hearing problem, no gait problem Denies chest pain, dyspnea or palpitations No vision problem at baseline Referred to Neurology as suggested by Psychiatry      Relevant Orders   Ambulatory referral to Neurology      No orders of the defined types were placed in this encounter.   Follow-up: Return in about 6 months (around 01/21/2021).    Lindell Spar, MD
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
## 2020-07-28 ENCOUNTER — Other Ambulatory Visit: Payer: Self-pay

## 2020-07-28 ENCOUNTER — Encounter: Payer: Self-pay | Admitting: Psychiatry

## 2020-07-28 ENCOUNTER — Telehealth (INDEPENDENT_AMBULATORY_CARE_PROVIDER_SITE_OTHER): Payer: Medicaid Other | Admitting: Psychiatry

## 2020-07-28 DIAGNOSIS — F330 Major depressive disorder, recurrent, mild: Secondary | ICD-10-CM | POA: Diagnosis not present

## 2020-07-28 DIAGNOSIS — F4310 Post-traumatic stress disorder, unspecified: Secondary | ICD-10-CM | POA: Diagnosis not present

## 2020-07-28 DIAGNOSIS — F3341 Major depressive disorder, recurrent, in partial remission: Secondary | ICD-10-CM | POA: Diagnosis not present

## 2020-07-28 DIAGNOSIS — G4700 Insomnia, unspecified: Secondary | ICD-10-CM | POA: Diagnosis not present

## 2020-07-28 MED ORDER — BUPROPION HCL ER (XL) 300 MG PO TB24: 300.0000 mg | ORAL_TABLET | ORAL | 0 refills | Status: DC

## 2020-07-28 MED ORDER — OLANZAPINE 5 MG PO TABS: 5.0000 mg | ORAL_TABLET | Freq: Every day | ORAL | 0 refills | Status: DC

## 2020-07-28 MED ORDER — ZOLPIDEM TARTRATE 5 MG PO TABS: 5.0000 mg | ORAL_TABLET | Freq: Every evening | ORAL | 1 refills | Status: DC | PRN

## 2020-07-28 MED ORDER — TRAZODONE HCL 150 MG PO TABS: 150.0000 mg | ORAL_TABLET | Freq: Every day | ORAL | 0 refills | Status: DC

## 2020-08-01 ENCOUNTER — Ambulatory Visit (HOSPITAL_COMMUNITY): Payer: Medicaid Other | Admitting: Clinical

## 2020-08-01 ENCOUNTER — Other Ambulatory Visit: Payer: Self-pay

## 2020-08-01 ENCOUNTER — Telehealth (HOSPITAL_COMMUNITY): Payer: Self-pay | Admitting: Clinical

## 2020-08-01 NOTE — Telephone Encounter (Signed)
The pt did not respond to video link, call, or VM

## 2020-08-02 ENCOUNTER — Telehealth: Payer: Medicaid Other | Admitting: Psychiatry

## 2020-08-14 NOTE — Progress Notes (Deleted)
   WELL-WOMAN EXAMINATION Patient name: Michelle Page MRN [id number]  Date of birth: 04-07-1976 Chief Complaint:   No chief complaint on file.  History of Present Illness:   Michelle Page is a 45 y.o. [id number] {Race/ethnicity:17218} female being seen today for a routine well-woman exam.  Today she notes: ***   No LMP recorded. Denies issues with her menses The current method of family planning is tubal ligation.    Last pap 2020- ASCUS, HPV neg, []  repeat cotest 2023.  Last mammogram: ***. Last colonoscopy: ***  Depression screen [facility] 2/9 07/21/2020 06/09/2020 11/26/2018 11/26/2018 11/18/2017  Decreased Interest 0 0 1 1 0  Down, Depressed, Hopeless 0 0 1 1 0  PHQ - 2 Score 0 0 2 2 0  Altered sleeping - 3 1 - -  Tired, decreased energy - 3 1 - -  Change in appetite - 3 1 - -  Feeling bad or failure about yourself  - 1 0 - -  Trouble concentrating - 3 1 - -  Moving slowly or fidgety/restless - 1 1 - -  Suicidal thoughts - 0 0 - -  PHQ-9 Score - 14 7 - -  Difficult doing work/chores - Somewhat difficult - - -  Some encounter information is confidential and restricted. Go to Review Flowsheets activity to see all data.      Review of Systems:   Pertinent items are noted in HPI Denies any headaches, blurred vision, fatigue, shortness of breath, chest pain, abdominal pain, bowel movements, urination, or intercourse unless otherwise stated above.  Pertinent History Reviewed:  Reviewed past medical,surgical, social and family history.  Reviewed problem list, medications and allergies. Physical Assessment:  There were no vitals filed for this visit.There is no height or weight on file to calculate BMI.        Physical Examination:   General appearance - well appearing, and in no distress  Mental status - alert, oriented to person, place, and time  Psych:  She has a normal mood and affect  Skin - warm and dry, normal color, no suspicious lesions noted  Chest - effort normal,  all lung fields clear to auscultation bilaterally  Heart - normal rate and regular rhythm  Neck:  midline trachea, no thyromegaly or nodules  Breasts - breasts appear normal, no suspicious masses, no skin or nipple changes or  axillary nodes  Abdomen - soft, nontender, nondistended, no masses or organomegaly  Pelvic - VULVA: normal appearing vulva with no masses, tenderness or lesions  VAGINA: normal appearing vagina with normal color and discharge, no lesions  CERVIX: normal appearing cervix without discharge or lesions, no CMT  Thin prep pap is {Desc; done/not:10129} *** HR HPV cotesting  UTERUS: uterus is felt to be normal size, shape, consistency and nontender   ADNEXA: No adnexal masses or tenderness noted.  Rectal - normal rectal, good sphincter tone, no masses felt. Hemoccult: ***  Extremities:  No swelling or varicosities noted  Chaperone: {Chaperone:19197::"N/A","Latisha Cresenzo","Janet Young","Amanda Andrews","Peggy Dones","Nicole Jones","Angel Neas"}     Assessment & Plan:  1) Well-Woman Exam ***  2) ***  No orders of the defined types were placed in this encounter.   Meds: No orders of the defined types were placed in this encounter.   Follow-up: No follow-ups on file.   Janyth Pupa, DO Attending [place], [facility] for [employer], [place]

## 2020-08-16 DIAGNOSIS — F419 Anxiety disorder, unspecified: Secondary | ICD-10-CM | POA: Diagnosis present

## 2020-08-16 DIAGNOSIS — R519 Headache, unspecified: Secondary | ICD-10-CM | POA: Insufficient documentation

## 2020-08-17 ENCOUNTER — Other Ambulatory Visit: Payer: Medicaid Other | Admitting: Obstetrics & Gynecology

## 2020-08-18 ENCOUNTER — Ambulatory Visit (INDEPENDENT_AMBULATORY_CARE_PROVIDER_SITE_OTHER): Payer: Medicaid Other | Admitting: Clinical

## 2020-08-18 ENCOUNTER — Other Ambulatory Visit: Payer: Self-pay

## 2020-08-18 DIAGNOSIS — F251 Schizoaffective disorder, depressive type: Secondary | ICD-10-CM | POA: Diagnosis not present

## 2020-08-18 NOTE — Progress Notes (Signed)
Virtual Visit via Telephone Note  I connected withTuesday STEFANA Page on 08/18/20 at 9:00 AM EST by telephoneand verified that I am speaking with the correct person using two identifiers.  Location: Patient: Home Provider: Office  I discussed the limitations, risks, security and privacy concerns of performing an evaluation and management service by telephone and the availability of in person appointments. I also discussed with the patient that there may be a patient responsible charge related to this service. The patient expressed understanding and agreed to proceed.    THERAPIST PROGRESS NOTE  Session Time:9:00AM-9:30AM  Participation Level:Active  Behavioral Response:DisheveledAlertDepressed  Type of Therapy:Individual Therapy  Treatment Goals addressed:Coping  Interventions:CBT and Motivational Interviewing  Summary:Michelle Page a 45 y.o.femalewho presents with Schizoaffective Disorder Depressive type.The OPT therapist worked with thepatientfor herongoing OPT. The OPT therapist utilized Motivational Interviewing to assist in creating therapeutic repore. The patient in the session was engaged and work in Science writer about hertriggers and symptoms over the past few weeks.The patient in this session spoke about having a good Easter.The OPT therapist utilized Cognitive Behavioral Therapy through cognitive restructuring as well as worked with the patient on coping strategies to assist in management of symptoms. The OPT therapist inquired for holistic care about the patients adherence to medication therapy.  Suicidal/Homicidal:Nowithout intent/plan  Therapist Response:The OPT therapist worked with the patient for the patients scheduled session. The patient was engaged in hersession and gave feedback in relation to triggers, symptoms, and behavior responses over the past fewweeks. The OPT therapist worked with the patient  utilizing an in session Cognitive Behavioral Therapy exercise. The patient was responsive in the session and verbalized, " Michelle Page a disconnect sometimes between what my brain is saying and what I say and sometimes I get these feelings that transfer into thoughts that's not even real but when I get triggered it goes through this process like If I see something next thing you know I feel like I can taste it". The patient indicated both compliance, but deniedeffectiveness in relation to hercurrent medication therapy.The OPT therapist urged the patient to stay on her prescribed medication and take this as indicated to assist in managing her psychosis. The patient spoke about struggle with her insurance provider covering the cost of her medication. The patient spoke about sleep cycle and not getting a consistent nights sleep and this makes things the next day that is small seem like a bigger deal.  The patient verbalized a understanding that her visual hallucinations not being real, however, she still is currently having visual hallucinations. The patient notes she is trying to use coping to help with making her hallucinations go away and sometimes this is helpful.The OPT therapist will continue treatment work with the patient in hernext scheduled session.  Plan: Return again in2/3weeks.  Diagnosis:Axis I:Major depressive disorder, recurrent, severe with psychotic features  Axis II:No diagnosis  I discussed the assessment and treatment plan with the patient. The patient was provided an opportunity to ask questions and all were answered. The patient agreed with the plan and demonstrated an understanding of the instructions.  The patient was advised to call back or seek an in-person evaluation if the symptoms worsen or if the condition fails to improve as anticipated.  I provided15minutes of non-face-to-face time during this encounter.  Lennox Grumbles,  LCSW  08/18/2020

## 2020-09-01 ENCOUNTER — Telehealth: Payer: Self-pay

## 2020-09-01 NOTE — Telephone Encounter (Signed)
error 

## 2020-09-14 ENCOUNTER — Ambulatory Visit (INDEPENDENT_AMBULATORY_CARE_PROVIDER_SITE_OTHER): Payer: Medicaid Other | Admitting: Clinical

## 2020-09-14 ENCOUNTER — Other Ambulatory Visit: Payer: Self-pay

## 2020-09-14 DIAGNOSIS — F251 Schizoaffective disorder, depressive type: Secondary | ICD-10-CM

## 2020-09-14 NOTE — Progress Notes (Signed)
Virtual Visit via Telephone Note  I connected withTuesday M Chappellon 05/18/22at 9:00 AM ESTby telephoneand verified that I am speaking with the correct person using two identifiers.  Location: Patient:Home Provider:Office  I discussed the limitations, risks, security and privacy concerns of performing an evaluation and management service by telephone and the availability of in person appointments. I also discussed with the patient that there may be a patient responsible charge related to this service. The patient expressed understanding and agreed to proceed.    THERAPIST PROGRESS NOTE  Session Time:9:00AM-9:30AM  Participation Level:Active  Behavioral Response:DisheveledAlertDepressed  Type of Therapy:Individual Therapy  Treatment Goals addressed:Coping  Interventions:CBT and Motivational Interviewing  Summary:Michelle M Chappellis a 45y.o.femalewho presents withSchizoaffective Disorder Depressive type.The OPT therapist worked with thepatientfor herongoingOPT. The OPT therapist utilized Motivational Interviewing to assist in creating therapeutic repore. The patient in the session was engaged and work in Science writer about hertriggers and symptoms over the past few weeks.The patientin this session spoke about having some good days and some bad days, and noted she is taking on the responsibility of taking her daughter back and forth to work.The patient continues to have difficulty with sleep and hallucinations.The OPT therapist utilized Cognitive Behavioral Therapy through cognitive restructuring as well as worked with the patient on coping strategies to assist in management of symptoms. The OPT therapist inquired for holistic care about the patients adherence to medication therapy and encouraged to patient to be vocal about her symptoms in her upcoming meeting with Dr. Modesta Messing on [place]  intent/plan  Therapist Response:The [place] therapist worked with the patient for the patients scheduled session. The patient was engaged in hersession and gave feedback in relation to triggers, symptoms, and behavior responses over the past fewweeks. The OPT therapist worked with the patient utilizing an in session Cognitive Behavioral Therapy exercise. The patient was responsive in the session and verbalized, " My medicine, usually at first the medicine helps but then its benefit fizzles out". The patient indicated both compliance, but deniedeffectiveness in relation to hercurrent medication therapy.The OPT therapist urged the patient to stay on her prescribed medication and take this as indicated to assist in managing her psychosis. The patient spoke about sleep cycle and not getting a consistent nights sleep and this makes things the next day that is small seem like a bigger deal.  The patient verbalized a understanding that her visual hallucinations not being real, however, she still is currently having visual and auditory hallucinations. The OPT therapist will continue treatment work with the patient in hernext scheduled session.  Plan: Return again in2/3weeks.  Diagnosis:Axis I:Major depressive disorder, recurrent, severe with psychotic features  Axis II:No diagnosis  I discussed the assessment and treatment plan with the patient. The patient was provided an opportunity to ask questions and all were answered. The patient agreed with the plan and demonstrated an understanding of the instructions.  The patient was advised to call back or seek an in-person evaluation if the symptoms worsen or if the condition fails to improve as anticipated.  I provided54minutes of non-face-to-face time during this encounter.  Michelle Grumbles, LCSW  09/14/2020
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
## 2020-09-22 ENCOUNTER — Telehealth: Payer: Medicaid Other | Admitting: Psychiatry

## 2020-09-28 ENCOUNTER — Telehealth (INDEPENDENT_AMBULATORY_CARE_PROVIDER_SITE_OTHER): Payer: Medicaid Other | Admitting: Psychiatry

## 2020-09-28 ENCOUNTER — Encounter: Payer: Self-pay | Admitting: Psychiatry

## 2020-09-28 ENCOUNTER — Other Ambulatory Visit: Payer: Self-pay

## 2020-09-28 DIAGNOSIS — F4310 Post-traumatic stress disorder, unspecified: Secondary | ICD-10-CM | POA: Diagnosis not present

## 2020-09-28 DIAGNOSIS — F330 Major depressive disorder, recurrent, mild: Secondary | ICD-10-CM

## 2020-09-28 DIAGNOSIS — G4700 Insomnia, unspecified: Secondary | ICD-10-CM

## 2020-09-28 MED ORDER — OLANZAPINE 5 MG PO TABS: 5.0000 mg | ORAL_TABLET | Freq: Every day | ORAL | 0 refills | Status: DC

## 2020-09-28 MED ORDER — ZOLPIDEM TARTRATE 5 MG PO TABS: 5.0000 mg | ORAL_TABLET | Freq: Every evening | ORAL | 0 refills | Status: DC | PRN

## 2020-09-28 MED ORDER — TRAZODONE HCL 150 MG PO TABS
150.0000 mg | ORAL_TABLET | Freq: Every day | ORAL | 0 refills | Status: DC
Start: 2020-09-28 — End: 2020-11-03

## 2020-09-28 MED ORDER — BUPROPION HCL ER (XL) 300 MG PO TB24: 300.0000 mg | ORAL_TABLET | ORAL | 0 refills | Status: DC

## 2020-09-28 MED ORDER — SERTRALINE HCL 100 MG PO TABS: 200.0000 mg | ORAL_TABLET | Freq: Every day | ORAL | 0 refills | Status: DC

## 2020-09-28 NOTE — Patient Instructions (Signed)
1.Continuesertraline200 mg daily 2.Continuebupropion 300 mg daily 3.Continueolanzapine 5 mg at night 4.ContinueAmbien 5 mg at night15 tabs per month due to insurance restriction 5.ContinueTrazodone150 mgat night as needed for sleep 6.  The care will be transferred to [facility]. Please contact them to make an appointment

## 2020-09-29 ENCOUNTER — Other Ambulatory Visit: Payer: Medicaid Other | Admitting: Adult Health

## 2020-10-04 ENCOUNTER — Ambulatory Visit (HOSPITAL_COMMUNITY): Payer: Medicaid Other | Admitting: Clinical

## 2020-10-12 ENCOUNTER — Other Ambulatory Visit: Payer: Self-pay

## 2020-10-12 ENCOUNTER — Telehealth (HOSPITAL_COMMUNITY): Payer: Self-pay | Admitting: Clinical

## 2020-10-12 ENCOUNTER — Ambulatory Visit (HOSPITAL_COMMUNITY): Payer: Medicaid Other | Admitting: Clinical

## 2020-10-12 NOTE — Telephone Encounter (Signed)
The patient did not respond to video link, phone call, or VM 

## 2020-10-27 ENCOUNTER — Other Ambulatory Visit: Payer: Self-pay

## 2020-10-27 ENCOUNTER — Ambulatory Visit (INDEPENDENT_AMBULATORY_CARE_PROVIDER_SITE_OTHER): Payer: Medicaid Other | Admitting: Clinical

## 2020-10-27 DIAGNOSIS — F333 Major depressive disorder, recurrent, severe with psychotic symptoms: Secondary | ICD-10-CM

## 2020-10-27 NOTE — Progress Notes (Signed)
Virtual Visit via Telephone Note   I connected with Michelle Page on 10/27/20 at 2:00 PM EST by telephone and verified that I am speaking with the correct person using two identifiers.   Location: Patient: Home Provider: Office   I discussed the limitations, risks, security and privacy concerns of performing an evaluation and management service by telephone and the availability of in person appointments. I also discussed with the patient that there may be a patient responsible charge related to this service. The patient expressed understanding and agreed to proceed.       THERAPIST PROGRESS NOTE   Session Time: 2:00PM-2:25PM   Participation Level: Active   Behavioral Response: DisheveledAlertDepressed   Type of Therapy: Individual Therapy   Treatment Goals addressed: Coping   Interventions: CBT and Motivational Interviewing   Summary: Michelle Page is a 45 y.o. female who presents with Schizoaffective Disorder Depressive type. The OPT therapist worked with the patient for her ongoing [place]. The OPT therapist utilized Motivational Interviewing to assist in creating therapeutic repore. The patient in the session was engaged and work in collaboration giving feedback about her triggers and symptoms over the past few weeks. The patient in this session spoke about having some problems around her medication therapy and switching to [facility] and difficulty figuring out transportation. The patient continues to have difficulty with sleep and hallucinations.The OPT therapist utilized Cognitive Behavioral Therapy through cognitive restructuring as well as worked with the patient on coping strategies to assist in management of symptoms.    Suicidal/Homicidal: Nowithout intent/plan   Therapist Response: The OPT therapist worked with the patient for the patients scheduled session. The patient was engaged in her session and gave feedback in relation to triggers, symptoms, and behavior responses  over the past few weeks. The OPT therapist worked with the patient utilizing an in session Cognitive Behavioral Therapy exercise. The patient was responsive in the session and verbalized, " I was referred to [facility] for my medication they have a walk in program". The patient indicated intent to go to [facility] and the OPT therapist urged her to do so before running out of her current prescription in relation to her current medication therapy. The patient noted difficulty with her internet and due to poor phone reception called from a road close to her home, however, due to the heat requested to cut her session short. The OPT therapist will continue treatment work with the patient in her next scheduled session.   Plan: Return again in 2/3 weeks.   Diagnosis:      Axis I: Major depressive disorder, recurrent, severe with psychotic features                           Axis II: No diagnosis   I discussed the assessment and treatment plan with the patient. The patient was provided an opportunity to ask questions and all were answered. The patient agreed with the plan and demonstrated an understanding of the instructions.   The patient was advised to call back or seek an in-person evaluation if the symptoms worsen or if the condition fails to improve as anticipated.   I provided 25 minutes of non-face-to-face time during this encounter.   Lennox Grumbles, LCSW   10/27/2020

## 2020-11-02 ENCOUNTER — Other Ambulatory Visit: Payer: Self-pay

## 2020-11-02 ENCOUNTER — Ambulatory Visit
Admission: EM | Admit: 2020-11-02 | Discharge: 2020-11-02 | Discharge status: Absent without leave | Payer: Medicaid Other

## 2020-11-03 ENCOUNTER — Ambulatory Visit: Payer: Medicaid Other

## 2020-11-03 ENCOUNTER — Other Ambulatory Visit: Payer: Self-pay | Admitting: Psychiatry

## 2020-11-03 ENCOUNTER — Telehealth: Payer: Self-pay

## 2020-11-03 DIAGNOSIS — G4700 Insomnia, unspecified: Secondary | ICD-10-CM

## 2020-11-03 MED ORDER — TRAZODONE HCL 150 MG PO TABS: 150.0000 mg | ORAL_TABLET | Freq: Every day | ORAL | 0 refills | Status: DC

## 2020-11-03 MED ORDER — SERTRALINE HCL 100 MG PO TABS: 200.0000 mg | ORAL_TABLET | Freq: Every day | ORAL | 0 refills | Status: DC

## 2020-11-03 MED ORDER — OLANZAPINE 5 MG PO TABS: 5.0000 mg | ORAL_TABLET | Freq: Every day | ORAL | 0 refills | Status: DC

## 2020-11-03 MED ORDER — ZOLPIDEM TARTRATE 5 MG PO TABS: 5.0000 mg | ORAL_TABLET | Freq: Every evening | ORAL | 2 refills | Status: DC | PRN

## 2020-11-03 MED ORDER — BUPROPION HCL ER (XL) 300 MG PO TB24: 300.0000 mg | ORAL_TABLET | ORAL | 0 refills | Status: DC

## 2020-11-03 NOTE — Telephone Encounter (Signed)
pt called asked if she can get a 90 day supply of all her medications until she can get in with another psychiatrist.

## 2020-11-03 NOTE — Telephone Encounter (Signed)
Ordered. Please inform her that I will not be able to do any more refills.

## 2020-11-16 ENCOUNTER — Other Ambulatory Visit: Payer: Self-pay

## 2020-11-16 ENCOUNTER — Ambulatory Visit (INDEPENDENT_AMBULATORY_CARE_PROVIDER_SITE_OTHER): Payer: Medicaid Other | Admitting: Clinical

## 2020-11-16 DIAGNOSIS — F333 Major depressive disorder, recurrent, severe with psychotic symptoms: Secondary | ICD-10-CM
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
## 2020-12-07 ENCOUNTER — Ambulatory Visit (INDEPENDENT_AMBULATORY_CARE_PROVIDER_SITE_OTHER): Payer: Medicaid Other | Admitting: Clinical

## 2020-12-07 ENCOUNTER — Other Ambulatory Visit: Payer: Self-pay

## 2020-12-07 DIAGNOSIS — F333 Major depressive disorder, recurrent, severe with psychotic symptoms: Secondary | ICD-10-CM
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
## 2020-12-13 ENCOUNTER — Emergency Department (HOSPITAL_COMMUNITY)
Admission: EM | Admit: 2020-12-13 | Discharge: 2020-12-14 | Disposition: A | Discharge status: Home / Self Care | Payer: Medicaid Other | Source: Home / Self Care | Attending: Emergency Medicine | Admitting: Emergency Medicine

## 2020-12-13 ENCOUNTER — Encounter (HOSPITAL_COMMUNITY): Payer: Self-pay | Admitting: Emergency Medicine

## 2020-12-13 ENCOUNTER — Emergency Department (HOSPITAL_COMMUNITY): Payer: Medicaid Other

## 2020-12-13 ENCOUNTER — Other Ambulatory Visit: Payer: Self-pay

## 2020-12-13 DIAGNOSIS — M542 Cervicalgia: Secondary | ICD-10-CM | POA: Insufficient documentation

## 2020-12-13 DIAGNOSIS — R0781 Pleurodynia: Secondary | ICD-10-CM | POA: Insufficient documentation

## 2020-12-13 DIAGNOSIS — Z87891 Personal history of nicotine dependence: Secondary | ICD-10-CM | POA: Insufficient documentation

## 2020-12-13 DIAGNOSIS — S0003XA Contusion of scalp, initial encounter: Secondary | ICD-10-CM | POA: Insufficient documentation

## 2020-12-13 DIAGNOSIS — Z79899 Other long term (current) drug therapy: Secondary | ICD-10-CM | POA: Insufficient documentation

## 2020-12-13 DIAGNOSIS — Z85828 Personal history of other malignant neoplasm of skin: Secondary | ICD-10-CM | POA: Insufficient documentation

## 2020-12-13 DIAGNOSIS — R109 Unspecified abdominal pain: Secondary | ICD-10-CM | POA: Insufficient documentation

## 2020-12-13 LAB — URINALYSIS, ROUTINE W REFLEX MICROSCOPIC
Bilirubin Urine: NEGATIVE
Glucose, UA: NEGATIVE mg/dL
Hgb urine dipstick: NEGATIVE
Ketones, ur: NEGATIVE mg/dL
Leukocytes,Ua: NEGATIVE
Nitrite: NEGATIVE
Protein, ur: NEGATIVE mg/dL
Specific Gravity, Urine: 1.008 (ref 1.005–1.030)
pH: 7 (ref 5.0–8.0)

## 2020-12-13 LAB — I-STAT CHEM 8, ED
BUN: 15 mg/dL (ref 6–20)
Calcium, Ion: 1.15 mmol/L (ref 1.15–1.40)
Chloride: 103 mmol/L (ref 98–111)
Creatinine, Ser: 0.7 mg/dL (ref 0.44–1.00)
Glucose, Bld: 84 mg/dL (ref 70–99)
HCT: 39 % (ref 36.0–46.0)
Hemoglobin: 13.3 g/dL (ref 12.0–15.0)
Potassium: 3.7 mmol/L (ref 3.5–5.1)
Sodium: 138 mmol/L (ref 135–145)
TCO2: 24 mmol/L (ref 22–32)

## 2020-12-13 LAB — POC URINE PREG, ED: Preg Test, Ur: NEGATIVE

## 2020-12-13 MED ORDER — IOHEXOL 350 MG/ML SOLN
100.0000 mL | Freq: Once | INTRAVENOUS | Status: AC | PRN
  Administered 2020-12-13: 100 mL via INTRAVENOUS

## 2020-12-13 MED ORDER — IBUPROFEN 800 MG PO TABS: 800.0000 mg | ORAL_TABLET | Freq: Three times a day (TID) | ORAL | 0 refills | Status: DC

## 2020-12-13 MED ORDER — FENTANYL CITRATE PF 50 MCG/ML IJ SOSY
50.0000 ug | PREFILLED_SYRINGE | Freq: Once | INTRAMUSCULAR | Status: AC
  Administered 2020-12-13: 50 ug via INTRAVENOUS
  Filled 2020-12-13: qty 1

## 2020-12-13 MED ORDER — HYDROCODONE-ACETAMINOPHEN 5-325 MG PO TABS: ORAL_TABLET | ORAL | 0 refills | Status: DC

## 2020-12-13 MED ORDER — HYDROCODONE-ACETAMINOPHEN 5-325 MG PO TABS
1.0000 | ORAL_TABLET | Freq: Once | ORAL | Status: AC
  Administered 2020-12-13: 1 via ORAL
  Filled 2020-12-13: qty 1

## 2020-12-13 MED ORDER — METHOCARBAMOL 500 MG PO TABS: 500.0000 mg | ORAL_TABLET | Freq: Three times a day (TID) | ORAL | 0 refills | Status: DC
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
## 2020-12-13 NOTE — Discharge Instructions (Signed)
Your work-up this evening was reassuring.  No evidence of any broken bones or internal organ injury.  I recommend that you apply ice packs on and off to help with bruising and swelling.  Avoid heavy lifting or straining.  Follow-up with your primary care provider for recheck.  Return the emergency department for any new or worsening symptoms.
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
## 2020-12-14 ENCOUNTER — Inpatient Hospital Stay (HOSPITAL_COMMUNITY)
Admission: RE | Admit: 2020-12-14 | Discharge: 2020-12-23 | DRG: 885 | Disposition: A | Discharge status: Home / Self Care | Payer: Medicaid Other | Attending: Emergency Medicine | Admitting: Emergency Medicine

## 2020-12-14 ENCOUNTER — Other Ambulatory Visit: Payer: Self-pay

## 2020-12-14 ENCOUNTER — Encounter (HOSPITAL_COMMUNITY): Payer: Self-pay | Admitting: Emergency Medicine

## 2020-12-14 ENCOUNTER — Telehealth (HOSPITAL_COMMUNITY): Payer: Self-pay | Admitting: *Deleted

## 2020-12-14 DIAGNOSIS — Z87891 Personal history of nicotine dependence: Secondary | ICD-10-CM

## 2020-12-14 DIAGNOSIS — Z79899 Other long term (current) drug therapy: Secondary | ICD-10-CM

## 2020-12-14 DIAGNOSIS — Z85820 Personal history of malignant melanoma of skin: Secondary | ICD-10-CM

## 2020-12-14 DIAGNOSIS — F4310 Post-traumatic stress disorder, unspecified: Secondary | ICD-10-CM | POA: Diagnosis present

## 2020-12-14 DIAGNOSIS — Z818 Family history of other mental and behavioral disorders: Secondary | ICD-10-CM

## 2020-12-14 DIAGNOSIS — R45851 Suicidal ideations: Secondary | ICD-10-CM | POA: Diagnosis present

## 2020-12-14 DIAGNOSIS — F411 Generalized anxiety disorder: Secondary | ICD-10-CM | POA: Diagnosis present

## 2020-12-14 DIAGNOSIS — G4700 Insomnia, unspecified: Secondary | ICD-10-CM | POA: Diagnosis present

## 2020-12-14 DIAGNOSIS — F1010 Alcohol abuse, uncomplicated: Secondary | ICD-10-CM | POA: Diagnosis present

## 2020-12-14 DIAGNOSIS — F1910 Other psychoactive substance abuse, uncomplicated: Secondary | ICD-10-CM | POA: Diagnosis present

## 2020-12-14 DIAGNOSIS — Z9049 Acquired absence of other specified parts of digestive tract: Secondary | ICD-10-CM | POA: Diagnosis not present

## 2020-12-14 DIAGNOSIS — F333 Major depressive disorder, recurrent, severe with psychotic symptoms: Principal | ICD-10-CM | POA: Diagnosis present

## 2020-12-14 DIAGNOSIS — Z20822 Contact with and (suspected) exposure to covid-19: Secondary | ICD-10-CM | POA: Diagnosis present

## 2020-12-14 DIAGNOSIS — F10139 Alcohol abuse with withdrawal, unspecified: Secondary | ICD-10-CM | POA: Diagnosis present

## 2020-12-14 DIAGNOSIS — F1420 Cocaine dependence, uncomplicated: Secondary | ICD-10-CM | POA: Diagnosis present

## 2020-12-14 DIAGNOSIS — E559 Vitamin D deficiency, unspecified: Secondary | ICD-10-CM | POA: Diagnosis present

## 2020-12-14 LAB — RESP PANEL BY RT-PCR (FLU A&B, COVID) ARPGX2
Influenza A by PCR: NEGATIVE
Influenza B by PCR: NEGATIVE
SARS Coronavirus 2 by RT PCR: NEGATIVE

## 2020-12-14 MED ORDER — SERTRALINE HCL 100 MG PO TABS
200.0000 mg | ORAL_TABLET | Freq: Every day | ORAL | Status: DC
  Administered 2020-12-15 – 2020-12-20 (×6): 200 mg via ORAL
  Filled 2020-12-14 (×10): qty 2

## 2020-12-14 MED ORDER — TRAZODONE HCL 150 MG PO TABS
150.0000 mg | ORAL_TABLET | Freq: Every day | ORAL | Status: DC
  Administered 2020-12-14 – 2020-12-20 (×7): 150 mg via ORAL
  Filled 2020-12-14 (×9): qty 1

## 2020-12-14 MED ORDER — IBUPROFEN 800 MG PO TABS
800.0000 mg | ORAL_TABLET | Freq: Three times a day (TID) | ORAL | Status: DC
  Administered 2020-12-14: 800 mg via ORAL
  Filled 2020-12-14 (×3): qty 1

## 2020-12-14 MED ORDER — ACETAMINOPHEN 325 MG PO TABS
650.0000 mg | ORAL_TABLET | Freq: Four times a day (QID) | ORAL | Status: DC | PRN
  Administered 2020-12-17 – 2020-12-23 (×3): 650 mg via ORAL
  Filled 2020-12-14 (×3): qty 2

## 2020-12-14 MED ORDER — NICOTINE 14 MG/24HR TD PT24
14.0000 mg | MEDICATED_PATCH | Freq: Every day | TRANSDERMAL | Status: DC
  Administered 2020-12-15 – 2020-12-16 (×2): 14 mg via TRANSDERMAL
  Filled 2020-12-14 (×12): qty 1

## 2020-12-14 MED ORDER — MAGNESIUM HYDROXIDE 400 MG/5ML PO SUSP
30.0000 mL | Freq: Every day | ORAL | Status: DC | PRN
Start: 2020-12-14 — End: 2020-12-23

## 2020-12-14 MED ORDER — HYDROXYZINE HCL 25 MG PO TABS
25.0000 mg | ORAL_TABLET | Freq: Three times a day (TID) | ORAL | Status: DC | PRN
  Administered 2020-12-14 – 2020-12-23 (×20): 25 mg via ORAL
  Filled 2020-12-14 (×22): qty 1

## 2020-12-14 MED ORDER — OLANZAPINE 5 MG PO TABS
5.0000 mg | ORAL_TABLET | Freq: Every day | ORAL | Status: DC
  Administered 2020-12-14: 5 mg via ORAL
  Filled 2020-12-14: qty 2
  Filled 2020-12-14 (×3): qty 1

## 2020-12-14 MED ORDER — METHOCARBAMOL 500 MG PO TABS
500.0000 mg | ORAL_TABLET | Freq: Three times a day (TID) | ORAL | Status: DC
  Administered 2020-12-14: 500 mg via ORAL
  Filled 2020-12-14 (×3): qty 1

## 2020-12-14 MED ORDER — BUPROPION HCL ER (XL) 300 MG PO TB24
300.0000 mg | ORAL_TABLET | ORAL | Status: DC
  Administered 2020-12-15 – 2020-12-20 (×6): 300 mg via ORAL
  Filled 2020-12-14 (×9): qty 1

## 2020-12-14 MED ORDER — ALUM & MAG HYDROXIDE-SIMETH 200-200-20 MG/5ML PO SUSP: 30.0000 mL | ORAL | Status: DC | PRN
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
## 2020-12-14 NOTE — Progress Notes (Signed)
Patient ID: Michelle Page, female   DOB: 1975/12/31, 45 y.o.   MRN: [id number] Patient accepted to [facility] 304-1 pending negative covid test. Swab sent to [facility] lab stat with staff. Awaiting covid result prior to admission to the adult unit.
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
## 2020-12-15 ENCOUNTER — Encounter (HOSPITAL_COMMUNITY): Payer: Self-pay | Admitting: Family Medicine

## 2020-12-15 DIAGNOSIS — F1010 Alcohol abuse, uncomplicated: Secondary | ICD-10-CM | POA: Diagnosis present

## 2020-12-15 DIAGNOSIS — F1420 Cocaine dependence, uncomplicated: Secondary | ICD-10-CM

## 2020-12-15 DIAGNOSIS — F4310 Post-traumatic stress disorder, unspecified: Secondary | ICD-10-CM | POA: Diagnosis not present

## 2020-12-15 DIAGNOSIS — F1910 Other psychoactive substance abuse, uncomplicated: Secondary | ICD-10-CM | POA: Diagnosis present

## 2020-12-15 HISTORY — DX: Cocaine dependence, uncomplicated: F14.20

## 2020-12-15 LAB — COMPREHENSIVE METABOLIC PANEL
ALT: 23 U/L (ref 0–44)
AST: 25 U/L (ref 15–41)
Albumin: 3.8 g/dL (ref 3.5–5.0)
Alkaline Phosphatase: 66 U/L (ref 38–126)
Anion gap: 5 (ref 5–15)
BUN: 11 mg/dL (ref 6–20)
CO2: 26 mmol/L (ref 22–32)
Calcium: 8.8 mg/dL — ABNORMAL LOW (ref 8.9–10.3)
Chloride: 106 mmol/L (ref 98–111)
Creatinine, Ser: 0.72 mg/dL (ref 0.44–1.00)
GFR, Estimated: >60 mL/min (ref >60)
Glucose, Bld: 93 mg/dL (ref 70–99)
Potassium: 3.9 mmol/L (ref 3.5–5.1)
Sodium: 137 mmol/L (ref 135–145)
Total Bilirubin: 0.6 mg/dL (ref 0.3–1.2)
Total Protein: 6.7 g/dL (ref 6.5–8.1)

## 2020-12-15 LAB — LIPID PANEL
Cholesterol: 170 mg/dL (ref 0–200)
HDL: 53 mg/dL (ref >40)
LDL Cholesterol: 104 mg/dL — ABNORMAL HIGH (ref 0–99)
Total CHOL/HDL Ratio: 3.2 RATIO
Triglycerides: 64 mg/dL (ref <150)
VLDL: 13 mg/dL (ref 0–40)

## 2020-12-15 LAB — CBC
HCT: 37.4 % (ref 36.0–46.0)
Hemoglobin: 12.3 g/dL (ref 12.0–15.0)
MCH: 30.6 pg (ref 26.0–34.0)
MCHC: 32.9 g/dL (ref 30.0–36.0)
MCV: 93 fL (ref 80.0–100.0)
Platelets: 142 10*3/uL — ABNORMAL LOW (ref 150–400)
RBC: 4.02 MIL/uL (ref 3.87–5.11)
RDW: 13.3 % (ref 11.5–15.5)
WBC: 6 10*3/uL (ref 4.0–10.5)
nRBC: 0 % (ref 0.0–0.2)

## 2020-12-15 LAB — HEMOGLOBIN A1C
Hgb A1c MFr Bld: 5.4 % (ref 4.8–5.6)
Mean Plasma Glucose: 108.28 mg/dL

## 2020-12-15 LAB — RAPID URINE DRUG SCREEN, HOSP PERFORMED
Amphetamines: NOT DETECTED
Barbiturates: NOT DETECTED
Benzodiazepines: NOT DETECTED
Cocaine: NOT DETECTED
Opiates: POSITIVE — AB
Tetrahydrocannabinol: NOT DETECTED

## 2020-12-15 LAB — PREGNANCY, URINE: Preg Test, Ur: NEGATIVE

## 2020-12-15 LAB — TSH: TSH: 3.063 u[IU]/mL (ref 0.350–4.500)

## 2020-12-15 LAB — GLUCOSE, CAPILLARY: Glucose-Capillary: 91 mg/dL (ref 70–99)

## 2020-12-15 MED ORDER — THIAMINE HCL 100 MG PO TABS
100.0000 mg | ORAL_TABLET | Freq: Every day | ORAL | Status: DC
  Administered 2020-12-16 – 2020-12-23 (×8): 100 mg via ORAL
  Filled 2020-12-15 (×10): qty 1

## 2020-12-15 MED ORDER — METHOCARBAMOL 500 MG PO TABS
500.0000 mg | ORAL_TABLET | Freq: Three times a day (TID) | ORAL | Status: DC
  Administered 2020-12-15 – 2020-12-19 (×14): 500 mg via ORAL
  Filled 2020-12-15 (×20): qty 1

## 2020-12-15 MED ORDER — OLANZAPINE 10 MG PO TABS
10.0000 mg | ORAL_TABLET | Freq: Every day | ORAL | Status: DC
  Administered 2020-12-15 – 2020-12-19 (×5): 10 mg via ORAL
  Filled 2020-12-15 (×7): qty 1

## 2020-12-15 MED ORDER — ONDANSETRON 4 MG PO TBDP: 4.0000 mg | ORAL_TABLET | Freq: Four times a day (QID) | ORAL | Status: AC | PRN

## 2020-12-15 MED ORDER — IBUPROFEN 800 MG PO TABS
800.0000 mg | ORAL_TABLET | Freq: Three times a day (TID) | ORAL | Status: AC
  Administered 2020-12-15 – 2020-12-19 (×10): 800 mg via ORAL
  Filled 2020-12-15 (×20): qty 1

## 2020-12-15 MED ORDER — LOPERAMIDE HCL 2 MG PO CAPS: 2.0000 mg | ORAL_CAPSULE | ORAL | Status: AC | PRN

## 2020-12-15 MED ORDER — ADULT MULTIVITAMIN W/MINERALS CH
1.0000 | ORAL_TABLET | Freq: Every day | ORAL | Status: DC
  Administered 2020-12-15 – 2020-12-23 (×9): 1 via ORAL
  Filled 2020-12-15 (×11): qty 1

## 2020-12-15 MED ORDER — LORAZEPAM 1 MG PO TABS: 1.0000 mg | ORAL_TABLET | Freq: Four times a day (QID) | ORAL | Status: AC | PRN

## 2020-12-15 NOTE — Progress Notes (Signed)
[place] Group Notes:  (Nursing/MHT/Case Management/Adjunct)  Date:  12/15/2020  Time:  2015  Type of Therapy:   wrap up group  Participation Level:  Minimal  Participation Quality:  Supportive  Affect:  Anxious  Cognitive:  Alert  Insight:  Improving  Engagement in Group:  Resistant  Modes of Intervention:  Clarification, Education, and Support  Summary of Progress/Problems: Positive thinking and self-care were discussed. Pt requested not to participate by sharing, only wanted to listen. Pt did report it is a good thing she is here in the hospital.   Shellia Cleverly 12/15/2020, 9:00 PM
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
## 2020-12-15 NOTE — H&P (Signed)
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 management  Observation Level/Precautions:  Detox 15 minute checks  Laboratory:  CBC Chemistry Profile HbAIC HCG UDS UA Lipid panel, TSH Available lab results reviewed.  CMP showed calcium of 8.8 and otherwise WNL.  Lipid profile showed LDL of 104 and otherwise WNL.  CBC showed platelets of 142 and otherwise WNL.  Differential was not performed.  Hemoglobin A1c was 5.4.  Urine pregnancy test was negative.  TSH was 3.063.  Influenza A, influenza B and coronavirus testing were negative.  Urinalysis was  WNL.  Urine tox screen was positive for opiates (patient prescribed Vicodin at recent ED visit).    EKG showed normal sinus rhythm with old anterior infarct age undetermined, ventricular rate of 80 and QT/QTc of 390/449.    Psychotherapy:    Medications:  Continue Wellbutrin XL 300 mg daily, Zoloft 200 mg daily, trazodone 150 mg at bedtime and increase Zyprexa to 10 mg at bedtime for symptoms of depression, anxiety and hallucinations.  Atarax 25 mg 3 times daily PRN anxiety.  Patient has been  started on CIWA protocol with comfort meds and PRN lorazepam to cover for any possible withdrawal from alcohol.  She does not appear to display any signs or symptoms of alcohol withdrawal currently.  See MAR.   Consultations:    Discharge Concerns:    Estimated LOS: 5 to 7 days  Other:   Patient would benefit from participation in substance abuse treatment program after discharge.  She would benefit from participation in [place] program and therapy to address PTSD in the future once she has established adequate coping skills.  We will need to clarify whether she is still connected with her most recent outpatient psychiatrist as there is conflicting information about this relationship in chart notes.   Physician Treatment Plan for Primary Diagnosis: Post traumatic stress disorder Long Term Goal(s): Improvement in symptoms so as ready for discharge  Short Term Goals: Ability to identify changes in lifestyle to reduce recurrence of condition will improve, Ability to verbalize feelings will improve, Ability to disclose and discuss suicidal ideas, Ability to demonstrate self-control will improve, Ability to identify and develop effective coping behaviors will improve, Ability to maintain clinical measurements within normal limits will improve, Compliance with prescribed medications will improve, and Ability to identify triggers associated with substance abuse/mental health issues will improve   Physician Treatment Plan  for Secondary Diagnosis: Principal Problem:   Post traumatic stress disorder Active Problems:   MDD (major depressive disorder), recurrent, severe, with psychosis ([place])   Moderate cocaine use disorder ([place])   Substance abuse ([place])   Alcohol abuse  Long Term Goal(s): Improvement in symptoms so as ready for discharge  Short Term Goals: Ability to identify changes in lifestyle to reduce recurrence of condition will improve, Ability to verbalize feelings will improve, Ability to disclose and discuss suicidal ideas, Ability to demonstrate self-control will improve, Ability to identify and develop effective coping behaviors will improve, Ability to maintain clinical measurements within normal limits will improve, Compliance with prescribed medications will improve, and Ability to identify triggers associated with substance abuse/mental health issues will improve  I certify that inpatient services furnished can reasonably be expected to improve the patient's condition.    Arthor Captain, MD 8/18/20222:13 PM

## 2020-12-15 NOTE — [facility] Group Notes (Signed)
Psychoeducational Group- Identifying Negative Behavioral Patterns. In this group, Probation officer read the Poem ''There's a hole in the sidewalk'' By [employer]. Each Person was asked to identify how this applies to their own mental health. Pt was able to identify triggers which lead to hospitalization, and identify negative patterns in which pt engages in. Pt was attentative and appropriate.

## 2020-12-15 NOTE — [facility] Group Notes (Signed)
Adult Psychoeducational Group Note  Date:  12/15/2020 Time:  11:50 AM  Group Topic/Focus:  Goals Group:   The focus of this group is to help patients establish daily goals to achieve during treatment and discuss how the patient can incorporate goal setting into their daily lives to aide in recovery.  Participation Level:  Minimal  Participation Quality:  Resistant  Affect:  Irritable  Cognitive:  Appropriate  Insight: Lacking  Engagement in Group:  Lacking  Modes of Intervention:  Orientation  Additional Comments:  Goal is to "make it through the day"  Chase Picket 12/15/2020, 11:50 AM

## 2020-12-15 NOTE — Progress Notes (Signed)
  Pt presents with high anxiety and pain in multiple areas, including her head and neck (8/10).  Administered PRN Vistaril per MAR per pt request.  Administered 800 mg Ibuprofen per MAR scheduled dose.  Pt given specimen cup for urine sample.  Pt remains safe on unit with Q 15 minute safety checks.
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
## 2020-12-16 DIAGNOSIS — F4310 Post-traumatic stress disorder, unspecified: Secondary | ICD-10-CM | POA: Diagnosis not present

## 2020-12-16 MED ORDER — ENSURE ENLIVE PO LIQD
237.0000 mL | ORAL | Status: DC
  Administered 2020-12-16 – 2020-12-20 (×4): 237 mL via ORAL
  Filled 2020-12-16 (×5): qty 237

## 2020-12-16 NOTE — Progress Notes (Signed)
Patient compliant with treatment and medications no signs of symptoms of adverse reactions noted. Endorses Passive SI and denies HI/A/VH and verbally contracted for safety. Support and encouragement provided as needed.    12/16/20 0300  Psych Admission Type (Psych Patients Only)  Admission Status Voluntary  Psychosocial Assessment  Patient Complaints Anxiety  Eye Contact Brief  Facial Expression Anxious;Worried  Affect Appropriate to circumstance  Speech Unremarkable  Interaction Cautious  Motor Activity Fidgety;Restless  Appearance/Hygiene Poor hygiene  Behavior Characteristics Cooperative;Anxious  Mood Anxious  Thought Process  Coherency WDL  Content Blaming self;Blaming others  Delusions Paranoid  Perception Hallucinations  Hallucination Auditory;Visual  Judgment Limited  Confusion WDL  Danger to Self  Current suicidal ideation? Passive  Self-Injurious Behavior No self-injurious ideation or behavior indicators observed or expressed   Agreement Not to Harm Self Yes  Description of Agreement verbal  Danger to Others  Danger to Others None reported or observed

## 2020-12-16 NOTE — [facility] Group Notes (Signed)
Type of Therapy and Topic: Group Therapy: Anger Management   Participation Level:  Did Not Attend  Description of Group: In this group, patients will learn helpful strategies and techniques to manage anger, express anger in alternative ways, change hostile attitudes, and prevent aggressive acts, such as verbal abuse and violence.This group will be process-oriented and eductional, with patients participating in exploration of their own experiences as well as giving and receiving support and challenge from other group members.  Therapeutic Goals: Patient will learn to manage anger. Patient will learn to stop violence or the threat of violence. Patient will learn to develop self control over thoughts and actions. Patient will receive support and feedback from others    Therapeutic Modalities: Cognitive Behavioral Therapy Solution Focused Therapy Motivational Interviewing  Toney Reil, [place] Social Worker [employer]

## 2020-12-16 NOTE — Plan of Care (Signed)
Nurse discussed coping skills with patient.  
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
## 2020-12-16 NOTE — Progress Notes (Signed)
Recreation Therapy Notes  Date: 8.19.22 Time: 0930 Location: [address] Dayroom  Group Topic: Stress Management   Goal Area(s) Addresses:  Patient will actively participate in stress management techniques presented during session.  Patient will successfully identify benefit of practicing stress management post d/c.   Behavioral Response: Appropriate  Intervention: Relaxation exercise with ambient sound and script   Activity: Guided Imagery. LRT provided education, instruction, and demonstration on practice of visualization via guided imagery. Patient was asked to participate in the technique introduced during session. Patients were given suggestions of ways to access scripts post d/c and encouraged to explore Youtube and other apps available on smartphones, tablets, and computers.  Education:  Stress Management, Discharge Planning.   Education Outcome: Acknowledges education  Clinical Observations/Feedback: Patient actively engaged in technique introduced and expressed no concerns.      Victorino Sparrow, LRT/CTRS         Victorino Sparrow A 12/16/2020 12:28 PM

## 2020-12-16 NOTE — Progress Notes (Signed)
NUTRITION ASSESSMENT  Pt identified as at risk on the Malnutrition Screen Tool  INTERVENTION: 1. Supplements: Ensure Plus PO daily, each provides 350 kcals and 13g protein   NUTRITION DIAGNOSIS: Unintentional weight loss related to sub-optimal intake as evidenced by pt report.   Goal: Pt to meet >/= 90% of their estimated nutrition needs.  Monitor:  PO intake  Assessment:  Pt admitted for depression, PTSD and polysubstance abuse (cocaine, THC, EtOH).  UDS+ opiates.  Per weight records, no recent weight loss noted. Will order Ensure supplements daily given poor appetite.   Height: Ht [place] from Last 1 Encounters:  12/14/20 '5\' 4"'$  (1.626 m)    Weight: Wt [place] from Last 1 Encounters:  12/14/20 95.3 kg    Weight Hx: Wt [place]ings from Last 10 Encounters:  12/14/20 95.3 kg  12/13/20 95 kg  07/21/20 88.5 kg  06/09/20 88.5 kg  01/27/19 87.6 kg  11/26/18 87.1 kg  02/27/18 88.9 kg  01/22/18 97.3 kg  01/16/18 97.3 kg  01/07/18 98 kg    BMI:  Body mass index is 36.05 kg/m. Pt meets criteria for obesity based on current BMI.  Estimated Nutritional Needs: Kcal: 25-30 kcal/kg Protein: > 1 gram protein/kg Fluid: 1 ml/kcal  Diet Order:  Diet Order             Diet regular Room service appropriate? Yes; Fluid consistency: Thin  Diet effective now                  Pt is also offered choice of unit snacks mid-morning and mid-afternoon.  Pt is eating as desired.   Lab results and medications reviewed.   Clayton Bibles, MS, RD, LDN Inpatient Clinical Dietitian Contact information available via Amion

## 2020-12-16 NOTE — Tx Team (Signed)
Interdisciplinary Treatment and Diagnostic Plan Update  12/16/2020 Time of Session: 11:15a Michelle Page MRN: [id number]  Principal Diagnosis: Post traumatic stress disorder  Secondary Diagnoses: Principal Problem:   Post traumatic stress disorder Active Problems:   MDD (major depressive disorder), recurrent, severe, with psychosis ([place])   Moderate cocaine use disorder ([place])   Substance abuse ([place])   Alcohol abuse   Current Medications:  Current Facility-Administered Medications  Medication Dose Route Frequency Provider Last Rate Last Admin   acetaminophen (TYLENOL) tablet 650 mg  650 mg Oral Q6H PRN Prescilla Sours, PA-C       alum & mag hydroxide-simeth (MAALOX/MYLANTA) 200-200-20 MG/5ML suspension 30 mL  30 mL Oral Q4H PRN Margorie John W, PA-C       buPROPion (WELLBUTRIN XL) 24 hr tablet 300 mg  300 mg Oral BH-q7a Taylor, Cody W, PA-C   300 mg at 12/16/20 0759   feeding supplement (ENSURE ENLIVE / ENSURE PLUS) liquid 237 mL  237 mL Oral Q24H Nelda Marseille, Amy E, MD       hydrOXYzine (ATARAX/VISTARIL) tablet 25 mg  25 mg Oral TID PRN Prescilla Sours, PA-C   25 mg at 12/16/20 0804   ibuprofen (ADVIL) tablet 800 mg  800 mg Oral TID Prescilla Sours, PA-C   800 mg at 12/16/20 0759   loperamide (IMODIUM) capsule 2-4 mg  2-4 mg Oral PRN Arthor Captain, MD       LORazepam (ATIVAN) tablet 1 mg  1 mg Oral Q6H PRN Arthor Captain, MD       magnesium hydroxide (MILK OF MAGNESIA) suspension 30 mL  30 mL Oral Daily PRN Margorie John W, PA-C       methocarbamol (ROBAXIN) tablet 500 mg  500 mg Oral TID Margorie John W, PA-C   500 mg at 12/16/20 0759   multivitamin with minerals tablet 1 tablet  1 tablet Oral Daily Arthor Captain, MD   1 tablet at 12/16/20 0759   nicotine (NICODERM CQ - dosed in mg/24 hours) patch 14 mg  14 mg Transdermal Daily Margorie John W, PA-C   14 mg at 12/16/20 0800   OLANZapine (ZYPREXA) tablet 10 mg  10 mg Oral QHS Arthor Captain, MD   10 mg at 12/15/20 2053   ondansetron  (ZOFRAN-ODT) disintegrating tablet 4 mg  4 mg Oral Q6H PRN Arthor Captain, MD       sertraline (ZOLOFT) tablet 200 mg  200 mg Oral Daily Margorie John W, PA-C   200 mg at 12/16/20 0800   thiamine tablet 100 mg  100 mg Oral Daily Arthor Captain, MD   100 mg at 12/16/20 0800   traZODone (DESYREL) tablet 150 mg  150 mg Oral QHS Margorie John W, PA-C   150 mg at 12/15/20 2054   PTA Medications: Medications Prior to Admission  Medication Sig Dispense Refill Last Dose   buPROPion (WELLBUTRIN XL) 300 MG 24 hr tablet Take 1 tablet (300 mg total) by mouth every morning. 90 tablet 0 12/14/2020   HYDROcodone-acetaminophen (NORCO/VICODIN) 5-325 MG tablet Take one tab po q 4 hrs prn pain (Patient taking differently: Take 1 tablet by mouth every 4 (four) hours as needed for moderate pain.) 10 tablet 0 12/14/2020   ibuprofen (ADVIL) 800 MG tablet Take 1 tablet (800 mg total) by mouth 3 (three) times daily. Take with food 21 tablet 0 12/14/2020   methocarbamol (ROBAXIN) 500 MG tablet Take 1 tablet (500 mg total) by mouth 3 (  three) times daily. 21 tablet 0 12/14/2020   OLANZapine (ZYPREXA) 5 MG tablet Take 1 tablet (5 mg total) by mouth at bedtime. 90 tablet 0 12/14/2020   sertraline (ZOLOFT) 100 MG tablet Take 2 tablets (200 mg total) by mouth daily. 180 tablet 0 12/14/2020   traZODone (DESYREL) 150 MG tablet Take 1 tablet (150 mg total) by mouth at bedtime. 90 tablet 0 12/14/2020   zolpidem (AMBIEN) 5 MG tablet Take 1 tablet (5 mg total) by mouth at bedtime as needed for sleep. (Patient taking differently: Take 5 mg by mouth at bedtime.) 15 tablet 2 12/13/2020    Patient Stressors:    Patient Strengths:    Treatment Modalities: Medication Management, Group therapy, Case management,  1 to 1 session with clinician, Psychoeducation, Recreational therapy.   Physician Treatment Plan for Primary Diagnosis: Post traumatic stress disorder Long Term Goal(s): Improvement in symptoms so as ready for discharge   Short  Term Goals: Ability to identify changes in lifestyle to reduce recurrence of condition will improve Ability to verbalize feelings will improve Ability to disclose and discuss suicidal ideas Ability to demonstrate self-control will improve Ability to identify and develop effective coping behaviors will improve Ability to maintain clinical measurements within normal limits will improve Compliance with prescribed medications will improve Ability to identify triggers associated with substance abuse/mental health issues will improve  Medication Management: Evaluate patient's response, side effects, and tolerance of medication regimen.  Therapeutic Interventions: 1 to 1 sessions, Unit Group sessions and Medication administration.  Evaluation of Outcomes: Not Met  Physician Treatment Plan for Secondary Diagnosis: Principal Problem:   Post traumatic stress disorder Active Problems:   MDD (major depressive disorder), recurrent, severe, with psychosis ([place])   Moderate cocaine use disorder ([place])   Substance abuse ([place])   Alcohol abuse  Long Term Goal(s): Improvement in symptoms so as ready for discharge   Short Term Goals: Ability to identify changes in lifestyle to reduce recurrence of condition will improve Ability to verbalize feelings will improve Ability to disclose and discuss suicidal ideas Ability to demonstrate self-control will improve Ability to identify and develop effective coping behaviors will improve Ability to maintain clinical measurements within normal limits will improve Compliance with prescribed medications will improve Ability to identify triggers associated with substance abuse/mental health issues will improve     Medication Management: Evaluate patient's response, side effects, and tolerance of medication regimen.  Therapeutic Interventions: 1 to 1 sessions, Unit Group sessions and Medication administration.  Evaluation of Outcomes: Not Met   RN Treatment Plan  for Primary Diagnosis: Post traumatic stress disorder Long Term Goal(s): Knowledge of disease and therapeutic regimen to maintain health will improve  Short Term Goals: Ability to participate in decision making will improve, Ability to verbalize feelings will improve, and Ability to identify and develop effective coping behaviors will improve  Medication Management: RN will administer medications as ordered by provider, will assess and evaluate patient's response and provide education to patient for prescribed medication. RN will report any adverse and/or side effects to prescribing provider.  Therapeutic Interventions: 1 on 1 counseling sessions, Psychoeducation, Medication administration, Evaluate responses to treatment, Monitor vital signs and CBGs as ordered, Perform/monitor CIWA, COWS, AIMS and Fall Risk screenings as ordered, Perform wound care treatments as ordered.  Evaluation of Outcomes: Not Met   LCSW Treatment Plan for Primary Diagnosis: Post traumatic stress disorder Long Term Goal(s): Safe transition to appropriate next level of care at discharge, Engage patient in therapeutic group addressing interpersonal  concerns.  Short Term Goals: Engage patient in aftercare planning with referrals and resources, Increase ability to appropriately verbalize feelings, and Increase emotional regulation  Therapeutic Interventions: Assess for all discharge needs, 1 to 1 time with Social worker, Explore available resources and support systems, Assess for adequacy in community support network, Educate family and significant other(s) on suicide prevention, Complete Psychosocial Assessment, Interpersonal group therapy.  Evaluation of Outcomes: Not Met   Progress in Treatment: Attending groups: Yes. Participating in groups: Yes. Taking medication as prescribed: Yes. Toleration medication: Yes. Family/Significant other contact made: Yes, individual(s) contacted:  friend Patient understands  diagnosis: Yes. Discussing patient identified problems/goals with staff: Yes. Medical problems stabilized or resolved: Yes. Denies suicidal/homicidal ideation: No. Issues/concerns per patient self-inventory: No. Other: None  New problem(s) identified: No, Describe:  None  New Short Term/Long Term Goal(s):medication stabilization, elimination of SI thoughts, development of comprehensive mental wellness plan.   Patient Goals:  "get stable."  Discharge Plan or Barriers: Patient recently admitted. CSW will continue to follow and assess for appropriate referrals and possible discharge planning.   Reason for Continuation of Hospitalization: Medication stabilization Suicidal ideation Withdrawal symptoms  Estimated Length of Stay: 3-5 days  Attendees: Patient: Michelle Page 12/16/2020 11:39 AM  Physician: Dr. Kai Levins 12/16/2020 11:39 AM  Nursing:  12/16/2020 11:39 AM  RN Care Manager: 12/16/2020 11:39 AM  Social Worker: Toney Reil, [place] 12/16/2020 11:39 AM  Recreational Therapist:  12/16/2020 11:39 AM  Other:  12/16/2020 11:39 AM  Other:  12/16/2020 11:39 AM  Other: 12/16/2020 11:39 AM    Scribe for Treatment Team: Mliss Fritz, [place] 12/16/2020 11:39 AM
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
## 2020-12-17 DIAGNOSIS — F4310 Post-traumatic stress disorder, unspecified: Secondary | ICD-10-CM | POA: Diagnosis not present

## 2020-12-17 MED ORDER — PRAZOSIN HCL 1 MG PO CAPS
1.0000 mg | ORAL_CAPSULE | Freq: Every day | ORAL | Status: DC
  Administered 2020-12-17 – 2020-12-22 (×6): 1 mg via ORAL
  Filled 2020-12-17 (×9): qty 1

## 2020-12-17 MED ORDER — HYDROXYZINE HCL 25 MG PO TABS
25.0000 mg | ORAL_TABLET | Freq: Once | ORAL | Status: AC
  Administered 2020-12-17: 25 mg via ORAL
  Filled 2020-12-17 (×2): qty 1

## 2020-12-17 NOTE — [facility] Group Notes (Signed)
[facility] Group Notes:  (Nursing/MHT/Case Management/Adjunct)  Date: 12/16/2020  Time:  8:00 PM  Type of Therapy:  Group Therapy  Participation Level:  Active  Participation Quality:  Attentive  Affect:  Appropriate  Cognitive:  Appropriate  Insight:  Appropriate  Engagement in Group:  Developing/Improving  Modes of Intervention:  Discussion  Summary of Progress/Problems:  Michelle Page  12/16/2020, 8:00 AM

## 2020-12-17 NOTE — Progress Notes (Signed)
Pt continues to be cooperative. Pt complied with scheduled medications. Pt currently denies SI/ HI/AVH and pain to this writer during the shift. Pt interacts appropriately with others in the milieu. Pt verbally contracted for safety.   A: Support and encouragement was offered and accepted. Pt medications were administered per order. Safety rounds continued and maintained Q 15 throughout the shift.   R: Safety maintained. Will continue to monitor and assess.     12/17/20 1557  Psych Admission Type (Psych Patients Only)  Admission Status Voluntary  Psychosocial Assessment  Patient Complaints Anxiety  Eye Contact Brief  Facial Expression Anxious;Worried  Affect Appropriate to circumstance  Speech Unremarkable  Interaction Cautious  Motor Activity Fidgety;Restless  Appearance/Hygiene Poor hygiene  Behavior Characteristics Appropriate to situation  Mood Depressed  Thought Process  Coherency WDL  Content Blaming self;Blaming others  Delusions Paranoid  Perception Hallucinations  Hallucination Auditory;Visual  Judgment Limited  Confusion WDL  Danger to Self  Current suicidal ideation? Passive  Self-Injurious Behavior No self-injurious ideation or behavior indicators observed or expressed   Agreement Not to Harm Self Yes  Description of Agreement verbal  Danger to Others  Danger to Others None reported or observed

## 2020-12-17 NOTE — Progress Notes (Signed)
   12/17/20 2100  Psych Admission Type (Psych Patients Only)  Admission Status Voluntary  Psychosocial Assessment  Patient Complaints Other (Comment) (pt c/o insomnia)  Eye Contact Brief  Facial Expression Anxious;Worried  Affect Appropriate to circumstance  Speech Unremarkable  Interaction Cautious  Motor Activity Fidgety;Restless  Appearance/Hygiene Poor hygiene  Behavior Characteristics Anxious  Mood Anxious;Preoccupied;Pleasant;Sad  Thought Process  Coherency WDL  Content Blaming self;Blaming others  Delusions Paranoid  Perception Hallucinations  Hallucination Auditory;Visual  Judgment Limited  Confusion WDL  Danger to Self  Current suicidal ideation? Passive  Self-Injurious Behavior No self-injurious ideation or behavior indicators observed or expressed   Agreement Not to Harm Self Yes  Description of Agreement verbal  Danger to Others  Danger to Others None reported or observed
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
## 2020-12-17 NOTE — Progress Notes (Signed)
Pt complained of a swelling part of her head, pt stated she might have bumped her head on something, but not when and where. Pt offered cold park and Tylenol. Pt denies SI/HI and contracted for safety.    12/17/20 0359  Psych Admission Type (Psych Patients Only)  Admission Status Voluntary  Psychosocial Assessment  Patient Complaints Anxiety  Eye Contact Brief  Facial Expression Anxious;Worried  Affect Appropriate to circumstance  Speech Unremarkable  Interaction Cautious  Motor Activity Fidgety;Restless  Appearance/Hygiene Poor hygiene  Behavior Characteristics Appropriate to situation  Mood Depressed  Thought Process  Coherency WDL  Content Blaming self;Blaming others  Delusions Paranoid  Perception Hallucinations  Hallucination Auditory;Visual  Judgment Limited  Confusion WDL  Danger to Self  Current suicidal ideation? Denies  Self-Injurious Behavior No self-injurious ideation or behavior indicators observed or expressed   Agreement Not to Harm Self Yes  Description of Agreement verbal  Danger to Others  Danger to Others None reported or observed

## 2020-12-17 NOTE — Progress Notes (Signed)
Adult Psychoeducational Group Note  Date:  12/17/2020 Time:  9:22 AM  Group Topic/Focus:  Goals Group:   The focus of this group is to help patients establish daily goals to achieve during treatment and discuss how the patient can incorporate goal setting into their daily lives to aide in recovery.  Participation Level:  Active  Participation Quality:  Appropriate  Affect:  Appropriate  Cognitive:  Alert  Insight: Appropriate  Engagement in Group:  Engaged  Modes of Intervention:  Discussion  Additional Comments:  Pt attended group and participated in discussion.  Amit Leece R Deon Ivey 12/17/2020, 9:22 AM

## 2020-12-17 NOTE — [facility] Group Notes (Signed)
Postives, kids are her motivation to get better

## 2020-12-17 NOTE — Progress Notes (Signed)
[facility] Group Notes:  (Nursing/MHT/Case Management/Adjunct)  Date:  12/17/2020  Time:  2000 Type of Therapy:   wrap up group  Participation Level:  Active  Participation Quality:  Appropriate, Sharing, and Supportive  Affect:  Anxious  Cognitive:  Alert  Insight:  Improving  Engagement in Group:  Engaged  Modes of Intervention:  Clarification, Education, and Support  Summary of Progress/Problems: Positive thinking and positive change were discussed.   Michelle Page 12/17/2020, 8:59 PM

## 2020-12-18 DIAGNOSIS — F4310 Post-traumatic stress disorder, unspecified: Secondary | ICD-10-CM | POA: Diagnosis not present

## 2020-12-18 NOTE — Progress Notes (Signed)
[facility] Group Notes:  (Nursing/MHT/Case Management/Adjunct)  Date:  12/18/2020  Time:  2000  Type of Therapy:   wrap up group  Participation Level:  Active  Participation Quality:  Appropriate, Attentive, Sharing, and Supportive  Affect:  Anxious and Excited  Cognitive:  Alert  Insight:  Improving  Engagement in Group:  Engaged  Modes of Intervention:  Clarification, Education, and Support  Summary of Progress/Problems:  Positive thinking and self-care were discussed.  Shellia Cleverly 12/18/2020, 11:15 PM
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
## 2020-12-18 NOTE — [facility] Group Notes (Signed)
Adult Psychoeducational Group Note  Date:  12/18/2020 Time:  9:32 AM  Group Topic/Focus:  Goals Group:   The focus of this group is to help patients establish daily goals to achieve during treatment and discuss how the patient can incorporate goal setting into their daily lives to aide in recovery.  Participation Level:  Active  Participation Quality:  Appropriate and Attentive  Affect:  Appropriate  Cognitive:  Alert and Appropriate  Insight: Appropriate  Engagement in Group:  Engaged  Modes of Intervention:  Orientation  Additional Comments: Goal is to get meds that will help her sleep  Chase Picket 12/18/2020, 9:32 AM

## 2020-12-18 NOTE — Progress Notes (Signed)
   12/18/20 2304  Psych Admission Type (Psych Patients Only)  Admission Status Voluntary  Psychosocial Assessment  Patient Complaints Anxiety;Sleep disturbance  Eye Contact Brief  Facial Expression Anxious;Worried  Affect Appropriate to circumstance  Speech Unremarkable  Interaction Cautious  Motor Activity Fidgety;Restless  Appearance/Hygiene Poor hygiene  Behavior Characteristics Cooperative;Appropriate to situation;Anxious  Mood Anxious;Preoccupied;Pleasant  Thought Process  Coherency WDL  Content Blaming self;Blaming others  Delusions Paranoid  Perception Hallucinations  Hallucination Auditory;Visual  Judgment Limited  Confusion WDL  Danger to Self  Current suicidal ideation? Passive  Self-Injurious Behavior No self-injurious ideation or behavior indicators observed or expressed   Agreement Not to Harm Self Yes  Description of Agreement verbal  Danger to Others  Danger to Others None reported or observed

## 2020-12-18 NOTE — [facility] Group Notes (Signed)
Adult Psychoeducational Group Note  Date:  12/18/2020 Time:  11:19 AM  Group Topic/Focus:  Healthy Communication:   The focus of this group is to discuss communication, barriers to communication, as well as healthy ways to communicate with others.  Participation Level:  Active  Participation Quality:  Appropriate and Attentive  Affect:  Appropriate  Cognitive:  Appropriate  Insight: Good  Engagement in Group:  Engaged and Supportive  Modes of Intervention:  Socialization and Support  Additional Comments:    Chase Picket 12/18/2020, 11:19 AM

## 2020-12-19 DIAGNOSIS — F4310 Post-traumatic stress disorder, unspecified: Secondary | ICD-10-CM | POA: Diagnosis not present

## 2020-12-19 MED ORDER — METHOCARBAMOL 500 MG PO TABS
500.0000 mg | ORAL_TABLET | Freq: Three times a day (TID) | ORAL | Status: AC
  Administered 2020-12-19 – 2020-12-20 (×4): 500 mg via ORAL
  Filled 2020-12-19 (×4): qty 1
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
## 2020-12-19 NOTE — BHH Group Notes (Signed)
Spiritual care group on grief and loss facilitated by chaplain Janne Napoleon, [facility]   Group Goal:   Support / Education around grief and loss   Members engage in facilitated group support and psycho-social education.   Group Description:   Following introductions and group rules, group members engaged in facilitated group dialog and support around topic of loss, with particular support around experiences of loss in their lives. Group Identified types of loss (relationships / self / things) and identified patterns, circumstances, and changes that precipitate losses. Reflected on thoughts / feelings around loss, normalized grief responses, and recognized variety in grief experience. Group noted Worden's four tasks of grief in discussion.   Group drew on Adlerian / Rogerian, narrative, MI,   Patient Progress:  Danely participated in group and shared about her loss of her father and about how it affected her.  She also later asked me for resources for Christian couple's counseling and addiction resources.  [place], Bcc Pager, [phone] 3:13 PM

## 2020-12-19 NOTE — Progress Notes (Signed)
Pt continues to endorse AVH, pt given PRN Vistaril per [name] with HS medication    12/19/20 2300  Psych Admission Type (Psych Patients Only)  Admission Status Voluntary  Psychosocial Assessment  Patient Complaints Anxiety  Eye Contact Brief  Facial Expression Anxious;Worried  Affect Appropriate to circumstance  Speech Unremarkable  Interaction Cautious  Motor Activity Fidgety;Restless  Appearance/Hygiene Poor hygiene  Behavior Characteristics Anxious  Mood Anxious  Thought Process  Coherency WDL  Content Blaming self;Blaming others  Delusions Paranoid  Perception Hallucinations  Hallucination Auditory;Visual  Judgment Limited  Confusion WDL  Danger to Self  Current suicidal ideation? Passive  Self-Injurious Behavior No self-injurious ideation or behavior indicators observed or expressed   Agreement Not to Harm Self Yes  Description of Agreement verbal  Danger to Others  Danger to Others None reported or observed

## 2020-12-19 NOTE — Progress Notes (Signed)
Recreation Therapy Notes  Date: 8.22.22 Time: 0930 Location: [address] Dayroom  Group Topic: Stress Management   Goal Area(s) Addresses:  Patient will actively participate in stress management techniques presented during session.  Patient will successfully identify benefit of practicing stress management post d/c.   Behavioral Response:  Appropriate  Intervention: Relaxation exercise with ambient sound and script   Activity: Guided Imagery. LRT provided education, instruction, and demonstration on practice of visualization via guided imagery. Patient was asked to participate in the technique introduced during session.  Patients were given suggestions of ways to access scripts post d/c and encouraged to explore Youtube and other apps available on smartphones, tablets, and computers.  Education:  Stress Management, Discharge Planning.   Education Outcome:  Acknowledges education  Clinical Observations/Feedback: Patient actively engaged in technique introduced, expressed no concerns and demonstrated ability to practice skill independently post d/c.      Victorino Sparrow, LRT/CTRS         Victorino Sparrow A 12/19/2020 12:09 PM

## 2020-12-19 NOTE — Progress Notes (Addendum)
CSW met with patient and discussed resources.  Patient reports she is not interested in a residential substance use program. Patient is motivated to get out and get her kids back. Patient has revoked consent to [place] worker and shared that she feels CPS worker is not on her side. Patient discussed continued interest in [facility] program. Patient is open to getting connected to [facility] for outpatient therapy and med management.    CSW spoke to patient about [facility] residential programs but patient is not interested in long term stays.  Patient reports that she has been feeling more depressed and has not been sleeping well.  Patient asked if they were going to change her medications and CSW encouraged her to speak to provider.    Later patient came up to Education officer, museum and agreed to referral to [facility] and decided that this Education officer, museum can speak with her CPS worker regarding steps she needs to take to get her kids back.   Talea Manges, LCSW, [place] Social Worker  [facility]

## 2020-12-19 NOTE — BHH Group Notes (Signed)
LCSW Group Therapy Note    Type of Therapy and Topic:  Group Therapy: Anger and Coping Skills  Participation Level:  Active   Description of Group:   In this group, patients learned how to recognize the physical, cognitive, emotional, and behavioral responses they have to anger-provoking situations.  They identified how they usually or often react when angered, and learned how healthy and unhealthy coping skills work initially, but the unhealthy ones stop working.   They analyzed how their frequently-chosen coping skill is possibly beneficial and how it is possibly unhelpful.  The group discussed a variety of healthier coping skills that could help in resolving the actual issues, as well as how to go about planning for the the possibility of future similar situations.  Therapeutic Goals: Patients will identify one thing that makes them angry and how they feel emotionally and physically, what their thoughts are or tend to be in those situations, and what healthy or unhealthy coping mechanism they typically use Patients will identify how their coping technique works for them, as well as how it works against them. Patients will explore possible new behaviors to use in future anger situations. Patients will learn that anger itself is normal and cannot be eliminated, and that healthier coping skills can assist with resolving conflict rather than worsening situations.  Summary of Patient Progress:  Patient participated appropriately in group. Patient discussed how hearing voices that are not really there makes her angry.  Patient also discussed how engaging with her faith and praying assists with her anger.  Patient participated in discussion and feedback of appropriate coping mechanisms for anger and engaged in discussion around the benefits and negatives of anger.   Therapeutic Modalities:   Cognitive Behavioral Therapy Motivation Interviewing   Giorgia Wahler, LCSW, [place] Social  Worker  [facility]

## 2020-12-19 NOTE — Progress Notes (Signed)
D:  Patient's self inventory sheet, patient has poor sleep, sleep medication not helpful.  Fair appetite, low energy level, poor concentration.  Rated hopeless 9.  Physical problems, sore, worse pain #5.  Goal is sleep, finish puzzle.  Talk to peers.  Anxiety, steep hallucinations.  No discharge plans. A:  Medications administrations administered per MD orders.  Emotional support and encouragement given patient. R:  Patient stated she hears her thoughts and sees movement of people, eye movement. Safety maintained with 15 minute checks.

## 2020-12-19 NOTE — BHH Group Notes (Addendum)
Pt did not attend AA meeting this evening.

## 2020-12-19 NOTE — Plan of Care (Signed)
Nurse discussed coping skills with patient.  

## 2020-12-20 DIAGNOSIS — F4310 Post-traumatic stress disorder, unspecified: Secondary | ICD-10-CM | POA: Diagnosis not present

## 2020-12-20 MED ORDER — OLANZAPINE 7.5 MG PO TABS
15.0000 mg | ORAL_TABLET | Freq: Every day | ORAL | Status: DC
  Administered 2020-12-20 – 2020-12-22 (×3): 15 mg via ORAL
  Filled 2020-12-20 (×4): qty 2
  Filled 2020-12-20: qty 6
  Filled 2020-12-20: qty 2
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
## 2020-12-20 NOTE — Progress Notes (Signed)
D:  Patient's self inventory form, patient has poor sleep, sleep medication not working.  Fair appetite, low energy level, poor concentration.  Rated depression 7, hopeless 5, anxiety 9.  Denied withdrawals.  Denied SI.   Physical problems, back, worst pain #7 in past 24 hours.  Goal is relaxation on her own.  Will practice at night time.  No discharge plans. A:  Medications administered per MD orders.  Emotional support and encouragement given patient. R:  Denied SI and HI.  Does hear mumblings from dark people nearby. Safety maintained with 15 minute checks.

## 2020-12-20 NOTE — Progress Notes (Signed)
Pt given PRN Vistaril per MAR with HS medication , pt educated on relaxation techniques and pt stated she has been working on relaxing much of the day. Pt stated she was doing a little better today. Pt has been visible on the unit much of the evening.     12/20/20 2300  Psych Admission Type (Psych Patients Only)  Admission Status Voluntary  Psychosocial Assessment  Patient Complaints Anxiety  Eye Contact Brief  Facial Expression Anxious;Worried  Affect Appropriate to circumstance  Speech Unremarkable  Interaction Cautious  Motor Activity Fidgety;Restless  Appearance/Hygiene Poor hygiene  Behavior Characteristics Anxious  Mood Depressed;Anxious  Thought Process  Coherency WDL  Content Blaming self;Blaming others  Delusions Paranoid  Perception Hallucinations  Hallucination Auditory;Visual  Judgment Limited  Confusion WDL  Danger to Self  Current suicidal ideation? Denies  Self-Injurious Behavior No self-injurious ideation or behavior indicators observed or expressed   Agreement Not to Harm Self Yes  Description of Agreement verbal  Danger to Others  Danger to Others None reported or observed

## 2020-12-20 NOTE — Progress Notes (Signed)
Adult Psychoeducational Group Note  Date:  12/20/2020 Time:  10:40 PM  Group Topic/Focus:  Wrap-Up Group:   The focus of this group is to help patients review their daily goal of treatment and discuss progress on daily workbooks.  Participation Level:  Minimal  Participation Quality:  Appropriate  Affect:  Appropriate  Cognitive:  Appropriate  Insight: Appropriate  Engagement in Group:  Engaged  Modes of Intervention:  Discussion  Additional Comments: Review patient's goals, progress, and objectives on this date and examine group skills pertaining to treatment. Discuss symptoms, supportive counseling, identification, and exploration of emotions. In addition, we discussed substituting negative/maladaptive thoughts or images with positive/adaptive ones. Pt self-reporting has an overall day of 7.5 out of 10 on this date. End of Wrap-Up Group progress note.      Trinna Post 12/20/2020, 10:40 PM

## 2020-12-20 NOTE — Plan of Care (Signed)
Nurse discussed coping skills with patient.  

## 2020-12-21 ENCOUNTER — Other Ambulatory Visit: Payer: Medicaid Other | Admitting: Adult Health

## 2020-12-21 DIAGNOSIS — F4310 Post-traumatic stress disorder, unspecified: Secondary | ICD-10-CM | POA: Diagnosis not present

## 2020-12-21 MED ORDER — FLUOXETINE HCL 20 MG PO CAPS
40.0000 mg | ORAL_CAPSULE | Freq: Every day | ORAL | Status: DC
  Administered 2020-12-21 – 2020-12-23 (×3): 40 mg via ORAL
  Filled 2020-12-21 (×6): qty 2

## 2020-12-21 NOTE — [facility] Group Notes (Signed)
[facility] Group Notes:  (Nursing/MHT/Case Management/Adjunct)  Date:  12/21/2020  Time:  10:06 AM  Type of Therapy:  Pts were asked to talk about their daily and long term goals   Participation Level:  Active  Participation Quality:  Appropriate  Affect:  Appropriate  Cognitive:  Alert  Insight:  Appropriate  Engagement in Group:  Engaged  Modes of Intervention:  Discussion  Summary of Progress/Problems: Pt stated their daily goal is to remember everyday cant be great. Long term is learning how to remain relaxed by utilizing coping skills Michelle Page 12/21/2020, 10:06 AM

## 2020-12-21 NOTE — Progress Notes (Signed)
Recreation Therapy Notes  Date:  8.24.22 Time: 0930 Location: [address] Dayroom  Group Topic: Stress Management  Goal Area(s) Addresses:  Patient will identify positive stress management techniques. Patient will identify benefits of using stress management post d/c.  Behavioral Response: Attentive  Intervention: Stress Management  Activity :  Meditation.  LRT played a meditation that focused on being resilient in the face of adversity and managing reactions to situations beyond your control.  Education:  Stress Management, Discharge Planning.   Education Outcome: Acknowledges Education  Clinical Observations/Feedback: Despite interruptions, pt was engaged in group.     Victorino Sparrow, LRT/CTRS         Victorino Sparrow A 12/21/2020 12:09 PM

## 2020-12-21 NOTE — Tx Team (Signed)
Interdisciplinary Treatment and Diagnostic Plan Update  12/21/2020 Time of Session: 11:15am Michelle Page MRN: [id number]  Principal Diagnosis: Post traumatic stress disorder  Secondary Diagnoses: Principal Problem:   Post traumatic stress disorder Active Problems:   MDD (major depressive disorder), recurrent, severe, with psychosis ([place])   Moderate cocaine use disorder ([place])   Substance abuse ([place])   Alcohol abuse   Current Medications:  Current Facility-Administered Medications  Medication Dose Route Frequency Provider Last Rate Last Admin   acetaminophen (TYLENOL) tablet 650 mg  650 mg Oral Q6H PRN Prescilla Sours, PA-C   650 mg at 12/17/20 2059   alum & mag hydroxide-simeth (MAALOX/MYLANTA) 200-200-20 MG/5ML suspension 30 mL  30 mL Oral Q4H PRN Margorie John W, PA-C       FLUoxetine (PROZAC) capsule 40 mg  40 mg Oral Daily Arthor Captain, MD   40 mg at 12/21/20 [id number]   hydrOXYzine (ATARAX/VISTARIL) tablet 25 mg  25 mg Oral TID PRN Prescilla Sours, PA-C   25 mg at 12/21/20 1143   magnesium hydroxide (MILK OF MAGNESIA) suspension 30 mL  30 mL Oral Daily PRN Prescilla Sours, PA-C       multivitamin with minerals tablet 1 tablet  1 tablet Oral Daily Arthor Captain, MD   1 tablet at 12/21/20 [id number]   nicotine (NICODERM CQ - dosed in mg/24 hours) patch 14 mg  14 mg Transdermal Daily Margorie John W, PA-C   14 mg at 12/16/20 0800   OLANZapine (ZYPREXA) tablet 15 mg  15 mg Oral QHS Arthor Captain, MD   15 mg at 12/20/20 2122   prazosin (MINIPRESS) capsule 1 mg  1 mg Oral QHS Ethelene Hal, NP   1 mg at 12/20/20 2122   thiamine tablet 100 mg  100 mg Oral Daily Arthor Captain, MD   100 mg at 12/21/20 [id number]   PTA Medications: Medications Prior to Admission  Medication Sig Dispense Refill Last Dose   buPROPion (WELLBUTRIN XL) 300 MG 24 hr tablet Take 1 tablet (300 mg total) by mouth every morning. 90 tablet 0 12/14/2020   HYDROcodone-acetaminophen (NORCO/VICODIN) 5-325 MG tablet Take  one tab po q 4 hrs prn pain (Patient taking differently: Take 1 tablet by mouth every 4 (four) hours as needed for moderate pain.) 10 tablet 0 12/14/2020   ibuprofen (ADVIL) 800 MG tablet Take 1 tablet (800 mg total) by mouth 3 (three) times daily. Take with food 21 tablet 0 12/14/2020   methocarbamol (ROBAXIN) 500 MG tablet Take 1 tablet (500 mg total) by mouth 3 (three) times daily. 21 tablet 0 12/14/2020   OLANZapine (ZYPREXA) 5 MG tablet Take 1 tablet (5 mg total) by mouth at bedtime. 90 tablet 0 12/14/2020   sertraline (ZOLOFT) 100 MG tablet Take 2 tablets (200 mg total) by mouth daily. 180 tablet 0 12/14/2020   traZODone (DESYREL) 150 MG tablet Take 1 tablet (150 mg total) by mouth at bedtime. 90 tablet 0 12/14/2020   zolpidem (AMBIEN) 5 MG tablet Take 1 tablet (5 mg total) by mouth at bedtime as needed for sleep. (Patient taking differently: Take 5 mg by mouth at bedtime.) 15 tablet 2 12/13/2020    Patient Stressors:    Patient Strengths:    Treatment Modalities: Medication Management, Group therapy, Case management,  1 to 1 session with clinician, Psychoeducation, Recreational therapy.   Physician Treatment Plan for Primary Diagnosis: Post traumatic stress disorder Long Term Goal(s): Improvement in symptoms so as ready for  discharge   Short Term Goals: Ability to identify changes in lifestyle to reduce recurrence of condition will improve Ability to verbalize feelings will improve Ability to disclose and discuss suicidal ideas Ability to demonstrate self-control will improve Ability to identify and develop effective coping behaviors will improve Ability to maintain clinical measurements within normal limits will improve Compliance with prescribed medications will improve Ability to identify triggers associated with substance abuse/mental health issues will improve  Medication Management: Evaluate patient's response, side effects, and tolerance of medication regimen.  Therapeutic  Interventions: 1 to 1 sessions, Unit Group sessions and Medication administration.  Evaluation of Outcomes: Progressing  Physician Treatment Plan for Secondary Diagnosis: Principal Problem:   Post traumatic stress disorder Active Problems:   MDD (major depressive disorder), recurrent, severe, with psychosis ([place])   Moderate cocaine use disorder ([place])   Substance abuse ([place])   Alcohol abuse  Long Term Goal(s): Improvement in symptoms so as ready for discharge   Short Term Goals: Ability to identify changes in lifestyle to reduce recurrence of condition will improve Ability to verbalize feelings will improve Ability to disclose and discuss suicidal ideas Ability to demonstrate self-control will improve Ability to identify and develop effective coping behaviors will improve Ability to maintain clinical measurements within normal limits will improve Compliance with prescribed medications will improve Ability to identify triggers associated with substance abuse/mental health issues will improve     Medication Management: Evaluate patient's response, side effects, and tolerance of medication regimen.  Therapeutic Interventions: 1 to 1 sessions, Unit Group sessions and Medication administration.  Evaluation of Outcomes: Progressing   RN Treatment Plan for Primary Diagnosis: Post traumatic stress disorder Long Term Goal(s): Knowledge of disease and therapeutic regimen to maintain health will improve  Short Term Goals: Ability to participate in decision making will improve, Ability to verbalize feelings will improve, and Ability to identify and develop effective coping behaviors will improve  Medication Management: RN will administer medications as ordered by provider, will assess and evaluate patient's response and provide education to patient for prescribed medication. RN will report any adverse and/or side effects to prescribing provider.  Therapeutic Interventions: 1 on 1 counseling  sessions, Psychoeducation, Medication administration, Evaluate responses to treatment, Monitor vital signs and CBGs as ordered, Perform/monitor CIWA, COWS, AIMS and Fall Risk screenings as ordered, Perform wound care treatments as ordered.  Evaluation of Outcomes: Progressing   LCSW Treatment Plan for Primary Diagnosis: Post traumatic stress disorder Long Term Goal(s): Safe transition to appropriate next level of care at discharge, Engage patient in therapeutic group addressing interpersonal concerns.  Short Term Goals: Engage patient in aftercare planning with referrals and resources, Increase ability to appropriately verbalize feelings, and Increase emotional regulation  Therapeutic Interventions: Assess for all discharge needs, 1 to 1 time with Social worker, Explore available resources and support systems, Assess for adequacy in community support network, Educate family and significant other(s) on suicide prevention, Complete Psychosocial Assessment, Interpersonal group therapy.  Evaluation of Outcomes: Progressing   Progress in Treatment: Attending groups: Yes. Participating in groups: Yes. Taking medication as prescribed: Yes. Toleration medication: Yes. Family/Significant other contact made: Yes, individual(s) contacted:  friend Patient understands diagnosis: Yes. Discussing patient identified problems/goals with staff: Yes. Medical problems stabilized or resolved: Yes. Denies suicidal/homicidal ideation: Yes. Issues/concerns per patient self-inventory: No. Other: None  New problem(s) identified: No, Describe:  None  New Short Term/Long Term Goal(s):medication stabilization, elimination of SI thoughts, development of comprehensive mental wellness plan.   Patient Goals:  "get stable."  Discharge Plan or Barriers: Patient recently admitted. CSW will continue to follow and assess for appropriate referrals and possible discharge planning.   Reason for Continuation of  Hospitalization: Medication stabilization Suicidal ideation Withdrawal symptoms  Estimated Length of Stay: 3-5 days  Attendees: Patient: Michelle Page 12/21/2020   Physician: Lestine Mount, DO 12/21/2020   Nursing:  12/21/2020   RN Care Manager: 12/21/2020   Social Worker: Verdis Frederickson, [place] 12/21/2020   Recreational Therapist:  12/21/2020   Other:  12/21/2020   Other:  12/21/2020   Other: 12/21/2020     Scribe for Treatment Team: Darleen Crocker, [place] 12/21/2020 2:00 PM

## 2020-12-21 NOTE — Progress Notes (Signed)
Adult Psychoeducational Group Note  Date:  12/21/2020 Time:  9:52 PM  Group Topic/Focus:  Wrap-Up Group:  The focus of this group is to help patients review their daily goal of treatment and discuss progress on daily workbooks.  Participation Level:  Active  Participation Quality:  Appropriate  Affect:  Appropriate  Cognitive:  Appropriate  Insight: Appropriate  Engagement in Group:  Supportive  Modes of Intervention:  Discussion  Additional Comments: The patient participated in NA Group on this date with volunteers on how to decrease dependence on relationships while beginning to meet their own needs, build confidence, and practice assertiveness. Volunteers facilitated a discussion with the group regarding triggers and identified emotional and situational factors that affect their desire to use--the end of the Wrap-Up progress note for the NA support group.       Trinna Post 12/21/2020, 9:52 PM

## 2020-12-21 NOTE — Progress Notes (Signed)
Pt state she woke up 3X last night with scheduled medications. Pt rates anxiety 5/10, depression 3/10. Pt denies SI/HI/AH. Pt endorses VH ("I see lights"). Pt was anxious/restless on approach; observed pacing the hall. Pt remains safe.
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
## 2020-12-21 NOTE — Progress Notes (Signed)
Pt continued to endorse anxiety, pt given PRN Vistaril with HS medication. Pt worried about medication changes. Writer educated pt on continued relaxation techniques to help her sleep better    12/21/20 2200  Psych Admission Type (Psych Patients Only)  Admission Status Voluntary  Psychosocial Assessment  Patient Complaints Sleep disturbance  Eye Contact Brief  Facial Expression Anxious;Worried  Affect Appropriate to circumstance  Speech Unremarkable  Interaction Cautious  Motor Activity Fidgety;Restless  Appearance/Hygiene Poor hygiene  Behavior Characteristics Anxious  Mood Anxious;Depressed  Thought Process  Coherency WDL  Content Blaming self;Blaming others  Delusions Paranoid  Perception Hallucinations  Hallucination Visual  Judgment Limited  Confusion WDL  Danger to Self  Current suicidal ideation? Denies  Danger to Others  Danger to Others None reported or observed

## 2020-12-21 NOTE — [facility] Group Notes (Signed)
Pt attended 4pm psycho-ed group for the day.  Pt received education around Archivist. Pt engaged in conversation around skill building. Pt's engagement was appropriate.

## 2020-12-21 NOTE — [facility] Group Notes (Signed)
Type of Therapy and Topic:  Group Therapy - Healthy vs Unhealthy Coping Skills   Participation Level:  Active    Description of Group The focus of this group was to determine what unhealthy coping techniques typically are used by group members and what healthy coping techniques would be helpful in coping with various problems. Patients were guided in becoming aware of the differences between healthy and unhealthy coping techniques. Patients were asked to identify 2-3 healthy coping skills they would like to learn to use more effectively.   Therapeutic Goals 1. Patients learned that coping is what human beings do all day long to deal with various situations in their lives 2. Patients defined and discussed healthy vs unhealthy coping techniques 3. Patients identified their preferred coping techniques and identified whether these were healthy or unhealthy 4. Patients determined 2-3 healthy coping skills they would like to become more familiar with and use more often. 5. Patients provided support and ideas to each other     Summary of Patient Progress:  Pt was provided with a worksheet to complete and share with the group.  Pt accepted this worksheet and was appropriate.  Pt was encouraged to speak with the social worker if they had any questions or concerns.

## 2020-12-22 DIAGNOSIS — F4310 Post-traumatic stress disorder, unspecified: Secondary | ICD-10-CM | POA: Diagnosis not present
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
## 2020-12-22 NOTE — Progress Notes (Signed)
D:  Patient has poor sleep, no sleep medication.  Fair appetite, low energy level, poor concentration.  Rated depression, anxiety and hopeless #6.  Denied withdrawals.  Denied SI.  Denied physical pain.  Goal is anxiety.  Difficult to sit through groups, feel tired, low energy.  No discharge plans. A:  Medications administered per MD orders.  Emotional support and encouragement given patient. R:  Denied SI and HI, contracts for safety.  Denied A/V hallucinations.  Safety maintained with 15 minute checks.

## 2020-12-22 NOTE — Progress Notes (Signed)
Adult Psychoeducational Group Note  Date:  12/22/2020 Time:  5:40 PM  Group Topic/Focus:  Coping With Mental Health Crisis:   The purpose of this group is to help patients identify strategies for coping with mental health crisis.  Group discusses possible causes of crisis and ways to manage them effectively.  Participation Level:  Active  Participation Quality:  Appropriate  Affect:  Appropriate  Cognitive:  Alert  Insight: Appropriate  Engagement in Group:  Engaged  Modes of Intervention:  Discussion  Additional Comments:  Pt attended group and participated in discussion.  Franki Alcaide R Jericka Kadar 12/22/2020, 5:40 PM

## 2020-12-22 NOTE — Progress Notes (Signed)
CSW spoke with patient regarding discharge and plans.  Patient has been calling [facility] and they let her know that a bed would probably not be available until the 2nd week of September.  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  Patient reports that she would like to be taken home by her safety sister, Leafy Ro, and then have follow up outpatient appointments at [facility]. Patient is agreeable to this.  Patient would like to be discharged today and CSW encouraged patient to speak with provider but she believed discharge would be tomorrow.     Wessie Shanks, LCSW, [place] Social Worker  [facility]

## 2020-12-22 NOTE — Progress Notes (Signed)
  D:  Pt presents with anxiety and depression on assessment.  Pt states, "I have mixed feelings."  Pt reports high anxiety about what's going to happen."  Pt reports support in the form of "church family".  Pt tearful on assessment.  Pt states she doesn't know how she's going to manage with kids not being there when she returns."  Pt worries about kids going to into school.  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  A:  Labs/Vital monitored; Medication administered; Provided reassurance.  R:  Pt remains safe on unit with Q 15 minute safety checks.      12/22/20 2300  Psych Admission Type (Psych Patients Only)  Admission Status Voluntary  Psychosocial Assessment  Patient Complaints Anxiety;Depression  Eye Contact Brief  Facial Expression Anxious;Worried  Affect Appropriate to circumstance  Speech Unremarkable  Interaction Cautious  Motor Activity Fidgety;Restless  Appearance/Hygiene Poor hygiene  Behavior Characteristics Anxious  Mood Depressed;Anxious  Thought Process  Coherency WDL  Content Blaming self;Blaming others  Delusions Paranoid  Perception Hallucinations  Hallucination None reported or observed  Judgment Limited  Confusion WDL  Danger to Self  Current suicidal ideation? Denies  Self-Injurious Behavior No self-injurious ideation or behavior indicators observed or expressed   Agreement Not to Harm Self Yes  Description of Agreement verbally contracts for safety  Danger to Others  Danger to Others None reported or observed

## 2020-12-22 NOTE — [facility] Group Notes (Signed)
Adult Psychoeducational Group Note  Date:  12/22/2020 Time:  9:34 AM  Group Topic/Focus:  Goals Group:   The focus of this group is to help patients establish daily goals to achieve during treatment and discuss how the patient can incorporate goal setting into their daily lives to aide in recovery.  Participation Level:  None  Participation Quality:  Resistant  Affect:  Depressed  Cognitive:  Lacking  Insight: Lacking  Engagement in Group:  Poor  Modes of Intervention:  Orientation  Additional Comments:  Chase Picket 12/22/2020, 9:34 AM

## 2020-12-22 NOTE — Plan of Care (Signed)
Nurse discussed coping skills, anxiety, depression with patient.  

## 2020-12-22 NOTE — Progress Notes (Signed)
[place] Group Notes:  (Nursing/MHT/Case Management/Adjunct)  Date:  12/22/2020  Time:  2000  Type of Therapy:   wrap up group  Participation Level:  Active  Participation Quality:  Appropriate, Attentive, Sharing, and Supportive  Affect:  Depressed  Cognitive:  Alert  Insight:  Improving  Engagement in Group:  Supportive  Modes of Intervention:  Clarification, Education, and Support  Summary of Progress/Problems: Positive thinking and positive change were discussed.   Shellia Cleverly 12/22/2020, 8:39 PM

## 2020-12-22 NOTE — Plan of Care (Signed)
°  Problem: Education: °Goal: Emotional status will improve °Outcome: Progressing °Goal: Mental status will improve °Outcome: Progressing °  °Problem: Activity: °Goal: Interest or engagement in activities will improve °Outcome: Progressing °  °

## 2020-12-22 NOTE — Progress Notes (Signed)
   BP elevated - Pt with high anxiety   12/22/20 2030  Vital Signs  Pulse Rate 76  BP (!) 124/103  BP Method Automatic  Pain Assessment  Pain Scale 0-10  Pain Score 0   BP Recheck - BP normal    12/22/20 2149  Vital Signs  Pulse Rate 66  BP 128/78  BP Location Left Arm  BP Method Automatic  Patient Position (if appropriate) Sitting

## 2020-12-23 DIAGNOSIS — F4310 Post-traumatic stress disorder, unspecified: Secondary | ICD-10-CM | POA: Diagnosis not present

## 2020-12-23 MED ORDER — FLUOXETINE HCL 40 MG PO CAPS: 40.0000 mg | ORAL_CAPSULE | Freq: Every day | ORAL | 0 refills | Status: DC

## 2020-12-23 MED ORDER — PRAZOSIN HCL 1 MG PO CAPS: 1.0000 mg | ORAL_CAPSULE | Freq: Every day | ORAL | 0 refills | Status: DC

## 2020-12-23 MED ORDER — OLANZAPINE 15 MG PO TABS: 15.0000 mg | ORAL_TABLET | Freq: Every day | ORAL | 0 refills | Status: DC

## 2020-12-23 MED ORDER — NICOTINE 14 MG/24HR TD PT24: 14.0000 mg | MEDICATED_PATCH | Freq: Every day | TRANSDERMAL | 0 refills | Status: DC

## 2020-12-23 MED ORDER — HYDROXYZINE HCL 25 MG PO TABS: 25.0000 mg | ORAL_TABLET | Freq: Three times a day (TID) | ORAL | 0 refills | Status: DC | PRN

## 2020-12-23 NOTE — Progress Notes (Signed)
RN met with pt and reviewed pt's discharge instructions.  Pt verbalized understanding of discharge instructions and pt did not have any questions. RN reviewed and provided pt with a copy of SRA, AVS and Transition Record.  RN returned pt's belongings to pt.  Pt denied SI/HI/AVH and voiced no concerns.  Pt was appreciative of the care pt received at [facility].  Patient discharged to the lobby without incident.

## 2020-12-23 NOTE — Progress Notes (Signed)
Recreation Therapy Notes  Date: 8.26.22 Time: 0930 Location: [address] Dayroom  Group Topic: Stress Management   Goal Area(s) Addresses:  Patient will actively participate in stress management techniques presented during session.  Patient will successfully identify benefit of practicing stress management post d/c.   Intervention: Relaxation exercise with ambient sound and script   Activity: Guided Imagery. LRT provided education, instruction, and demonstration on practice of visualization via guided imagery. Patients was asked to participate in the technique introduced during session. Patients were given suggestions of ways to access scripts post d/c and encouraged to explore Youtube and other apps available on smartphones, tablets, and computers.  Education:  Stress Management, Discharge Planning.   Education Outcome: Acknowledges education  Clinical Observations/Feedback: Patient did not attend group session.    Victorino Sparrow, LRT/CTRS         Ria Comment, Tremel Setters A 12/23/2020 10:59 AM

## 2020-12-23 NOTE — Progress Notes (Signed)
  [facility] Adult Case Management Discharge Plan :  Will you be returning to the same living situation after discharge:  No. Patient will be going home with friend to assist with support  At discharge, do you have transportation home?: Yes,  friend, Leafy Ro will pick patient up. Do you have the ability to pay for your medications: Yes,  insurance  Release of information consent forms completed and in the chart;  Patient's signature needed at discharge.  Patient to Follow up at:  Follow-up Information     Services, [facility]. Go on 12/27/2020.   Why: You have a hospital follow up appointment for therapy and medication management services on 12/27/20 at 9:00 am. [facility] also provides outpatient substance use therapy. Please inquire about services at this appt.  This appointment will be held in person.  * Please bring your photo ID, insurance card, and social security card (if available). Contact information: [place] [address] [phone]         [facility] Follow up.   Specialty: Addiction Medicine Why: Referral made. Call daily for bed availability.  They report that the next bed available may not be until 2nd week of September. Contact information: [id number] [place] [address] [phone]         [facility] Follow up on 12/28/2020.   Specialty: Behavioral Health Why: You have an appointment for therapy services on 12/28/20 at 2:00 pm.  Please call for details. Contact information: [place] [phone]                Next level of care provider has access to [place] and Suicide Prevention discussed: Yes,  Leafy Ro, friend     Has patient been referred to the Quitline?: N/A patient is not a smoker  Patient has been referred for addiction treatment: Yes  [facility], LCSW 12/23/2020, 10:54 AM
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
## 2020-12-23 NOTE — BHH Group Notes (Signed)
[place] Group Notes:  (Nursing/MHT/Case Management/Adjunct)  Date:  12/23/2020  Time:  9:45 AM  Type of Therapy:  Group Therapy  Participation Level:  Active  Participation Quality:  Appropriate  Affect:  Appropriate  Cognitive:  Appropriate  Insight:  Appropriate  Engagement in Group:  Engaged  Modes of Intervention:  Discussion  Summary of Progress/Problems:Pt was engaged in group session.  Garvin Fila 12/23/2020, 9:45 AM

## 2020-12-23 NOTE — Care Management Important Message (Signed)
Medicare IM given to social work to give to the patient to sign.

## 2020-12-26 ENCOUNTER — Telehealth: Payer: Self-pay | Admitting: *Deleted

## 2020-12-26 NOTE — Telephone Encounter (Signed)
Transition Care Management Follow-up Telephone Call Date of discharge and from where: [facility]-20 [facility]  How have you been since you were released from the hospital? Been better since being home  Any questions or concerns? No  Items Reviewed: Did the pt receive and understand the discharge instructions provided? Yes  Medications obtained and verified? Yes  Other? No  Any new allergies since your discharge? No  Dietary orders reviewed? Yes Do you have support at home? Yes   Home Care and Equipment/Supplies: Were home health services ordered? not applicable If so, what is the name of the agency? NA  Has the agency set up a time to come to the patient's home? not applicable Were any new equipment or medical supplies ordered?  No What is the name of the medical supply agency? NA Were you able to get the supplies/equipment? not applicable Do you have any questions related to the use of the equipment or supplies? No  Functional Questionnaire: (I = Independent and D = Dependent) ADLs: I  Bathing/Dressing- I  Meal Prep- I  Eating- I  Maintaining continence- I  Transferring/Ambulation-  I Managing Meds- I  Follow up appointments reviewed:  PCP Hospital f/u appt confirmed? Yes  [facility] on 9-6-2 Are transportation arrangements needed? Yes  If their condition worsens, is the pt aware to call PCP or go to the Emergency Dept.? Yes Was the patient provided with contact information for the PCP's office or ED? Yes Was to pt encouraged to call back with questions or concerns? Yes

## 2020-12-28 ENCOUNTER — Other Ambulatory Visit: Payer: Self-pay

## 2020-12-28 ENCOUNTER — Ambulatory Visit (INDEPENDENT_AMBULATORY_CARE_PROVIDER_SITE_OTHER): Payer: Medicaid Other | Admitting: Clinical

## 2020-12-28 DIAGNOSIS — F333 Major depressive disorder, recurrent, severe with psychotic symptoms: Secondary | ICD-10-CM
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
## 2020-12-31 ENCOUNTER — Ambulatory Visit (HOSPITAL_COMMUNITY)
Admission: RE | Admit: 2020-12-31 | Discharge: 2020-12-31 | Disposition: A | Discharge status: Home / Self Care | Payer: Medicaid Other | Attending: Psychiatry | Admitting: Psychiatry

## 2020-12-31 NOTE — H&P (Addendum)
Behavioral Health Medical Screening Exam  Michelle Page is a 45 y.o. female. Presented to [facility] seeking an inpatient bed due to substance abuse history.  Reported " my boyfriend wants me to get help with my anxiety, before I start using again."   Michelle Page reports feeling very anxious and has the urge to start using drugs again.  States she has had 2 beers.  Denying suicidal or homicidal ideations.  Reports chronic auditory hallucinations. Stated she has taken medications as directed since her discharge from inpatient 1 week ago.  Reports she has additional outpatient resources however is not able to start the program until 9/14.  She reported going to 4 different hospital seeking inpatient admission today. Support, encouragement and reassurances was provided.   Total Time spent with patient: 15 minutes  Psychiatric Specialty Exam:  Presentation  General Appearance: Appropriate for Environment  Eye Contact:Good  Speech:Clear and Coherent  Speech Volume:Normal  Handedness: Right  Mood and Affect  Mood:Anxious  Affect:Congruent; Appropriate   Thought Process  Thought Processes:Coherent  Descriptions of Associations:Intact  Orientation:Full (Time, Place and Person)  Thought Content:Logical  History of Schizophrenia/Schizoaffective disorder:No  Duration of Psychotic Symptoms:Greater than six months  Hallucinations:Hallucinations: Auditory Ideas of Reference:None  Suicidal Thoughts:Suicidal Thoughts: No Homicidal Thoughts:Homicidal Thoughts: No  Sensorium  Memory:Immediate Good; Recent Good; Remote Good  Judgment:Good  Insight:Good   Executive Functions  Concentration:Good  Attention Span:Good  [place] of Knowledge:Good  Language:Good   Psychomotor Activity  Psychomotor Activity: Psychomotor Activity: Normal  Assets  Assets:Communication Skills; Intimacy   Sleep  Sleep: No data recorded   Physical Exam: Physical  Exam Vitals reviewed.  Cardiovascular:     Rate and Rhythm: Normal rate and regular rhythm.  Pulmonary:     Effort: Pulmonary effort is normal.     Breath sounds: Normal breath sounds.  Neurological:     Mental Status: She is alert and oriented to person, place, and time.  Psychiatric:        Attention and Perception: Attention normal.        Mood and Affect: Mood normal.        Speech: Speech normal.        Behavior: Behavior normal.        Thought Content: Thought content normal.        Cognition and Memory: Cognition and memory normal.        Judgment: Judgment normal.   Review of Systems  HENT: Negative.    Eyes: Negative.   Cardiovascular: Negative.   Genitourinary: Negative.   Musculoskeletal: Negative.   Skin: Negative.   Endo/Heme/Allergies: Negative.   Psychiatric/Behavioral:  Positive for substance abuse.   All other systems reviewed and are negative. currently breastfeeding. There is no height or weight on file to calculate BMI.  Musculoskeletal: Strength & Muscle Tone: within normal limits Gait & Station: normal Patient leans: N/A   Recommendations: Keep follow-up appointment with outpatient resources  Based on my evaluation the patient does not appear to have an emergency medical condition.  Derrill [place], NP 12/31/2020, 6:07 PM

## 2020-12-31 NOTE — BH Assessment (Signed)
Patient is a 45 year old female that presents this date to [facility] voluntary as a walk in. Patient denies any S/I, H/I or AVH. Patient has a history per chart review of Schizoaffective disorder and alcohol and cocaine use/abuse. Patient reports this date that she "relapsed on a couple beers" although states she "didn't use anything else." Patient states she is followed OP by Elie Goody MD at [facility] that assists with medication management. Patient reports current compliance and feels her medications are working as indicated. Patient was recently discharged from [facility] on 12/23/20 and states she is supposed to start groups/counseling at [facility] on 9/14 although was "worried about relapsing before then." Patient denies access to weapons. Patient states she has a strong support system and has a sponsor at her local church that she was encouraged to utilize until her groups start. Patient was also provided with a list of local AA/NA meetings.       [name] NP writes: Michelle Page is a 45 y.o. female. Presented to [facility] seeking an inpatient bed due to substance abuse history.  Reported " my boyfriend wants me to get help with my anxiety, before I start using again."    Michelle Page reports feeling very anxious and has the urge to start using drugs again.  States she has had 2 beers.  Denying suicidal or homicidal ideations.  Reports chronic auditory hallucinations. Stated she has taken medications as directed since her discharge from inpatient 1 week ago.  Reports she has additional outpatient resources however is not able to start the program until 9/14.  She reported going to 4 different hospital seeking inpatient admission today. Support, encouragement and reassurances was provided.

## 2021-01-03 ENCOUNTER — Inpatient Hospital Stay: Payer: Medicaid Other | Admitting: Internal Medicine

## 2021-01-17 ENCOUNTER — Other Ambulatory Visit: Payer: Self-pay

## 2021-01-17 ENCOUNTER — Ambulatory Visit (INDEPENDENT_AMBULATORY_CARE_PROVIDER_SITE_OTHER): Payer: Medicaid Other | Admitting: Clinical

## 2021-01-17 DIAGNOSIS — F333 Major depressive disorder, recurrent, severe with psychotic symptoms: Secondary | ICD-10-CM

## 2021-01-17 NOTE — Progress Notes (Signed)
Virtual Visit via Video Note  I connected with Michelle Page on 01/17/21 at  2:00 PM EDT by a video enabled telemedicine application and verified that I am speaking with the correct person using two identifiers.  Location: Patient: Home Provider: Office   I discussed the limitations of evaluation and management by telemedicine and the availability of in person appointments. The patient expressed understanding and agreed to proceed.  THERAPIST PROGRESS NOTE   Session Time: 2:00PM-2:30PM   Participation Level: Active   Behavioral Response: DisheveledAlertDepressed   Type of Therapy: Individual Therapy   Treatment Goals addressed: Coping   Interventions: CBT and Motivational Interviewing   Summary: Michelle Page is a 45 y.o. female who presents with Schizoaffective Disorder Depressive type. The OPT therapist worked with the patient for her ongoing [place]. The OPT therapist utilized Motivational Interviewing to assist in creating therapeutic repore. The patient in the session was engaged and work in collaboration giving feedback about her triggers and symptoms over the past few weeks. The patient in this session noted that she still is struggling with hallucinations and is currently seeing a psychiatrist from [place] for ongoing medication therapy. The OPT therapist utilized Cognitive Behavioral Therapy through cognitive restructuring as well as worked with the patient on coping strategies to assist in management of symptoms. .    Suicidal/Homicidal: Nowithout intent/plan   Therapist Response: The OPT therapist worked with the patient for the patients scheduled session. The patient was engaged in her session and gave feedback in relation to triggers, symptoms, and behavior responses over the past few weeks. The OPT therapist worked with the patient utilizing an in session Cognitive Behavioral Therapy exercise. The patient was responsive in the session and verbalized,  " I still struggle with seeing things , feeling things, hearing things I am seeing someone at [facility] but I am really unhappy with [facility]". The patient indicated intent to complete all recommendations  .The OPT therapist will continue treatment work with the patient in her next scheduled session.   Plan: Return again in 2/3 weeks.   Diagnosis:      Axis I: Major depressive disorder, recurrent, severe with psychotic features                           Axis II: No diagnosis   I discussed the assessment and treatment plan with the patient. The patient was provided an opportunity to ask questions and all were answered. The patient agreed with the plan and demonstrated an understanding of the instructions.   The patient was advised to call back or seek an in-person evaluation if the symptoms worsen or if the condition fails to improve as anticipated.   I provided 30 minutes of non-face-to-face time during this encounter.   Lennox Grumbles, LCSW   01/17/2021

## 2021-01-26 ENCOUNTER — Ambulatory Visit: Payer: Medicaid Other | Admitting: Internal Medicine

## 2021-01-31 ENCOUNTER — Ambulatory Visit (HOSPITAL_COMMUNITY): Payer: Medicaid Other | Admitting: Clinical

## 2021-01-31 ENCOUNTER — Telehealth (HOSPITAL_COMMUNITY): Payer: Self-pay | Admitting: Clinical

## 2021-01-31 ENCOUNTER — Other Ambulatory Visit: Payer: Self-pay

## 2021-01-31 NOTE — Telephone Encounter (Signed)
The patient did not respond to contact attempts and missed this scheduled session

## 2021-02-16 ENCOUNTER — Other Ambulatory Visit: Payer: Self-pay

## 2021-02-16 ENCOUNTER — Emergency Department (HOSPITAL_COMMUNITY)
Admission: EM | Admit: 2021-02-16 | Discharge: 2021-02-17 | Disposition: A | Discharge status: Other Acute Inpatient Hospital | Payer: Medicaid Other | Source: Home / Self Care | Attending: Emergency Medicine | Admitting: Emergency Medicine

## 2021-02-16 ENCOUNTER — Emergency Department (HOSPITAL_COMMUNITY): Payer: Medicaid Other

## 2021-02-16 ENCOUNTER — Encounter (HOSPITAL_COMMUNITY): Payer: Self-pay

## 2021-02-16 DIAGNOSIS — M79644 Pain in right finger(s): Secondary | ICD-10-CM | POA: Insufficient documentation

## 2021-02-16 DIAGNOSIS — S62637A Displaced fracture of distal phalanx of left little finger, initial encounter for closed fracture: Secondary | ICD-10-CM

## 2021-02-16 DIAGNOSIS — Z85828 Personal history of other malignant neoplasm of skin: Secondary | ICD-10-CM | POA: Insufficient documentation

## 2021-02-16 DIAGNOSIS — R45851 Suicidal ideations: Secondary | ICD-10-CM

## 2021-02-16 DIAGNOSIS — Z85820 Personal history of malignant melanoma of skin: Secondary | ICD-10-CM | POA: Insufficient documentation

## 2021-02-16 DIAGNOSIS — F259 Schizoaffective disorder, unspecified: Secondary | ICD-10-CM | POA: Insufficient documentation

## 2021-02-16 DIAGNOSIS — Z87891 Personal history of nicotine dependence: Secondary | ICD-10-CM | POA: Insufficient documentation

## 2021-02-16 DIAGNOSIS — S0181XA Laceration without foreign body of other part of head, initial encounter: Secondary | ICD-10-CM

## 2021-02-16 DIAGNOSIS — Z20822 Contact with and (suspected) exposure to covid-19: Secondary | ICD-10-CM | POA: Insufficient documentation

## 2021-02-16 DIAGNOSIS — S0083XA Contusion of other part of head, initial encounter: Secondary | ICD-10-CM

## 2021-02-16 DIAGNOSIS — Y900 Blood alcohol level of less than 20 mg/100 ml: Secondary | ICD-10-CM | POA: Insufficient documentation

## 2021-02-16 DIAGNOSIS — S62636A Displaced fracture of distal phalanx of right little finger, initial encounter for closed fracture: Secondary | ICD-10-CM | POA: Insufficient documentation

## 2021-02-16 DIAGNOSIS — S7002XA Contusion of left hip, initial encounter: Secondary | ICD-10-CM | POA: Insufficient documentation

## 2021-02-16 LAB — RAPID URINE DRUG SCREEN, HOSP PERFORMED
Amphetamines: NOT DETECTED
Barbiturates: NOT DETECTED
Benzodiazepines: NOT DETECTED
Cocaine: POSITIVE — AB
Opiates: NOT DETECTED
Tetrahydrocannabinol: NOT DETECTED
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
## 2021-02-16 NOTE — ED Notes (Signed)
When taking this pt. To their room pt. Stated to this nurse that they drank "many beers" today to try and kill themselves. Pt. States they have had thoughts of killing themselves by taking too many pills. Pt. Also stated that they do not feel safe at home and that they do not want to return home tonight. Pt. Stated they afraid to be at their home tonight.

## 2021-02-16 NOTE — ED Notes (Signed)
Patient transported to CT/xray 
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
## 2021-02-16 NOTE — ED Notes (Signed)
Pt has been dressed out in purple scrubs- security at bedside to wand pt.
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
## 2021-02-17 ENCOUNTER — Inpatient Hospital Stay (HOSPITAL_COMMUNITY)
Admission: AD | Admit: 2021-02-17 | Discharge: 2021-02-28 | DRG: 885 | Disposition: A | Discharge status: Home / Self Care | Payer: Medicaid Other | Source: Intra-hospital | Attending: Psychiatry | Admitting: Psychiatry

## 2021-02-17 ENCOUNTER — Encounter (HOSPITAL_COMMUNITY): Payer: Self-pay | Admitting: Nurse Practitioner

## 2021-02-17 ENCOUNTER — Other Ambulatory Visit: Payer: Self-pay | Admitting: Psychiatry

## 2021-02-17 DIAGNOSIS — Z20822 Contact with and (suspected) exposure to covid-19: Secondary | ICD-10-CM | POA: Diagnosis present

## 2021-02-17 DIAGNOSIS — Z79899 Other long term (current) drug therapy: Secondary | ICD-10-CM | POA: Diagnosis not present

## 2021-02-17 DIAGNOSIS — R339 Retention of urine, unspecified: Secondary | ICD-10-CM | POA: Diagnosis present

## 2021-02-17 DIAGNOSIS — S0083XA Contusion of other part of head, initial encounter: Secondary | ICD-10-CM | POA: Diagnosis present

## 2021-02-17 DIAGNOSIS — F332 Major depressive disorder, recurrent severe without psychotic features: Secondary | ICD-10-CM | POA: Diagnosis not present

## 2021-02-17 DIAGNOSIS — M5430 Sciatica, unspecified side: Secondary | ICD-10-CM | POA: Diagnosis present

## 2021-02-17 DIAGNOSIS — Z818 Family history of other mental and behavioral disorders: Secondary | ICD-10-CM

## 2021-02-17 DIAGNOSIS — S62637A Displaced fracture of distal phalanx of left little finger, initial encounter for closed fracture: Secondary | ICD-10-CM | POA: Diagnosis present

## 2021-02-17 DIAGNOSIS — Z87891 Personal history of nicotine dependence: Secondary | ICD-10-CM

## 2021-02-17 DIAGNOSIS — S7002XA Contusion of left hip, initial encounter: Secondary | ICD-10-CM | POA: Diagnosis present

## 2021-02-17 DIAGNOSIS — S0012XA Contusion of left eyelid and periocular area, initial encounter: Secondary | ICD-10-CM | POA: Diagnosis present

## 2021-02-17 DIAGNOSIS — R45851 Suicidal ideations: Secondary | ICD-10-CM | POA: Diagnosis present

## 2021-02-17 DIAGNOSIS — G8929 Other chronic pain: Secondary | ICD-10-CM | POA: Diagnosis present

## 2021-02-17 DIAGNOSIS — R300 Dysuria: Secondary | ICD-10-CM | POA: Diagnosis present

## 2021-02-17 DIAGNOSIS — M25551 Pain in right hip: Secondary | ICD-10-CM | POA: Diagnosis present

## 2021-02-17 DIAGNOSIS — F302 Manic episode, severe with psychotic symptoms: Principal | ICD-10-CM | POA: Diagnosis present

## 2021-02-17 DIAGNOSIS — F1910 Other psychoactive substance abuse, uncomplicated: Secondary | ICD-10-CM | POA: Diagnosis present

## 2021-02-17 DIAGNOSIS — F4310 Post-traumatic stress disorder, unspecified: Secondary | ICD-10-CM | POA: Diagnosis present

## 2021-02-17 DIAGNOSIS — G4700 Insomnia, unspecified: Secondary | ICD-10-CM | POA: Diagnosis present

## 2021-02-17 DIAGNOSIS — F1010 Alcohol abuse, uncomplicated: Secondary | ICD-10-CM | POA: Diagnosis present

## 2021-02-17 DIAGNOSIS — F1420 Cocaine dependence, uncomplicated: Secondary | ICD-10-CM | POA: Diagnosis present

## 2021-02-17 DIAGNOSIS — N760 Acute vaginitis: Secondary | ICD-10-CM | POA: Diagnosis present

## 2021-02-17 DIAGNOSIS — F319 Bipolar disorder, unspecified: Secondary | ICD-10-CM | POA: Diagnosis not present

## 2021-02-17 DIAGNOSIS — F419 Anxiety disorder, unspecified: Secondary | ICD-10-CM | POA: Diagnosis present

## 2021-02-17 LAB — CBC WITH DIFFERENTIAL/PLATELET
Abs Immature Granulocytes: 0.03 10*3/uL (ref 0.00–0.07)
Basophils Absolute: 0 10*3/uL (ref 0.0–0.1)
Basophils Relative: 0 %
Eosinophils Absolute: 0.1 10*3/uL (ref 0.0–0.5)
Eosinophils Relative: 2 %
HCT: 44.1 % (ref 36.0–46.0)
Hemoglobin: 14.3 g/dL (ref 12.0–15.0)
Immature Granulocytes: 1 %
Lymphocytes Relative: 25 %
Lymphs Abs: 1.6 10*3/uL (ref 0.7–4.0)
MCH: 30.8 pg (ref 26.0–34.0)
MCHC: 32.4 g/dL (ref 30.0–36.0)
MCV: 95 fL (ref 80.0–100.0)
Monocytes Absolute: 0.3 10*3/uL (ref 0.1–1.0)
Monocytes Relative: 5 %
Neutro Abs: 4.3 10*3/uL (ref 1.7–7.7)
Neutrophils Relative %: 67 %
Platelets: 181 10*3/uL (ref 150–400)
RBC: 4.64 MIL/uL (ref 3.87–5.11)
RDW: 13.6 % (ref 11.5–15.5)
WBC: 6.4 10*3/uL (ref 4.0–10.5)
nRBC: 0 % (ref 0.0–0.2)

## 2021-02-17 LAB — COMPREHENSIVE METABOLIC PANEL
ALT: 15 U/L (ref 0–44)
AST: 20 U/L (ref 15–41)
Albumin: 4.3 g/dL (ref 3.5–5.0)
Alkaline Phosphatase: 64 U/L (ref 38–126)
Anion gap: 9 (ref 5–15)
BUN: 9 mg/dL (ref 6–20)
CO2: 22 mmol/L (ref 22–32)
Calcium: 8.4 mg/dL — ABNORMAL LOW (ref 8.9–10.3)
Chloride: 106 mmol/L (ref 98–111)
Creatinine, Ser: 0.64 mg/dL (ref 0.44–1.00)
GFR, Estimated: >60 mL/min (ref >60)
Glucose, Bld: 91 mg/dL (ref 70–99)
Potassium: 3.6 mmol/L (ref 3.5–5.1)
Sodium: 137 mmol/L (ref 135–145)
Total Bilirubin: 0.4 mg/dL (ref 0.3–1.2)
Total Protein: 7.5 g/dL (ref 6.5–8.1)

## 2021-02-17 LAB — RESP PANEL BY RT-PCR (FLU A&B, COVID) ARPGX2
Influenza A by PCR: NEGATIVE
Influenza B by PCR: NEGATIVE
SARS Coronavirus 2 by RT PCR: NEGATIVE

## 2021-02-17 LAB — HIV ANTIBODY (ROUTINE TESTING W REFLEX): HIV Screen 4th Generation wRfx: NONREACTIVE

## 2021-02-17 LAB — ETHANOL: Alcohol, Ethyl (B): 17 mg/dL — ABNORMAL HIGH (ref <10)

## 2021-02-17 LAB — RPR: RPR Ser Ql: NONREACTIVE

## 2021-02-17 MED ORDER — FLUOXETINE HCL 20 MG PO CAPS
40.0000 mg | ORAL_CAPSULE | Freq: Every day | ORAL | Status: DC
  Administered 2021-02-17: 40 mg via ORAL
  Filled 2021-02-17: qty 2

## 2021-02-17 MED ORDER — MIRTAZAPINE 7.5 MG PO TABS
7.5000 mg | ORAL_TABLET | Freq: Every day | ORAL | Status: DC
  Administered 2021-02-17: 7.5 mg via ORAL
  Filled 2021-02-17 (×4): qty 1

## 2021-02-17 MED ORDER — ACETAMINOPHEN 325 MG PO TABS: 650.0000 mg | ORAL_TABLET | ORAL | Status: DC | PRN

## 2021-02-17 MED ORDER — THIAMINE HCL 100 MG PO TABS
100.0000 mg | ORAL_TABLET | Freq: Every day | ORAL | Status: DC
  Administered 2021-02-18 – 2021-02-28 (×11): 100 mg via ORAL
  Filled 2021-02-17 (×13): qty 1

## 2021-02-17 MED ORDER — THIAMINE HCL 100 MG PO TABS
100.0000 mg | ORAL_TABLET | Freq: Every day | ORAL | Status: DC
  Administered 2021-02-17: 100 mg via ORAL
  Filled 2021-02-17: qty 1

## 2021-02-17 MED ORDER — LORAZEPAM 1 MG PO TABS: 0.0000 mg | ORAL_TABLET | Freq: Four times a day (QID) | ORAL | Status: DC

## 2021-02-17 MED ORDER — HYDROXYZINE HCL 25 MG PO TABS
25.0000 mg | ORAL_TABLET | Freq: Three times a day (TID) | ORAL | Status: DC | PRN
  Administered 2021-02-17 – 2021-02-18 (×3): 25 mg via ORAL
  Filled 2021-02-17 (×3): qty 1

## 2021-02-17 MED ORDER — LORAZEPAM 2 MG/ML IJ SOLN: 0.0000 mg | Freq: Four times a day (QID) | INTRAMUSCULAR | Status: DC

## 2021-02-17 MED ORDER — OLANZAPINE 7.5 MG PO TABS
15.0000 mg | ORAL_TABLET | Freq: Every day | ORAL | Status: DC
  Administered 2021-02-17: 15 mg via ORAL
  Filled 2021-02-17 (×4): qty 2

## 2021-02-17 MED ORDER — LORAZEPAM 1 MG PO TABS
0.0000 mg | ORAL_TABLET | Freq: Two times a day (BID) | ORAL | Status: DC
Start: 2021-02-19 — End: 2021-02-17

## 2021-02-17 MED ORDER — THIAMINE HCL 100 MG/ML IJ SOLN: 100.0000 mg | Freq: Every day | INTRAMUSCULAR | Status: DC

## 2021-02-17 MED ORDER — NICOTINE 14 MG/24HR TD PT24
14.0000 mg | MEDICATED_PATCH | Freq: Once | TRANSDERMAL | Status: DC
  Administered 2021-02-17: 14 mg via TRANSDERMAL
  Filled 2021-02-17: qty 1

## 2021-02-17 MED ORDER — LORAZEPAM 1 MG PO TABS: 1.0000 mg | ORAL_TABLET | Freq: Four times a day (QID) | ORAL | Status: AC | PRN

## 2021-02-17 MED ORDER — ACETAMINOPHEN 325 MG PO TABS
650.0000 mg | ORAL_TABLET | Freq: Four times a day (QID) | ORAL | Status: DC | PRN
  Administered 2021-02-17 – 2021-02-22 (×10): 650 mg via ORAL
  Filled 2021-02-17 (×10): qty 2

## 2021-02-17 MED ORDER — INFLUENZA VAC SPLIT QUAD 0.5 ML IM SUSY: 0.5000 mL | PREFILLED_SYRINGE | INTRAMUSCULAR | Status: DC

## 2021-02-17 MED ORDER — ACETAMINOPHEN 325 MG PO TABS
650.0000 mg | ORAL_TABLET | Freq: Once | ORAL | Status: AC
  Administered 2021-02-17: 650 mg via ORAL
  Filled 2021-02-17: qty 2

## 2021-02-17 MED ORDER — HYDROXYZINE HCL 25 MG PO TABS: 25.0000 mg | ORAL_TABLET | Freq: Three times a day (TID) | ORAL | Status: DC | PRN

## 2021-02-17 MED ORDER — MAGNESIUM HYDROXIDE 400 MG/5ML PO SUSP: 30.0000 mL | Freq: Every day | ORAL | Status: DC | PRN

## 2021-02-17 MED ORDER — ALUM & MAG HYDROXIDE-SIMETH 200-200-20 MG/5ML PO SUSP: 30.0000 mL | Freq: Four times a day (QID) | ORAL | Status: DC | PRN

## 2021-02-17 MED ORDER — ONDANSETRON HCL 4 MG PO TABS: 4.0000 mg | ORAL_TABLET | Freq: Three times a day (TID) | ORAL | Status: DC | PRN

## 2021-02-17 MED ORDER — PNEUMOCOCCAL VAC POLYVALENT 25 MCG/0.5ML IJ INJ: 0.5000 mL | INJECTION | INTRAMUSCULAR | Status: DC

## 2021-02-17 MED ORDER — ALUM & MAG HYDROXIDE-SIMETH 200-200-20 MG/5ML PO SUSP
30.0000 mL | ORAL | Status: DC | PRN
Start: 2021-02-17 — End: 2021-02-28
  Administered 2021-02-21: 30 mL via ORAL
  Filled 2021-02-17: qty 30

## 2021-02-17 MED ORDER — PRAZOSIN HCL 1 MG PO CAPS
1.0000 mg | ORAL_CAPSULE | Freq: Every day | ORAL | Status: DC
  Administered 2021-02-17 – 2021-02-20 (×4): 1 mg via ORAL
  Filled 2021-02-17 (×6): qty 1

## 2021-02-17 MED ORDER — ZOLPIDEM TARTRATE 5 MG PO TABS: 5.0000 mg | ORAL_TABLET | Freq: Every evening | ORAL | Status: DC | PRN

## 2021-02-17 MED ORDER — PRAZOSIN HCL 1 MG PO CAPS
1.0000 mg | ORAL_CAPSULE | Freq: Every day | ORAL | Status: DC
  Administered 2021-02-17: 1 mg via ORAL
  Filled 2021-02-17 (×5): qty 1

## 2021-02-17 MED ORDER — ONDANSETRON 4 MG PO TBDP
4.0000 mg | ORAL_TABLET | Freq: Four times a day (QID) | ORAL | Status: AC | PRN
Start: 2021-02-17 — End: 2021-02-20

## 2021-02-17 MED ORDER — LORAZEPAM 2 MG/ML IJ SOLN: 0.0000 mg | Freq: Two times a day (BID) | INTRAMUSCULAR | Status: DC

## 2021-02-17 MED ORDER — FLUOXETINE HCL 20 MG PO CAPS
40.0000 mg | ORAL_CAPSULE | Freq: Every day | ORAL | Status: DC
  Administered 2021-02-18: 40 mg via ORAL
  Filled 2021-02-17 (×4): qty 2

## 2021-02-17 MED ORDER — LOPERAMIDE HCL 2 MG PO CAPS: 2.0000 mg | ORAL_CAPSULE | ORAL | Status: AC | PRN

## 2021-02-17 MED ORDER — OLANZAPINE 5 MG PO TABS
15.0000 mg | ORAL_TABLET | Freq: Every day | ORAL | Status: DC
  Administered 2021-02-17: 15 mg via ORAL
  Filled 2021-02-17: qty 3
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
## 2021-02-17 NOTE — ED Notes (Signed)
Attempted to call reports to [facility].  Was informed that they are in a meeting and to try back at [facility]

## 2021-02-17 NOTE — ED Notes (Signed)
Michelle Page at [facility] reports Per T[place], [facility], RN pt has been accepted to [facility] and assigned to room/bed: 400-2. Pt can come after 0930. Attending physician: Dr. Berdine Addison.
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
## 2021-02-17 NOTE — Tx Team (Signed)
Initial Treatment Plan 02/17/2021 5:31 PM Michelle Page BMB:[id number]    PATIENT STRESSORS: Financial difficulties   Marital or family conflict   Medication change or noncompliance   Substance abuse   Traumatic event     PATIENT STRENGTHS: Capable of independent living  Communication skills  Physical Health  Supportive family/friends  Work skills    PATIENT IDENTIFIED PROBLEMS: Alterations in sleep "I have nightmares and can't sleep at night".    Alterations in mood "I'm depressed, I'm sad, I'm not close to to my family because of my drug use".    Financial constraints "I'm not working right now. My ex-husband pays my bills".    Substance abuse "I use cocaine and drink alcohol. Last time I used was yesterday".         DISCHARGE CRITERIA:  Improved stabilization in mood, thinking, and/or behavior Verbal commitment to aftercare and medication compliance  PRELIMINARY DISCHARGE PLAN: Outpatient therapy Return to previous living arrangement  PATIENT/FAMILY INVOLVEMENT: This treatment plan has been presented to and reviewed with the patient, Michelle Page. The patient have been given the opportunity to ask questions and make suggestions.  Keane Police, RN 02/17/2021, 5:31 PM

## 2021-02-17 NOTE — Progress Notes (Addendum)
Adult Psychoeducational Group Note  Date:  02/17/2021 Time:  8:29 PM  Group Topic/Focus:  Wrap-Up Group:   The focus of this group is to help patients review their daily goal of treatment and discuss progress on daily workbooks.  Participation Level:  None  Participation Quality:  Appropriate  Affect:  Appropriate  Cognitive:  Appropriate  Insight: None  Engagement in Group:  Limited  Modes of Intervention:  Discussion  Additional Comments:  Pt attend wrap up group by choose not to participate in the wrap up group.  Candy Sledge 02/17/2021, 8:29 PM

## 2021-02-17 NOTE — ED Notes (Signed)
Pt c/o pain- DR Roxanne Mins made aware- new verbal orders received.
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
## 2021-02-17 NOTE — ED Notes (Signed)
Pt ambulatory to bathroom and back to room escorted by sitter.

## 2021-02-17 NOTE — Progress Notes (Signed)
Pt accepted to [facility] 400-2     Patient meets inpatient criteria per Lindon Romp, FNP    The attending provider will be Magdalen Spatz, MD   Call report to [phone]    Rella Larve, RN @ [facility] ED notified.     Pt scheduled  to arrive at [place].    Mariea Clonts, MSW, LCSW-A  10:35 AM 02/17/2021

## 2021-02-17 NOTE — ED Notes (Signed)
Pt ambulated to restroom at this time also v/s obtained at this time. Michelle Page

## 2021-02-17 NOTE — ED Notes (Signed)
Pt to bathroom and back to room

## 2021-02-17 NOTE — BHH Group Notes (Signed)
Adult Psychoeducational Group Note  Date:  02/17/2021 Time:  5:08 PM  Group Topic/Focus:  Coping With Mental Health Crisis:   The purpose of this group is to help patients identify strategies for coping with mental health crisis.  Group discusses possible causes of crisis and ways to manage them effectively. PT did not attend   Deeann Dowse 02/17/2021, 5:08 PM

## 2021-02-17 NOTE — ED Notes (Signed)
Pt ambulated to restroom at this time without and issues. Michelle Page

## 2021-02-17 NOTE — ED Notes (Signed)
TTS in progress 

## 2021-02-18 DIAGNOSIS — M25551 Pain in right hip: Secondary | ICD-10-CM | POA: Diagnosis present

## 2021-02-18 DIAGNOSIS — S0083XA Contusion of other part of head, initial encounter: Secondary | ICD-10-CM | POA: Diagnosis present

## 2021-02-18 DIAGNOSIS — F332 Major depressive disorder, recurrent severe without psychotic features: Secondary | ICD-10-CM

## 2021-02-18 LAB — HEMOGLOBIN A1C
Hgb A1c MFr Bld: 5 % (ref 4.8–5.6)
Mean Plasma Glucose: 96.8 mg/dL

## 2021-02-18 LAB — LIPID PANEL
Cholesterol: 154 mg/dL (ref 0–200)
HDL: 51 mg/dL (ref >40)
LDL Cholesterol: 82 mg/dL (ref 0–99)
Total CHOL/HDL Ratio: 3 RATIO
Triglycerides: 104 mg/dL (ref <150)
VLDL: 21 mg/dL (ref 0–40)

## 2021-02-18 LAB — TSH: TSH: 4.052 u[IU]/mL (ref 0.350–4.500)

## 2021-02-18 MED ORDER — TOPIRAMATE 25 MG PO TABS
25.0000 mg | ORAL_TABLET | Freq: Two times a day (BID) | ORAL | Status: DC
  Administered 2021-02-18 – 2021-02-21 (×6): 25 mg via ORAL
  Filled 2021-02-18 (×9): qty 1

## 2021-02-18 MED ORDER — LIDOCAINE 5 % EX PTCH
1.0000 | MEDICATED_PATCH | CUTANEOUS | Status: DC
  Administered 2021-02-18 – 2021-02-19 (×2): 1 via TRANSDERMAL
  Filled 2021-02-18 (×5): qty 1

## 2021-02-18 MED ORDER — NICOTINE 14 MG/24HR TD PT24
14.0000 mg | MEDICATED_PATCH | Freq: Every day | TRANSDERMAL | Status: DC
  Administered 2021-02-18 – 2021-02-28 (×11): 14 mg via TRANSDERMAL
  Filled 2021-02-18 (×2): qty 1
  Filled 2021-02-18: qty 30
  Filled 2021-02-18 (×2): qty 1
  Filled 2021-02-18: qty 30
  Filled 2021-02-18 (×9): qty 1

## 2021-02-18 MED ORDER — CHLORPROMAZINE HCL 50 MG PO TABS
25.0000 mg | ORAL_TABLET | Freq: Three times a day (TID) | ORAL | Status: DC | PRN
  Administered 2021-02-19 – 2021-02-27 (×10): 25 mg via ORAL
  Filled 2021-02-18 (×9): qty 1
  Filled 2021-02-18 (×2): qty 30

## 2021-02-18 MED ORDER — CHLORPROMAZINE HCL 25 MG/ML IJ SOLN: 50.0000 mg | Freq: Four times a day (QID) | INTRAMUSCULAR | Status: DC | PRN

## 2021-02-18 MED ORDER — MELATONIN 5 MG PO TABS
5.0000 mg | ORAL_TABLET | Freq: Every day | ORAL | Status: DC
  Administered 2021-02-18 – 2021-02-27 (×10): 5 mg via ORAL
  Filled 2021-02-18: qty 30
  Filled 2021-02-18 (×10): qty 1
  Filled 2021-02-18: qty 30
  Filled 2021-02-18: qty 1

## 2021-02-18 MED ORDER — CHLORPROMAZINE HCL 25 MG PO TABS: 100.0000 mg | ORAL_TABLET | Freq: Four times a day (QID) | ORAL | Status: DC | PRN

## 2021-02-18 MED ORDER — MIRTAZAPINE 7.5 MG PO TABS
7.5000 mg | ORAL_TABLET | Freq: Every evening | ORAL | Status: DC | PRN
  Administered 2021-02-18 – 2021-02-20 (×3): 7.5 mg via ORAL
  Filled 2021-02-18 (×3): qty 1

## 2021-02-18 MED ORDER — MUSCLE RUB 10-15 % EX CREA
TOPICAL_CREAM | CUTANEOUS | Status: DC | PRN
  Administered 2021-02-18: 1 via TOPICAL
  Filled 2021-02-18: qty 85

## 2021-02-18 MED ORDER — CHLORPROMAZINE HCL 50 MG PO TABS
50.0000 mg | ORAL_TABLET | Freq: Every day | ORAL | Status: DC
  Administered 2021-02-18 – 2021-02-20 (×3): 50 mg via ORAL
  Filled 2021-02-18 (×4): qty 1
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
## 2021-02-18 NOTE — Progress Notes (Signed)
Pt told [facility] during group that she is experiencing auditory and visual; hallucinations.  Pt moved to [address] hall and no roommate order requested.   Pt pleased with move, expressed appropriate concern over hallucinations.  02/18/21 2342  Psych Admission Type (Psych Patients Only)  Admission Status Voluntary  Psychosocial Assessment  Patient Complaints Anxiety  Eye Contact Fair  Facial Expression Anxious  Affect Appropriate to circumstance  Speech Logical/coherent  Interaction Assertive  Motor Activity Fidgety  Appearance/Hygiene Improved  Behavior Characteristics Appropriate to situation  Mood Anxious  Thought Process  Coherency Concrete thinking  Content WDL  Delusions None reported or observed  Perception Hallucinations  Hallucination Auditory;Command;Visual  Judgment Impaired  Confusion None  Danger to Self  Current suicidal ideation? Denies  Danger to Others  Danger to Others None reported or observed

## 2021-02-18 NOTE — [facility] Group Notes (Signed)
.  Psychoeducational Group Note    Date:02/18/2021 Time: 1300-1400    Purpose of Group: . The group focus' on teaching patients on how to identify their needs and how Life Skills:  A group where two lists are made. What people need and what are things that we do that are healthy. The lists are developed by the patients and it is explained that we often do the actions that are not healthy to get our list of needs met.  to develop the coping skills needed to get their needs met  Participation Level:  Active  Participation Quality:  Appropriate  Affect:  sad  Cognitive:  Oriented  Insight:  lacking   Engagement in Group:  Engaged  Additional Comments:  Rates her energy level at a 0 or 1/10.  Paulino Rily

## 2021-02-18 NOTE — Progress Notes (Signed)
Adult Psychoeducational Group Note  Date:  02/18/2021 Time:  8:47 PM  Group Topic/Focus:  Wrap-Up Group:   The focus of this group is to help patients review their daily goal of treatment and discuss progress on daily workbooks.  Participation Level:  Minimal  Participation Quality:  Appropriate  Affect:  Anxious, Depressed, and Irritable  Cognitive:  Disorganized and Confused  Insight: Lacking and Limited  Engagement in Group:  Limited and Poor  Modes of Intervention:  Discussion  Additional Comments:  Pt stated her goal for today was to focus on her treatment plan. Pt stated she accomplished her goal today. Pt stated she talked with her doctor and her social worker about her care today. Pt rated her overall day a 5 out of 10.Pt stated she has slept most of the day. Pt stated the medicine is making her very tired. Pt stated she was able to contact her daughter today which improved her overall day. Pt stated she felt better about herself tonight. Pt stated she was able to attend all groups held today. Pt stated she was able to attend all meals. Pt stated she took all medications provided today. Pt stated her appetite was fair. Pt rated her sleep last night was fair. Pt stated the goal tonight was to get some rest. Pt stated she had some physical pain today. Pt stated she had some pain all over her body tonight. Pt rated the body pain a 5 on the pain level scale. Pt nurse was updated on the situation. Pt admitted to dealing with visual hallucinations and auditory issues tonight.  Pt nurse was updated on situation. Pt denies thoughts of harming herself or others. Pt stated she would alert staff if anything changed.  Candy Sledge 02/18/2021, 8:47 PM

## 2021-02-18 NOTE — [facility] Group Notes (Signed)
.  Psychoeducational Group Note  Date: 02/18/2021 Time: 0900-1000    Goal Setting   Purpose of Group: This group helps to provide patients with the steps of setting a goal that is specific, measurable, attainable, realistic and time specific. A discussion on how we keep ourselves stuck with negative self talk.    Participation Level:  Did not attend   Paulino Rily

## 2021-02-18 NOTE — Group Note (Signed)
LCSW Group Therapy Note  No therapy group could be held this day due to staffing issues.  Another licensed group was held.  Selmer Dominion, LCSW 02/18/2021 11:26 AM

## 2021-02-18 NOTE — Progress Notes (Addendum)
   02/18/21 1100  Psych Admission Type (Psych Patients Only)  Admission Status Voluntary  Psychosocial Assessment  Patient Complaints Depression;Anxiety  Eye Contact Fair  Facial Expression Anxious;Worried  Affect Appropriate to circumstance  Catering manager Activity Fidgety  Appearance/Hygiene In scrubs  Behavior Characteristics Cooperative;Calm  Mood Depressed;Anxious  Aggressive Behavior  Targets Self  Type of Behavior Other (Comment) ("I overdosed on some coke and alcohol")  Effect No apparent injury  Thought Process  Coherency Circumstantial  Content Blaming self;Blaming others  Delusions None reported or observed  Perception WDL  Hallucination None reported or observed  Judgment Impaired  Confusion None  Danger to Self  Current suicidal ideation? Denies  Danger to Others  Danger to Others None reported or observed   D. Pt presents with and anxious affect/ mood- calm, cooperative behavior- Pt rated her depression, hopelessness a 7/10 and 8/10, respectively. Pt continues to endorse right hip pain, 7/10 Pt currently denies SI/HI and AVH   A. Labs and vitals monitored. Pt given prn Tylenol and muscle rub for hip pain. Pt supported emotionally and encouraged to express concerns and ask questions.   R. Pt remains safe with 15 minute checks. Will continue POC.
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
## 2021-02-18 NOTE — BHH Suicide Risk Assessment (Signed)
[facility] INPATIENT:  Family/Significant Other Suicide Prevention Education  Suicide Prevention Education:  Patient Refusal for Family/Significant Other Suicide Prevention Education: The patient Michelle Page has refused to provide written consent for family/significant other to be provided Family/Significant Other Suicide Prevention Education during admission and/or prior to discharge.  Physician notified.  Berlin Hun Grossman-Orr 02/18/2021, 12:20 PM

## 2021-02-18 NOTE — Progress Notes (Signed)
Pt did not attend Orientation group.

## 2021-02-19 DIAGNOSIS — F332 Major depressive disorder, recurrent severe without psychotic features: Secondary | ICD-10-CM | POA: Diagnosis not present

## 2021-02-19 LAB — MAGNESIUM: Magnesium: 1.9 mg/dL (ref 1.7–2.4)

## 2021-02-19 LAB — HEPATITIS C ANTIBODY: HCV Ab: NONREACTIVE

## 2021-02-19 LAB — AMMONIA: Ammonia: 29 umol/L (ref 9–35)
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
## 2021-02-19 NOTE — Progress Notes (Signed)
Progress note  Pt found in bed; compliant with medication administration. Pt expresses worrying,anxiety, and hopelessness. Pt is worried with going back to their current situation and struggling financially. Pt did have complaints of R hip pain with medication provided. Pt is pleasant and has been seen in the dayroom with peers. Pt denies si/hi/ah/vh and verbally agrees to approach staff if these become apparent or before harming themselves/others while at [facility].  A: Pt provided support and encouragement. Pt given medication per protocol and standing orders. Q32m safety checks implemented and continued.  R: Pt safe on the unit. Will continue to monitor.

## 2021-02-19 NOTE — Progress Notes (Signed)
Adult Psychoeducational Group Note  Date:  02/19/2021 Time:  8:28 PM  Group Topic/Focus:  Wrap-Up Group:   The focus of this group is to help patients review their daily goal of treatment and discuss progress on daily workbooks.  Participation Level:  Active  Participation Quality:  Appropriate  Affect:  Appropriate  Cognitive:  Appropriate  Insight: Appropriate  Engagement in Group:  Engaged  Modes of Intervention:  Discussion  Additional Comments:  patient said one positive thing that happen today her medication working.  Lenice Llamas Long 02/19/2021, 8:28 PM

## 2021-02-19 NOTE — [facility] Group Notes (Signed)
Adult Psychoeducational Group Not Date:  02/19/2021 Time:  0900-1045 Group Topic/Focus: PROGRESSIVE RELAXATION. A group where deep breathing is taught and tensing and relaxation muscle groups is used. Imagery is used as well.  Pts are asked to imagine 3 pillars that hold them up when they are not able to hold themselves up.  Participation Level:  came in and left the group   Bryson Dames A

## 2021-02-19 NOTE — Progress Notes (Signed)
   02/19/21 2333  Psych Admission Type (Psych Patients Only)  Admission Status Voluntary  Psychosocial Assessment  Patient Complaints Anxiety  Eye Contact Fair  Facial Expression Anxious;Sullen;Sad;Worried  Affect Anxious;Depressed;Sad;Sullen  Catering manager Activity Restless  Appearance/Hygiene Unremarkable  Behavior Characteristics Cooperative;Anxious  Mood Pleasant  Thought Process  Coherency Concrete thinking  Content Blaming others  Delusions None reported or observed  Perception Hallucinations  Hallucination Auditory;Command;Visual  Judgment Poor  Confusion None  Danger to Self  Current suicidal ideation? Denies  Danger to Others  Danger to Others None reported or observed

## 2021-02-19 NOTE — Progress Notes (Signed)
Pt didn't attend orientation/goals group. 

## 2021-02-19 NOTE — Group Note (Signed)
Group Topic: Crisis Management  Group Date: 02/19/2021 Start Time: 1600 End Time: 1630 Facilitators: Baron Sane, RN  Department: [facility]  Number of Participants: 5  Group Focus: activities of daily living skills, communication, concentration, nursing group, and self-awareness Treatment Modality:  Interpersonal Therapy, Psychoeducation, and Solution-Focused Therapy Interventions utilized were exploration, patient education, and support Purpose: enhance coping skills, express feelings, improve communication skills, and increase insight  Name: Michelle Page Date of Birth: [birth year]  MR: [id number]    Level of Participation: minimal Quality of Participation: attentive Interactions with others: gave feedback Mood/Affect: appropriate Triggers (if applicable):  Cognition: coherent/clear Progress: Moderate Response:  Plan: changes to treatment plan  Patients Problems:  Patient Active Problem List   Diagnosis Date Noted   Facial bruising 02/18/2021   Chronic hip pain, right 02/18/2021   Severe recurrent major depression ([place]) 02/17/2021   Moderate cocaine use disorder ([place]) 12/15/2020   Substance abuse ([place]) 12/15/2020   Alcohol abuse 12/15/2020   MDD (major depressive disorder), recurrent, severe, with psychosis ([place]) 12/14/2020   Headache disorder 08/16/2020   Anxiety disorder 08/16/2020   Annual physical exam 07/21/2020   Intermittent lightheadedness 07/21/2020   Schizoaffective disorder ([place]) 06/09/2020   Post traumatic stress disorder 06/09/2020   Heart murmur 01/30/2019   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   Obesity (BMI 30-39.9) 01/27/2019   Vitamin D deficiency disease 01/27/2019   Encounter for gynecological examination with Papanicolaou smear of cervix 11/26/2018   Hemorrhoids 11/26/2018   ASCUS of cervix with negative high risk HPV 01/29/2017   Bipolar I disorder, single manic episode, severe, with psychosis ([place])  09/12/2011

## 2021-02-19 NOTE — Plan of Care (Signed)
  Problem: Education: Goal: Knowledge of [facility] Education information/materials will improve Outcome: Progressing Goal: Emotional status will improve Outcome: Progressing Goal: Mental status will improve Outcome: Progressing Goal: Verbalization of understanding the information provided will improve Outcome: Progressing   

## 2021-02-19 NOTE — Progress Notes (Signed)
Pt didn't attend therapeutic relaxation group.

## 2021-02-20 ENCOUNTER — Encounter (HOSPITAL_COMMUNITY): Payer: Self-pay

## 2021-02-20 DIAGNOSIS — F319 Bipolar disorder, unspecified: Secondary | ICD-10-CM

## 2021-02-20 LAB — RPR: RPR Ser Ql: NONREACTIVE

## 2021-02-20 MED ORDER — LIDOCAINE 5 % EX PTCH
1.0000 | MEDICATED_PATCH | Freq: Every day | CUTANEOUS | Status: DC
  Administered 2021-02-20 – 2021-02-21 (×2): 1 via TRANSDERMAL
  Filled 2021-02-20 (×8): qty 1

## 2021-02-20 NOTE — Progress Notes (Signed)
Recreation Therapy Notes  INPATIENT RECREATION THERAPY ASSESSMENT  Patient Details Name: Nohelani KAMBRYN DAPOLITO MRN: [id number] DOB: 01-Oct-1975 Today's Date: 02/20/2021       Information Obtained From: Patient  Able to Participate in Assessment/Interview: Yes  Patient Presentation: Anxious  Reason for Admission (Per Patient): Suicidal Ideation  Patient Stressors: Other (Comment) (Life)  Coping Skills:   TV, Music, Exercise, Deep Breathing, Substance Abuse, Talk, Hot Bath/Shower  Leisure Interests (2+):  Exercise - Walking  Frequency of Recreation/Participation: Other (Comment) (Daily)  Awareness of Community Resources:  Yes  Community Resources:  Restaurants, [employer], Engineer, drilling  Current Use: No  If no, Barriers?: Transportation  Expressed Interest in [place]: No  [employer] of Residence:  Acupuncturist  Patient Main Form of Transportation: Diplomatic Services operational officer  Patient Strengths:  "Other people's problems (helping them)"  Patient Identified Areas of Improvement:  Anxiety, Helpless, Can't sit stil  Patient Goal for Hospitalization:  "get into another program so I don't go home alone and drink"  Current SI (including self-harm):  No  Current HI:  No  Current AVH: No  Staff Intervention Plan: Group Attendance, Collaborate with Interdisciplinary Treatment Team  Consent to Intern Participation: N/A    Victorino Sparrow, LRT/CTRS Victorino Sparrow A 02/20/2021, 12:44 PM

## 2021-02-20 NOTE — Progress Notes (Signed)
Adult Psychoeducational Group Note  Date:  02/20/2021 Time:  10:10 PM  Group Topic/Focus:  Wrap-Up Group:   The focus of this group is to help patients review their daily goal of treatment and discuss progress on daily workbooks.  Participation Level:  Active  Participation Quality:  Appropriate  Affect:  Appropriate  Cognitive:  Appropriate  Insight: Appropriate  Engagement in Group:  Improving  Modes of Intervention:  Discussion  Additional Comments:  Pt stated her goal for today was to focus on her treatment plan and talk with her doctor about a medication issue. Pt stated she accomplished her goals today. Pt stated she talked with her doctor and her social worker about her care today. Pt stated the doctor started her on some medication that has help her to relax. Pt stated she felt much better tonight about her self.  Pt rated her overall day a 6 out of 10. Pt stated she was able to contact her daughter today which improved her overall day. Pt stated she felt better about herself tonight. Pt stated she was able to attend all groups held today. Pt stated she was able to attend all meals. Pt stated she took all medications provided today. Pt stated her appetite was fair. Pt rated her sleep last night was fair. Pt stated the goal tonight was to get some rest. Pt stated she had some physical pain today. Pt stated she had some pain in right hand and her rip hip. Pt rated the pain in her right hand and right hip a 7 on the pain level scale. Pt nurse was updated on the situation. Pt admitted to dealing with visual hallucinations and auditory issues tonight.  Pt nurse was updated on situation. Pt denies thoughts of harming herself or others. Pt stated she would alert staff if anything changed.  Candy Sledge 02/20/2021, 10:10 PM

## 2021-02-20 NOTE — Group Note (Signed)
Recreation Therapy Group Note   Group Topic:Coping Skills  Group Date: 02/20/2021 Start Time: 1000 End Time: 1040 Facilitators: Victorino Sparrow, LRT/CTRS Location: 500 Hall Dayroom  Goal Area(s) Addresses:  Pt will be able to identify positive coping skills. Pt will be able to identify benefits of using positive coping skills. Pt will be able to identify how these coping skills can be used post d/c.  Group Description: Mind Map.  LRT and patients came up with 8 (anxiety, anger, depression, self care, communication, me time, medication and relationships) instances in which coping skills would be used.  Patients were then given time to try to come up with at least three coping sills for each situation.  When patients finished, LRT wrote the coping skills on the board.  Anyone that had any blank spaces on their page could fill them in with coping skills written on the board.   Affect/Mood: Appropriate   Participation Level: Engaged   Participation Quality: Independent   Behavior: Appropriate   Speech/Thought Process: Focused   Insight: Good   Judgement: Good   Modes of Intervention: Guided Discussion   Patient Response to Interventions:  Engaged   Education Outcome:  Acknowledges education and In group clarification offered    Clinical Observations/Individualized Feedback: Pt was attentive and social with peers at times.  Pt identified some of her coping skills as medication, going outside, brushing teeth, slow down, pets and don't forget to take medication.  Pt was standing the majority of group.  Pt would as express when she didn't coping skills for certain topics. Pt was bright throughout group.    Plan: Continue to engage patient in RT group sessions 2-3x/week.   Victorino Sparrow, LRT/CTRS 02/20/2021 11:46 AM

## 2021-02-20 NOTE — Plan of Care (Signed)
  Problem: Activity: Goal: Interest or engagement in activities will improve Outcome: Progressing Goal: Sleeping patterns will improve Outcome: Progressing   Problem: Coping: Goal: Ability to verbalize frustrations and anger appropriately will improve Outcome: Progressing

## 2021-02-20 NOTE — Group Note (Signed)
LCSW Group Therapy Note   Group Date: 02/20/2021 Start Time: 1300 End Time: 1400   Type of Therapy and Topic:  Group Therapy: Boundaries  Participation Level:  Minimal  Description of Group: This group will address the use of boundaries in their personal lives. Patients will explore why boundaries are important, the difference between healthy and unhealthy boundaries, and negative and postive outcomes of different boundaries and will look at how boundaries can be crossed.  Patients will be encouraged to identify current boundaries in their own lives and identify what kind of boundary is being set. Facilitators will guide patients in utilizing problem-solving interventions to address and correct types boundaries being used and to address when no boundary is being used. Understanding and applying boundaries will be explored and addressed for obtaining and maintaining a balanced life. Patients will be encouraged to explore ways to assertively make their boundaries and needs known to significant others in their lives, using other group members and facilitator for role play, support, and feedback.  Therapeutic Goals:  1.  Patient will identify areas in their life where setting clear boundaries could be  used to improve their life.  2.  Patient will identify signs/triggers that a boundary is not being respected. 3.  Patient will identify two ways to set boundaries in order to achieve balance in  their lives: 4.  Patient will demonstrate ability to communicate their needs and set boundaries  through discussion and/or role plays  Summary of Patient Progress:  Michelle Page was present at the beginning of the session however left halfway through.  Patient demonstrated developing insight into the subject matter, was respectful of peers.  Therapeutic Modalities:   Cognitive Behavioral Therapy Solution-Focused Therapy  Vassie Moselle, LCSW 02/20/2021  1:57 PM

## 2021-02-20 NOTE — Progress Notes (Signed)
Progress note    02/20/21 0805  Psych Admission Type (Psych Patients Only)  Admission Status Voluntary  Psychosocial Assessment  Patient Complaints Anxiety  Eye Contact Fair  Facial Expression Anxious  Affect Anxious;Appropriate to circumstance  Speech Logical/coherent  Interaction Assertive  Motor Activity Slow  Appearance/Hygiene Unremarkable  Behavior Characteristics Cooperative;Appropriate to situation;Anxious  Mood Anxious;Pleasant  Thought Process  Coherency Concrete thinking  Content Blaming others  Delusions None reported or observed  Perception Hallucinations  Hallucination Auditory  Judgment Poor  Confusion None  Danger to Self  Current suicidal ideation? Denies  Danger to Others  Danger to Others None reported or observed
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
## 2021-02-20 NOTE — Progress Notes (Signed)
Pt didn't attend orientation/goals group. 

## 2021-02-20 NOTE — BH IP Treatment Plan (Signed)
Interdisciplinary Treatment and Diagnostic Plan Update  02/20/2021 Time of Session: 2:20pm Michelle Page MRN: [id number]  Principal Diagnosis: Bipolar I disorder, single manic episode, severe, with psychosis ([place])  Secondary Diagnoses: Principal Problem:   Bipolar I disorder, single manic episode, severe, with psychosis ([place]) Active Problems:   Post traumatic stress disorder   Moderate cocaine use disorder ([place])   Substance abuse ([place])   Alcohol abuse   Severe recurrent major depression ([place])   Anxiety disorder   Facial bruising   Chronic hip pain, right   Current Medications:  Current Facility-Administered Medications  Medication Dose Route Frequency Provider Last Rate Last Admin   acetaminophen (TYLENOL) tablet 650 mg  650 mg Oral Q6H PRN Rozetta Nunnery, NP   650 mg at 02/19/21 2022   alum & mag hydroxide-simeth (MAALOX/MYLANTA) 200-200-20 MG/5ML suspension 30 mL  30 mL Oral Q4H PRN Rozetta Nunnery, NP       chlorproMAZINE (THORAZINE) tablet 100 mg  100 mg Oral QID PRN Hill, Jackie Plum, MD       Or   chlorproMAZINE (THORAZINE) injection 50 mg  50 mg Intramuscular QID PRN Hill, Jackie Plum, MD       chlorproMAZINE (THORAZINE) tablet 25 mg  25 mg Oral TID PRN Maida Sale, MD   25 mg at 02/20/21 1157   chlorproMAZINE (THORAZINE) tablet 50 mg  50 mg Oral QHS Hill, Jackie Plum, MD   50 mg at 02/19/21 2020   influenza vac split quadrivalent PF (FLUARIX) injection 0.5 mL  0.5 mL Intramuscular Tomorrow-1000 Bobbitt, Shalon E, NP       lidocaine (LIDODERM) 5 % 1 patch  1 patch Transdermal Daily Hill, Jackie Plum, MD   1 patch at 02/20/21 1255   magnesium hydroxide (MILK OF MAGNESIA) suspension 30 mL  30 mL Oral Daily PRN Lindon Romp A, NP       melatonin tablet 5 mg  5 mg Oral QHS Hill, Jackie Plum, MD   5 mg at 02/19/21 2020   mirtazapine (REMERON) tablet 7.5 mg  7.5 mg Oral QHS PRN Maida Sale, MD   7.5 mg at 02/19/21 2021   Muscle Rub CREA    Topical PRN Maida Sale, MD   1 application at 24/40/10 1418   nicotine (NICODERM CQ - dosed in mg/24 hours) patch 14 mg  14 mg Transdermal Daily Bobbitt, Shalon E, NP   14 mg at 02/20/21 0804   pneumococcal 23 valent vaccine (PNEUMOVAX-23) injection 0.5 mL  0.5 mL Intramuscular Tomorrow-1000 Bobbitt, Shalon E, NP       prazosin (MINIPRESS) capsule 1 mg  1 mg Oral QHS Bobbitt, Shalon E, NP   1 mg at 02/19/21 2020   thiamine tablet 100 mg  100 mg Oral Daily Lindon Romp A, NP   100 mg at 02/20/21 0804   topiramate (TOPAMAX) tablet 25 mg  25 mg Oral BID Maida Sale, MD   25 mg at 02/20/21 0804   PTA Medications: Medications Prior to Admission  Medication Sig Dispense Refill Last Dose   zolpidem (AMBIEN) 5 MG tablet Take 5 mg by mouth at bedtime as needed for sleep.      FLUoxetine (PROZAC) 40 MG capsule Take 1 capsule (40 mg total) by mouth daily. For depression 30 capsule 0    hydrOXYzine (ATARAX/VISTARIL) 25 MG tablet Take 1 tablet (25 mg total) by mouth 3 (three) times daily as needed for anxiety. 75 tablet 0    OLANZapine (ZYPREXA)  15 MG tablet Take 1 tablet (15 mg total) by mouth at bedtime. For mood control 30 tablet 0    prazosin (MINIPRESS) 1 MG capsule Take 1 capsule (1 mg total) by mouth at bedtime. For nightmare 30 capsule 0     Patient Stressors: Financial difficulties   Marital or family conflict   Medication change or noncompliance   Substance abuse   Traumatic event    Patient Strengths: Capable of independent living  Communication skills  Physical Health  Supportive family/friends  Work skills   Treatment Modalities: Medication Management, Group therapy, Case management,  1 to 1 session with clinician, Psychoeducation, Recreational therapy.   Physician Treatment Plan for Primary Diagnosis: Bipolar I disorder, single manic episode, severe, with psychosis ([place]) Long Term Goal(s): Improvement in symptoms so as ready for discharge   Short Term  Goals: Ability to identify changes in lifestyle to reduce recurrence of condition will improve Ability to verbalize feelings will improve Ability to disclose and discuss suicidal ideas Ability to demonstrate self-control will improve Ability to identify and develop effective coping behaviors will improve Ability to maintain clinical measurements within normal limits will improve Compliance with prescribed medications will improve Ability to identify triggers associated with substance abuse/mental health issues will improve  Medication Management: Evaluate patient's response, side effects, and tolerance of medication regimen.  Therapeutic Interventions: 1 to 1 sessions, Unit Group sessions and Medication administration.  Evaluation of Outcomes: Not Met  Physician Treatment Plan for Secondary Diagnosis: Principal Problem:   Bipolar I disorder, single manic episode, severe, with psychosis ([place]) Active Problems:   Post traumatic stress disorder   Moderate cocaine use disorder ([place])   Substance abuse ([place])   Alcohol abuse   Severe recurrent major depression ([place])   Anxiety disorder   Facial bruising   Chronic hip pain, right  Long Term Goal(s): Improvement in symptoms so as ready for discharge   Short Term Goals: Ability to identify changes in lifestyle to reduce recurrence of condition will improve Ability to verbalize feelings will improve Ability to disclose and discuss suicidal ideas Ability to demonstrate self-control will improve Ability to identify and develop effective coping behaviors will improve Ability to maintain clinical measurements within normal limits will improve Compliance with prescribed medications will improve Ability to identify triggers associated with substance abuse/mental health issues will improve     Medication Management: Evaluate patient's response, side effects, and tolerance of medication regimen.  Therapeutic Interventions: 1 to 1 sessions, Unit  Group sessions and Medication administration.  Evaluation of Outcomes: Not Met   RN Treatment Plan for Primary Diagnosis: Bipolar I disorder, single manic episode, severe, with psychosis ([place]) Long Term Goal(s): Knowledge of disease and therapeutic regimen to maintain health will improve  Short Term Goals: Ability to remain free from injury will improve, Ability to verbalize frustration and anger appropriately will improve, Ability to demonstrate self-control, Ability to identify and develop effective coping behaviors will improve, and Compliance with prescribed medications will improve  Medication Management: RN will administer medications as ordered by provider, will assess and evaluate patient's response and provide education to patient for prescribed medication. RN will report any adverse and/or side effects to prescribing provider.  Therapeutic Interventions: 1 on 1 counseling sessions, Psychoeducation, Medication administration, Evaluate responses to treatment, Monitor vital signs and CBGs as ordered, Perform/monitor CIWA, COWS, AIMS and Fall Risk screenings as ordered, Perform wound care treatments as ordered.  Evaluation of Outcomes: Not Met   LCSW Treatment Plan for Primary Diagnosis: Bipolar  I disorder, single manic episode, severe, with psychosis ([place]) Long Term Goal(s): Safe transition to appropriate next level of care at discharge, Engage patient in therapeutic group addressing interpersonal concerns.  Short Term Goals: Engage patient in aftercare planning with referrals and resources, Increase social support, Increase ability to appropriately verbalize feelings, Increase emotional regulation, Identify triggers associated with mental health/substance abuse issues, and Increase skills for wellness and recovery  Therapeutic Interventions: Assess for all discharge needs, 1 to 1 time with Social worker, Explore available resources and support systems, Assess for adequacy in community  support network, Educate family and significant other(s) on suicide prevention, Complete Psychosocial Assessment, Interpersonal group therapy.  Evaluation of Outcomes: Not Met   Progress in Treatment: Attending groups: Yes. Participating in groups: Yes. Taking medication as prescribed: Yes. Toleration medication: Yes. Family/Significant other contact made: No, will contact:  declined consents  Patient understands diagnosis: No. Discussing patient identified problems/goals with staff: Yes. Medical problems stabilized or resolved: Yes. Denies suicidal/homicidal ideation: Yes. Issues/concerns per patient self-inventory: No.   New problem(s) identified: No, Describe:  none  New Short Term/Long Term Goal(s): detox, medication management for mood stabilization; elimination of SI thoughts; development of comprehensive mental wellness/sobriety plan  Patient Goals:  "To go into another treatment and to be stable on medicine"  Discharge Plan or Barriers: Pt is interested in rehab following hospitalization.   Reason for Continuation of Hospitalization: Anxiety Depression Medication stabilization Suicidal ideation  Estimated Length of Stay: 3-5 days   Scribe for Treatment Team: Vassie Moselle, LCSW 02/20/2021 3:05 PM

## 2021-02-21 LAB — URINALYSIS, ROUTINE W REFLEX MICROSCOPIC
Bilirubin Urine: NEGATIVE
Glucose, UA: NEGATIVE mg/dL
Hgb urine dipstick: NEGATIVE
Ketones, ur: NEGATIVE mg/dL
Leukocytes,Ua: NEGATIVE
Nitrite: NEGATIVE
Protein, ur: NEGATIVE mg/dL
Specific Gravity, Urine: 1.004 — ABNORMAL LOW (ref 1.005–1.030)
pH: 6 (ref 5.0–8.0)

## 2021-02-21 MED ORDER — CHLORPROMAZINE HCL 25 MG PO TABS
25.0000 mg | ORAL_TABLET | Freq: Every morning | ORAL | Status: DC
  Administered 2021-02-21 – 2021-02-22 (×2): 25 mg via ORAL
  Filled 2021-02-21 (×4): qty 1

## 2021-02-21 MED ORDER — TOPIRAMATE 25 MG PO TABS
25.0000 mg | ORAL_TABLET | Freq: Two times a day (BID) | ORAL | Status: DC
  Administered 2021-02-21 – 2021-02-28 (×14): 25 mg via ORAL
  Filled 2021-02-21 (×5): qty 1
  Filled 2021-02-21: qty 60
  Filled 2021-02-21 (×8): qty 1
  Filled 2021-02-21 (×2): qty 60
  Filled 2021-02-21 (×2): qty 1
  Filled 2021-02-21: qty 60
  Filled 2021-02-21: qty 1

## 2021-02-21 MED ORDER — CHLORPROMAZINE HCL 25 MG PO TABS
125.0000 mg | ORAL_TABLET | Freq: Every day | ORAL | Status: DC
  Administered 2021-02-21: 125 mg via ORAL
  Filled 2021-02-21 (×2): qty 1

## 2021-02-21 MED ORDER — PRAZOSIN HCL 2 MG PO CAPS
2.0000 mg | ORAL_CAPSULE | Freq: Every day | ORAL | Status: AC
  Administered 2021-02-21: 2 mg via ORAL
  Filled 2021-02-21: qty 1

## 2021-02-21 MED ORDER — MIRTAZAPINE 7.5 MG PO TABS
7.5000 mg | ORAL_TABLET | Freq: Every day | ORAL | Status: DC
  Administered 2021-02-21 – 2021-02-22 (×2): 7.5 mg via ORAL
  Filled 2021-02-21 (×4): qty 1

## 2021-02-21 MED ORDER — TOPIRAMATE 25 MG PO TABS: 50.0000 mg | ORAL_TABLET | Freq: Two times a day (BID) | ORAL | Status: DC

## 2021-02-21 MED ORDER — METRONIDAZOLE 500 MG PO TABS
500.0000 mg | ORAL_TABLET | Freq: Two times a day (BID) | ORAL | Status: AC
  Administered 2021-02-21 – 2021-02-27 (×14): 500 mg via ORAL
  Filled 2021-02-21 (×7): qty 1
  Filled 2021-02-21: qty 2
  Filled 2021-02-21 (×7): qty 1

## 2021-02-21 MED ORDER — CHLORPROMAZINE HCL 100 MG PO TABS
100.0000 mg | ORAL_TABLET | Freq: Every day | ORAL | Status: DC
  Filled 2021-02-21 (×2): qty 1

## 2021-02-21 NOTE — [facility] Group Notes (Signed)
[name] Group Notes:  (Nursing/MHT/Case Management/Adjunct)  Date:  02/21/2021  Time:  2:20 PM  Type of Therapy:   Orientation/Goals group  Participation Level:  Active  Participation Quality:  Appropriate  Affect:  Appropriate  Cognitive:  Appropriate  Insight:  Appropriate  Engagement in Group:  Engaged  Modes of Intervention:  Discussion and Orientation  Summary of Progress/Problems: Pt goal for today is to find a dual diagnosis rehab for her to be discharged to.   Michelle Page J Treyana Sturgell 02/21/2021, 2:20 PM

## 2021-02-21 NOTE — Progress Notes (Signed)
Adult Psychoeducational Group Note  Date:  02/21/2021 Time:  8:28 PM  Group Topic/Focus:  Wrap-Up Group:   The focus of this group is to help patients review their daily goal of treatment and discuss progress on daily workbooks.  Participation Level:  Minimal  Participation Quality:  Appropriate  Affect:  Anxious and Appropriate  Cognitive:  Appropriate  Insight: Appropriate  Engagement in Group:  Limited  Modes of Intervention:  Discussion  Additional Comments:  Pt stated her goal for today was to focus on her treatment plan and talk with her doctor about a medication issue. Pt stated she accomplished her goals today. Pt stated she talked with her doctor and her social worker about her care today. Pt stated the doctor started her on some medication that has help her to relax. Pt stated she felt much better tonight about her self.  Pt rated her overall day a 5 out of 10. Pt stated she was able to contact her daughter today which improved her overall day. Pt stated she felt better about herself tonight. Pt stated she was able to attend all groups held today. Pt stated she was able to attend all meals. Pt stated she took all medications provided today. Pt stated her appetite was fair. Pt rated her sleep last night was poor. Pt stated the goal tonight was to get some rest. Pt stated she had some physical pain today. Pt stated she had some pain in right hand. Pt rated the pain in her right hand  a 5 on the pain level scale. Pt nurse was updated on the situation. Pt deny dealing with visual hallucinations or auditory issues tonight.  Pt denies thoughts of harming herself or others. Pt stated she would alert staff if anything changed.  Candy Sledge 02/21/2021, 8:28 PM

## 2021-02-21 NOTE — Group Note (Signed)
Recreation Therapy Group Note   Group Topic:Leisure Education  Group Date: 02/21/2021 Start Time: 1010 End Time: 1100 Facilitators: Victorino Sparrow, LRT/CTRS Location: [address] Dayroom  Goal Area(s) Addresses:  Patient will successfully identify benefits of leisure participation. Patient will successfully identify ways to access leisure activities. Patient will identify leisure activities available based on their geographical location in proximity to their home.   Group Description:  Leisure PSA. Patients picked a leisure activity from a cup. They were to make a PSA about that activity they chose.  Patients were to identify the benefits, location, equipment needed and when the activity could be done.  Patients were provided with magazines, glue sticks, safety scissors and construction paper to complete the activity.   Affect/Mood: Appropriate   Participation Level: Engaged   Participation Quality: Independent   Behavior: Appropriate   Speech/Thought Process: Focused   Insight: Good   Judgement: Good   Modes of Intervention: Art   Patient Response to Interventions:  Engaged   Education Outcome:  Acknowledges education and In group clarification offered    Clinical Observations/Individualized Feedback: Pt was to complete a PSA on exercise.  Pt highlighted the need to have goals when exercising.  Pt also showed exercise was a part of wellness.  Pt showed a woman sitting on a mat preparing to meditate and another woman stretching.  Pt also put words together to create the slogan "imagine my life strong and well".    Plan: Continue to engage patient in RT group sessions 2-3x/week.   Victorino Sparrow, LRT/CTRS  02/21/2021 12:15 PM

## 2021-02-21 NOTE — Progress Notes (Incomplete)
Pt rates her anxiety, depression and hopelessness all 7/10 with current stressor being going to a rehab facility. Per pt "I know I'm doing the right thing, I requested to go but now I'm just so anxious doing what's right for me. I'm also worried about my bird not being taken care of as well at home since I'm here". Reports she slept fairly last night due to nightmares "I'm still having that at night, I hope I can sleep better tonight because I kept getting up last night".  Pt received PRN Tylenol for c/o of right hip pain, Mylanta for c/o heartburn and Thorazine for anxiety (see [place]). Reported relief when reassessed. Denies SI,HI, VH and pain. Endorsed +AH "Only when I get really anxious". Visible in dayroom majority of this shift for scheduled groups. Tolerated all meals and medications well without discomfort. Pt was started on Flagyl for mal odorous vaginal discharge and itching.  Q 15 minutes safety

## 2021-02-21 NOTE — BHH Counselor (Signed)
CSW spoke with patient who informed CSW that [facility] will not have bed availability until the third week in November. Pt is interested in additional options for treatment.    Darletta Moll MSW, LCSW Clincal Social Worker  [facility]
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
## 2021-02-21 NOTE — BHH Group Notes (Signed)
[facility] Group Notes:  (Nursing/MHT/Case Management/Adjunct)  Date:  02/21/2021  Time:  6:35 PM  Type of Therapy:  Psychoeducational Skills  Participation Level:  Active  Participation Quality:  Appropriate  Affect:  Appropriate  Cognitive:  Appropriate  Insight:  Appropriate  Engagement in Group:  Engaged  Modes of Intervention:  Discussion, Education, and Support  Summary of Progress/Problems: The topic was mindfulness. It was discussed how it is a way of paying attention to the present moment, using techniques like meditation, breathing and yoga. How it helps [place] become more aware of our thoughts and feelings so that, instead of being overwhelmed by them, we're better able to manage them.  Faustina Gebert J Allyn Bertoni 02/21/2021, 6:35 PM
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
## 2021-02-22 MED ORDER — CHLORPROMAZINE HCL 50 MG PO TABS
150.0000 mg | ORAL_TABLET | Freq: Every day | ORAL | Status: DC
  Administered 2021-02-22 – 2021-02-23 (×2): 150 mg via ORAL
  Filled 2021-02-22 (×3): qty 1

## 2021-02-22 MED ORDER — CHLORPROMAZINE HCL 25 MG PO TABS
25.0000 mg | ORAL_TABLET | Freq: Two times a day (BID) | ORAL | Status: DC
  Administered 2021-02-22 – 2021-02-23 (×2): 25 mg via ORAL
  Filled 2021-02-22 (×4): qty 1

## 2021-02-22 NOTE — BHH Group Notes (Signed)
Adult Psychoeducational Group Note  Date:  02/22/2021 Time:  9:20 PM  Group Topic/Focus:  Making Healthy Choices:   The focus of this group is to help patients identify negative/unhealthy choices they were using prior to admission and identify positive/healthier coping strategies to replace them upon discharge.  Participation Level:  Active  Participation Quality:  Attentive  Affect:  Appropriate  Cognitive:  Appropriate  Insight: Good  Engagement in Group:  Engaged  Modes of Intervention:  Discussion  Additional Comments  Dalene Carrow 02/22/2021, 9:20 PM
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
## 2021-02-22 NOTE — Progress Notes (Signed)
   02/21/21 1955  Psych Admission Type (Psych Patients Only)  Admission Status Voluntary  Psychosocial Assessment  Patient Complaints Anxiety;Worrying  Eye Contact Fair  Facial Expression Anxious  Affect Anxious  Speech Logical/coherent  Interaction Assertive  Motor Activity Slow  Appearance/Hygiene Unremarkable  Behavior Characteristics Cooperative;Appropriate to situation;Anxious  Mood Anxious;Preoccupied (preoccupied with discharge plan)  Thought Process  Coherency Concrete thinking  Content WDL  Delusions None reported or observed  Perception Hallucinations  Hallucination None reported or observed  Judgment Poor  Confusion None  Danger to Self  Current suicidal ideation? Denies  Danger to Others  Danger to Others None reported or observed   Pt seen on the unit. Pt denies SI, HI, AVH and pain. Says she had discomfort in her right hip but doesn't want any medication for it tonight. Pt rates anxiety 10/10 d/t discharge. Pt wants to be discharged Thursday to participate in a program at [facility] but is also apprehensive about it as well. "I wanna be discharged for this program. It's 28 days. But, they may think I'm not ready to leave. I'm okay either way but I want to know if I can go or if I have to stay. The not knowing is making me anxious." Pt advised to speak with provider in the morning and ask if she can be discharged Thursday. Also advised to speak with SW about bed availability to be sure there is one. Pt agreeable to this plan. Pt medication dosages increased d/t complaints of nightmares and anxiety.

## 2021-02-22 NOTE — Group Note (Signed)
LCSW Group Therapy Note  Group Date: 02/22/2021 Start Time: 1300 End Time: 1400   Type of Therapy and Topic:  Group Therapy: Positive Affirmations  Participation Level:  Active   Description of Group:   This group addressed positive affirmation towards self and others.  Patients went around the room and identified two positive things about themselves and two positive things about a peer in the room.  Patients reflected on how it felt to share something positive with others, to identify positive things about themselves, and to hear positive things from others/ Patients were encouraged to have a daily reflection of positive characteristics or circumstances.   Therapeutic Goals: Patients will verbalize two of their positive qualities Patients will demonstrate empathy for others by stating two positive qualities about a peer in the group Patients will verbalize their feelings when voicing positive self affirmations and when voicing positive affirmations of others Patients will discuss the potential positive impact on their wellness/recovery of focusing on positive traits of self and others.  Summary of Patient Progress:  Michelle Page actively engaged in the discussion and shared appropriately. She was able to identify positive affirmations about herself as well as other group members. Patient demonstrated developing/improving insight into the subject matter, was respectful of peers, participated throughout the entire session.  Therapeutic Modalities:   Cognitive Behavioral Therapy Motivational Interviewing    Vassie Moselle, LCSW 02/22/2021  2:32 PM

## 2021-02-22 NOTE — B[facility] Group Notes (Signed)
Adult Psychoeducational Group Note  Date:  02/22/2021 Time:  9:51 AM  Group Topic/Focus:  Goals Group:   The focus of this group is to help patients establish daily goals to achieve during treatment and discuss how the patient can incorporate goal setting into their daily lives to aide in recovery.  Participation Level:  Active  Participation Quality:  Appropriate  Affect:  Appropriate  Cognitive:  Appropriate  Insight: Good  Engagement in Group:  Engaged  Modes of Intervention:  Clarification and Discussion  Additional Comments:  pt discussed going to a treatment center but not having clothes and her goal is to talk with the social worker about aftercare.    Dub Mikes 02/22/2021, 9:51 AM

## 2021-02-22 NOTE — [facility] Group Notes (Signed)
[facility] Group Notes:  (Nursing/MHT/Case Management/Adjunct)  Date:  02/22/2021  Time:  5:19 PM  Type of Therapy:   Therapeutic Relaxation group  Participation Level:  Active  Participation Quality:  Appropriate  Affect:  Appropriate  Cognitive:  Appropriate  Insight:  Appropriate  Engagement in Group:  Engaged  Modes of Intervention:  Activity, Discussion, and Exploration  Summary of Progress/Problems: Group consisted of some deep breathing techniques along with positive visualization exercises. These things help with stress and anxiety and works well with coping skills.  Dameon Soltis J Natoshia Souter 02/22/2021, 5:19 PM

## 2021-02-22 NOTE — Group Note (Signed)
Recreation Therapy Group Note   Group Topic:Problem Solving  Group Date: 02/22/2021 Start Time: 1000 End Time: 1572 Facilitators: Victorino Sparrow, LRT/CTRS Location: [address] Dayroom   Goal Area(s) Addresses:  Patient will effectively work with peer towards shared goal.  Patient will identify skills used to make activity successful.  Patient will identify how skills used during activity can be used to reach post d/c goals.    Group Description: Each patient was given a poly spot plus one extra for the whole group and LRT used cones to create an obstacle course for patients to maneuver through.  Patients were to use the poly spots to get the group from one end of the hall to the other.  If anyone were to step off of their spot, the group had to start over. LRT and patients debriefed on the importance of patience, communication, paying attention, asking peers for help, and problem solving   Affect/Mood: Appropriate   Participation Level: Moderate   Participation Quality: Independent   Behavior: Attentive    Speech/Thought Process: Relevant   Insight: Good   Judgement: Good   Modes of Intervention: Group work   Patient Response to Interventions:  Attentive   Education Outcome:  Acknowledges education and In group clarification offered    Clinical Observations/Individualized Feedback: Pt joined in during debriefing.  Pt stated it took patience and strategy complete activity.  Pt expressed when dealing with support system, you have to step back and breath before interacting.  Pt also expressed having to execute the plan when one is created with the support system.    Plan: Continue to engage patient in RT group sessions 2-3x/week.   Victorino Sparrow, LRT/CTRS 02/22/2021 1:05 PM

## 2021-02-23 MED ORDER — CHLORPROMAZINE HCL 50 MG PO TABS
50.0000 mg | ORAL_TABLET | Freq: Every morning | ORAL | Status: DC
  Administered 2021-02-24 – 2021-02-25 (×2): 50 mg via ORAL
  Filled 2021-02-23 (×4): qty 1

## 2021-02-23 MED ORDER — CHLORPROMAZINE HCL 50 MG PO TABS
50.0000 mg | ORAL_TABLET | Freq: Every day | ORAL | Status: DC
  Administered 2021-02-23 – 2021-02-25 (×3): 50 mg via ORAL
  Filled 2021-02-23 (×4): qty 1

## 2021-02-23 MED ORDER — MIRTAZAPINE 15 MG PO TABS
15.0000 mg | ORAL_TABLET | Freq: Every day | ORAL | Status: DC
  Administered 2021-02-23 – 2021-02-27 (×5): 15 mg via ORAL
  Filled 2021-02-23 (×3): qty 1
  Filled 2021-02-23: qty 30
  Filled 2021-02-23 (×4): qty 1
  Filled 2021-02-23: qty 30

## 2021-02-23 MED ORDER — CHLORPROMAZINE HCL 25 MG PO TABS
25.0000 mg | ORAL_TABLET | Freq: Every morning | ORAL | Status: DC
  Filled 2021-02-23 (×2): qty 1

## 2021-02-23 MED ORDER — PRAZOSIN HCL 2 MG PO CAPS
2.0000 mg | ORAL_CAPSULE | Freq: Every day | ORAL | Status: DC
  Administered 2021-02-23 – 2021-02-27 (×5): 2 mg via ORAL
  Filled 2021-02-23: qty 2
  Filled 2021-02-23 (×2): qty 1
  Filled 2021-02-23: qty 30
  Filled 2021-02-23 (×4): qty 1
  Filled 2021-02-23: qty 30
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
## 2021-02-23 NOTE — Progress Notes (Signed)
   02/23/21 1950  Psych Admission Type (Psych Patients Only)  Admission Status Voluntary  Psychosocial Assessment  Patient Complaints Anxiety;Sleep disturbance  Eye Contact Intense  Facial Expression Anxious;Pensive;Worried  Affect Anxious;Apprehensive  Speech Rapid;Pressured  Interaction Assertive  Motor Activity Restless;Hand-wringing;Pacing  Appearance/Hygiene Unremarkable  Behavior Characteristics Cooperative;Anxious;Fidgety;Pacing;Restless  Mood Anxious;Preoccupied  Thought Process  Coherency Concrete thinking  Content WDL  Delusions Paranoid (pt is tired and says she didn't sleep well and has been delusional)  Perception Hallucinations  Hallucination Auditory (pt says she has been delusional. Didn't specify what she was seeing or hearing)  Judgment Poor  Confusion None  Danger to Self  Current suicidal ideation? Denies  Danger to Others  Danger to Others None reported or observed   Pt seen pacing in the hallway. Pt denies SI, HI, and pain. Pt anxious, fidgety and restless. "My blood pressure was low last night so I didn't get my med. I haven't slept. I've been delusional all day. I just want to sleep." Pt endorses some stomach issues but thinks it's the food. Pt LBM - 02/23/21. Pt endorses AVH but doesn't say what she has experienced. Rates depression 10/10 and denies depression. Pt is anxious in part d/t her ex-husband wanting custody of their kids. "It's his new wife. He knows he can see the kids anytime he wants without any problem. We're both adults here. Now, he feels he has to have some agreement on paper." Pt states that the doctors didn't feel she was ready to be discharged yet. "I think there is a bed opening up [facility] (at [facility]). Hopefully they'll think I'm ready to go then."

## 2021-02-23 NOTE — Progress Notes (Signed)
The patient's positive event for the day is that the staff located housing for her. Her goal for tomorrow is to "eat less".                       '

## 2021-02-23 NOTE — Progress Notes (Signed)
Progress note    02/23/21 0800  Psych Admission Type (Psych Patients Only)  Admission Status Voluntary  Psychosocial Assessment  Patient Complaints Anxiety;Worrying  Eye Contact Fair  Facial Expression Anxious;Pensive;Worried  Affect Anxious  Catering manager Activity Restless  Appearance/Hygiene Unremarkable  Behavior Characteristics Cooperative;Appropriate to situation;Anxious;Restless  Mood Anxious;Pleasant  Thought Process  Coherency Concrete thinking  Content WDL  Delusions None reported or observed  Perception WDL  Hallucination None reported or observed  Judgment Poor  Confusion None  Danger to Self  Current suicidal ideation? Denies  Danger to Others  Danger to Others None reported or observed

## 2021-02-23 NOTE — [facility] Group Notes (Signed)
Adult Psychoeducational Group Note  Date:  02/23/2021 Time:  4:31 PM  Group Topic/Focus:  Wellness Toolbox:   The focus of this group is to discuss various aspects of wellness, balancing those aspects and exploring ways to increase the ability to experience wellness.  Patients will create a wellness toolbox for use upon discharge.  Participation Level:  Active  Participation Quality:  Appropriate  Affect:  Appropriate  Cognitive:  Appropriate  Insight: Appropriate  Engagement in Group:  Engaged  Modes of Intervention:  Discussion  Additional Comments:    Dub Mikes 02/23/2021, 4:31 PM

## 2021-02-23 NOTE — Plan of Care (Signed)
  Problem: Physical Regulation: Goal: Ability to maintain clinical measurements within normal limits will improve Outcome: Progressing   Problem: Safety: Goal: Periods of time without injury will increase Outcome: Progressing   Problem: Education: Goal: Knowledge of disease or condition will improve Outcome: Progressing

## 2021-02-23 NOTE — Group Note (Signed)
Occupational Therapy Group Note  Group Topic:Coping Skills  Group Date: 02/23/2021 Start Time: 1400 End Time: 1440 Facilitators: Ponciano Ort, OT/L   Group Description: Group encouraged increased engagement and participation through discussion and activity focused on "Coping Ahead." Patients were split up into teams and selected a card from a stack of positive coping strategies. Patients were instructed to act out/charade the coping skill for other peers to guess and receive points for their team. Discussion followed with a focus on identifying additional positive coping strategies and patients shared how they were going to cope ahead over the weekend while continuing hospitalization stay.  Therapeutic Goal(s): Identify positive vs negative coping strategies. Identify coping skills to be used during hospitalization vs coping skills outside of hospital/at home Increase participation in therapeutic group environment and promote engagement in treatment   Participation Level: Active   Participation Quality: Independent   Behavior: Calm and Cooperative   Speech/Thought Process: Directed   Affect/Mood: Constricted   Insight: Limited   Judgement: Limited   Individualization: Michelle Page was active in their participation of group discussion/activity. Pt identified benefit of music as a coping strategy "It sets me free" and shared that she likes to listen to music with her children because it puts her in a good mood. Pt left group a few minutes early, reporting pain in her hip from "sitting to long" and asked to walk laps in the hallway.   Modes of Intervention: Activity, Discussion, and Education  Patient Response to Interventions:  Attentive, Disengaged, and Engaged   Plan: Continue to engage patient in OT groups 2 - 3x/week.  02/23/2021  Ponciano Ort, OT/L

## 2021-02-23 NOTE — Progress Notes (Signed)
   02/22/21 2030  Psych Admission Type (Psych Patients Only)  Admission Status Voluntary  Psychosocial Assessment  Patient Complaints Anxiety;Worrying  Eye Contact Fair  Facial Expression Anxious;Pensive  Affect Anxious  Speech Logical/coherent  Interaction Assertive  Motor Activity Slow  Appearance/Hygiene In scrubs  Behavior Characteristics Cooperative;Appropriate to situation  Mood Anxious;Preoccupied  Thought Process  Coherency Concrete thinking  Content WDL  Delusions None reported or observed  Perception WDL  Hallucination None reported or observed  Judgment Poor  Confusion None  Danger to Self  Current suicidal ideation? Denies  Danger to Others  Danger to Others None reported or observed

## 2021-02-23 NOTE — [facility] Group Notes (Signed)
Adult Psychoeducational Group Note  Date:  02/23/2021 Time:  8:58 AM  Group Topic/Focus:  Goals Group:   The focus of this group is to help patients establish daily goals to achieve during treatment and discuss how the patient can incorporate goal setting into their daily lives to aide in recovery.  Participation Level:  Active  Participation Quality:  Appropriate  Affect:  Appropriate  Cognitive:  Appropriate  Insight: Appropriate  Engagement in Group:  Engaged  Modes of Intervention:  Discussion  Additional Comments:  Pt Goal: Anxiety Sleep. Pt said that she got no sleep last night.  Dub Mikes 02/23/2021, 8:58 AM

## 2021-02-23 NOTE — Group Note (Signed)
Recreation Therapy Group Note   Group Topic:Personal Development  Group Date: 02/23/2021 Start Time: 1884 End Time: 1023 Facilitators: Victorino Sparrow, LRT/CTRS Location: [address] Dayroom   Goal Area(s) Addresses:  Patient will successfully identify triggers. Patient will successfully identify ways to avoid triggers. Patient will successfully identify benefits of knowing triggers.    Group Description:  Triggers.  Patients were given a worksheet where they were to identify three things that triggered them the most.  Patients then identified ways to avoid dealing with those triggers and lastly ways to deal with triggers head on when they can't be avoided.  LRT and patients then discussed the impact triggers can have on overall well being.   Affect/Mood: Appropriate   Participation Level: Engaged   Participation Quality: Independent   Behavior: Appropriate   Speech/Thought Process: Focused   Insight: Good   Judgement: Good   Modes of Intervention: Worksheet   Patient Response to Interventions:  Engaged   Education Outcome:  Acknowledges education and In group clarification offered    Clinical Observations/Individualized Feedback: Pt expressed to the group not realizing she had triggers until the came here.  Pt talked about her mother being a trigger for her.  Pt dealt with it by moving away from her mother.  As a result of that, pt stated she has successful for the last 20 yrs.  Pt was also concern that her mother knew she was here and expressed no interest in talking to her.    Plan: Continue to engage patient in RT group sessions 2-3x/week.   Victorino Sparrow, LRT/CTRS 02/23/2021 11:15 AM

## 2021-02-24 ENCOUNTER — Encounter (HOSPITAL_COMMUNITY): Payer: Self-pay

## 2021-02-24 MED ORDER — DIVALPROEX SODIUM ER 250 MG PO TB24
750.0000 mg | ORAL_TABLET | Freq: Every day | ORAL | Status: DC
  Administered 2021-02-24 – 2021-02-27 (×4): 750 mg via ORAL
  Filled 2021-02-24 (×2): qty 3
  Filled 2021-02-24: qty 90
  Filled 2021-02-24: qty 3
  Filled 2021-02-24: qty 90
  Filled 2021-02-24 (×2): qty 3

## 2021-02-24 MED ORDER — CHLORPROMAZINE HCL 100 MG PO TABS
200.0000 mg | ORAL_TABLET | Freq: Every day | ORAL | Status: DC
  Administered 2021-02-24 – 2021-02-27 (×4): 200 mg via ORAL
  Filled 2021-02-24: qty 60
  Filled 2021-02-24: qty 8
  Filled 2021-02-24: qty 2
  Filled 2021-02-24: qty 60
  Filled 2021-02-24 (×4): qty 2

## 2021-02-24 NOTE — Progress Notes (Signed)
Pt did not have VS assessed this morning. Pt finally asleep after wakefulness throughout the night.
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
## 2021-02-24 NOTE — [facility] Group Notes (Signed)
Adult Psychoeducational Group Note  Date:  02/24/2021 Time:  9:05 PM  Group Topic/Focus:  Goals Group:   The focus of this group is to help patients establish daily goals to achieve during treatment and discuss how the patient can incorporate goal setting into their daily lives to aide in recovery.  Participation Level:  Active  Participation Quality:  Appropriate  Affect:  Appropriate  Cognitive:  Alert  Insight: Good  Engagement in Group:  Engaged  Modes of Intervention:  Discussion  Additional Comments  Dalene Carrow 02/24/2021, 9:05 PM

## 2021-02-24 NOTE — Progress Notes (Signed)
Pt didn't attend orientation/goals group. 

## 2021-02-24 NOTE — Progress Notes (Signed)
Pt pacing the hallway. "I just can't sleep." Pt asked to give the medication until 2200 to begin working before any PRNs given. Pt agreed.

## 2021-02-24 NOTE — Group Note (Deleted)
LCSW Group Therapy Note   Group Date: 02/24/2021 Start Time: 1100 End Time: 1200   Type of Therapy and Topic:  Group Therapy:   Participation Level:  {[facility] PARTICIPATION WLSLH:[id number]}  Description of Group:   Therapeutic Goals:  1.     Summary of Patient Progress:    ***  Therapeutic Modalities:   Zachery Conch, LCSWA 02/24/2021  11:58 AM

## 2021-02-24 NOTE — BH IP Treatment Plan (Signed)
Interdisciplinary Treatment and Diagnostic Plan Update  02/24/2021 Time of Session:  Michelle Page MRN: [id number]  Principal Diagnosis: Bipolar I disorder, single manic episode, severe, with psychosis ([place])  Secondary Diagnoses: Principal Problem:   Bipolar I disorder, single manic episode, severe, with psychosis ([place]) Active Problems:   Post traumatic stress disorder   Moderate cocaine use disorder ([place])   Substance abuse ([place])   Alcohol abuse   Severe recurrent major depression ([place])   Anxiety disorder   Facial bruising   Chronic hip pain, right   Current Medications:  Current Facility-Administered Medications  Medication Dose Route Frequency Provider Last Rate Last Admin   acetaminophen (TYLENOL) tablet 650 mg  650 mg Oral Q6H PRN Lindon Romp A, NP   650 mg at 02/22/21 1836   alum & mag hydroxide-simeth (MAALOX/MYLANTA) 200-200-20 MG/5ML suspension 30 mL  30 mL Oral Q4H PRN Lindon Romp A, NP   30 mL at 02/21/21 1316   chlorproMAZINE (THORAZINE) tablet 100 mg  100 mg Oral QID PRN Maida Sale, MD       Or   chlorproMAZINE (THORAZINE) injection 50 mg  50 mg Intramuscular QID PRN Hill, Jackie Plum, MD       chlorproMAZINE (THORAZINE) tablet 150 mg  150 mg Oral QHS Rosezetta Schlatter, MD   150 mg at 02/23/21 2043   chlorproMAZINE (THORAZINE) tablet 25 mg  25 mg Oral TID PRN Maida Sale, MD   25 mg at 02/24/21 0302   chlorproMAZINE (THORAZINE) tablet 50 mg  50 mg Oral Daily Rosezetta Schlatter, MD   50 mg at 02/23/21 1257   chlorproMAZINE (THORAZINE) tablet 50 mg  50 mg Oral q AM Massengill, Ovid Curd, MD   50 mg at 02/24/21 0811   lidocaine (LIDODERM) 5 % 1 patch  1 patch Transdermal Daily Maida Sale, MD   1 patch at 02/21/21 0827   magnesium hydroxide (MILK OF MAGNESIA) suspension 30 mL  30 mL Oral Daily PRN Lindon Romp A, NP       melatonin tablet 5 mg  5 mg Oral QHS Hill, Jackie Plum, MD   5 mg at 02/23/21 2043   metroNIDAZOLE (FLAGYL) tablet  500 mg  500 mg Oral Q12H Rosezetta Schlatter, MD   500 mg at 02/24/21 0810   mirtazapine (REMERON) tablet 15 mg  15 mg Oral QHS Massengill, Ovid Curd, MD   15 mg at 02/23/21 2043   Muscle Rub CREA   Topical PRN Maida Sale, MD   1 application at 19/50/93 1418   nicotine (NICODERM CQ - dosed in mg/24 hours) patch 14 mg  14 mg Transdermal Daily Bobbitt, Shalon E, NP   14 mg at 02/24/21 2671   prazosin (MINIPRESS) capsule 2 mg  2 mg Oral QHS Rosezetta Schlatter, MD   2 mg at 02/23/21 2043   thiamine tablet 100 mg  100 mg Oral Daily Lindon Romp A, NP   100 mg at 02/24/21 2458   topiramate (TOPAMAX) tablet 25 mg  25 mg Oral BID Rosezetta Schlatter, MD   25 mg at 02/24/21 0810   PTA Medications: Medications Prior to Admission  Medication Sig Dispense Refill Last Dose   zolpidem (AMBIEN) 5 MG tablet Take 5 mg by mouth at bedtime as needed for sleep.      FLUoxetine (PROZAC) 40 MG capsule Take 1 capsule (40 mg total) by mouth daily. For depression 30 capsule 0    hydrOXYzine (ATARAX/VISTARIL) 25 MG tablet Take 1 tablet (25 mg total)  by mouth 3 (three) times daily as needed for anxiety. 75 tablet 0    OLANZapine (ZYPREXA) 15 MG tablet Take 1 tablet (15 mg total) by mouth at bedtime. For mood control 30 tablet 0    prazosin (MINIPRESS) 1 MG capsule Take 1 capsule (1 mg total) by mouth at bedtime. For nightmare 30 capsule 0     Patient Stressors: Financial difficulties   Marital or family conflict   Medication change or noncompliance   Substance abuse   Traumatic event    Patient Strengths: Capable of independent living  Communication skills  Physical Health  Supportive family/friends  Work skills   Treatment Modalities: Medication Management, Group therapy, Case management,  1 to 1 session with clinician, Psychoeducation, Recreational therapy.   Physician Treatment Plan for Primary Diagnosis: Bipolar I disorder, single manic episode, severe, with psychosis ([place]) Long Term Goal(s): Improvement in  symptoms so as ready for discharge   Short Term Goals: Ability to identify changes in lifestyle to reduce recurrence of condition will improve Ability to verbalize feelings will improve Ability to disclose and discuss suicidal ideas Ability to demonstrate self-control will improve Ability to identify and develop effective coping behaviors will improve Ability to maintain clinical measurements within normal limits will improve Compliance with prescribed medications will improve Ability to identify triggers associated with substance abuse/mental health issues will improve  Medication Management: Evaluate patient's response, side effects, and tolerance of medication regimen.  Therapeutic Interventions: 1 to 1 sessions, Unit Group sessions and Medication administration.  Evaluation of Outcomes: Progressing  Physician Treatment Plan for Secondary Diagnosis: Principal Problem:   Bipolar I disorder, single manic episode, severe, with psychosis ([place]) Active Problems:   Post traumatic stress disorder   Moderate cocaine use disorder ([place])   Substance abuse ([place])   Alcohol abuse   Severe recurrent major depression ([place])   Anxiety disorder   Facial bruising   Chronic hip pain, right  Long Term Goal(s): Improvement in symptoms so as ready for discharge   Short Term Goals: Ability to identify changes in lifestyle to reduce recurrence of condition will improve Ability to verbalize feelings will improve Ability to disclose and discuss suicidal ideas Ability to demonstrate self-control will improve Ability to identify and develop effective coping behaviors will improve Ability to maintain clinical measurements within normal limits will improve Compliance with prescribed medications will improve Ability to identify triggers associated with substance abuse/mental health issues will improve     Medication Management: Evaluate patient's response, side effects, and tolerance of medication  regimen.  Therapeutic Interventions: 1 to 1 sessions, Unit Group sessions and Medication administration.  Evaluation of Outcomes: Progressing   RN Treatment Plan for Primary Diagnosis: Bipolar I disorder, single manic episode, severe, with psychosis ([place]) Long Term Goal(s): Knowledge of disease and therapeutic regimen to maintain health will improve  Short Term Goals: Ability to participate in decision making will improve, Ability to verbalize feelings will improve, and Ability to identify and develop effective coping behaviors will improve  Medication Management: RN will administer medications as ordered by provider, will assess and evaluate patient's response and provide education to patient for prescribed medication. RN will report any adverse and/or side effects to prescribing provider.  Therapeutic Interventions: 1 on 1 counseling sessions, Psychoeducation, Medication administration, Evaluate responses to treatment, Monitor vital signs and CBGs as ordered, Perform/monitor CIWA, COWS, AIMS and Fall Risk screenings as ordered, Perform wound care treatments as ordered.  Evaluation of Outcomes: Progressing   LCSW Treatment Plan for Primary  Diagnosis: Bipolar I disorder, single manic episode, severe, with psychosis ([place]) Long Term Goal(s): Safe transition to appropriate next level of care at discharge, Engage patient in therapeutic group addressing interpersonal concerns.  Short Term Goals: Engage patient in aftercare planning with referrals and resources, Increase social support, and Increase ability to appropriately verbalize feelings  Therapeutic Interventions: Assess for all discharge needs, 1 to 1 time with Social worker, Explore available resources and support systems, Assess for adequacy in community support network, Educate family and significant other(s) on suicide prevention, Complete Psychosocial Assessment, Interpersonal group therapy.  Evaluation of Outcomes:  Progressing   Progress in Treatment: Attending groups: Yes. Participating in groups: Yes. Taking medication as prescribed: Yes. Toleration medication: Yes. Family/Significant other contact made: Yes, individual(s) contacted:  Pt declined Patient understands diagnosis: Yes. Discussing patient identified problems/goals with staff: Yes. Medical problems stabilized or resolved: Yes. Denies suicidal/homicidal ideation: Yes. Issues/concerns per patient self-inventory: No. Other: None  New problem(s) identified: No, Describe:  None  New Short Term/Long Term Goal(s):medication stabilization, elimination of SI thoughts, development of comprehensive mental wellness plan.   Patient Goals:  "to go into another treatment and to be stable on medicine."   Discharge Plan or Barriers: Residential treatment options are currently being explored.   Reason for Continuation of Hospitalization: Medication stabilization  Estimated Length of Stay: 3-5 days   Scribe for Treatment Team: Eliott Nine 02/24/2021 11:08 AM

## 2021-02-24 NOTE — Group Note (Signed)
LCSW Group Therapy Note  Group Date: 02/24/2021 Start Time: 1100 End Time: 1200   Type of Therapy and Topic:  Group Therapy - Healthy vs Unhealthy Coping Skills  Participation Level:  Active   Description of Group The focus of this group was to determine what unhealthy coping techniques typically are used by group members and what healthy coping techniques would be helpful in coping with various problems. Patients were guided in becoming aware of the differences between healthy and unhealthy coping techniques. Patients were asked to identify 2-3 healthy coping skills they would like to learn to use more effectively.  Therapeutic Goals Patients learned that coping is what human beings do all day long to deal with various situations in their lives Patients defined and discussed healthy vs unhealthy coping techniques Patients identified their preferred coping techniques and identified whether these were healthy or unhealthy Patients determined 2-3 healthy coping skills they would like to become more familiar with and use more often. Patients provided support and ideas to each other   Summary of Patient Progress:  Patient participated in group appropriately in group.  Patient chose to not participate in the muscle progression activity due to bringing up additional trauma. Patient engaged in conversation about relaxation techniques and discussed how stress can affect the body.    Therapeutic Modalities Cognitive Behavioral Therapy Motivational Interviewing  Zachery Conch, [place] 02/24/2021  12:16 PM

## 2021-02-24 NOTE — [facility] Group Notes (Signed)
Spirituality group facilitated by Kathrynn Humble, [place].   Group Description: Group focused on topic of hope. Patients participated in facilitated discussion around topic, connecting with one another around experiences and definitions for hope. Group members engaged with visual explorer photos, reflecting on what hope looks like for them today. Group engaged in discussion around how their definitions of hope are present today in hospital.   Modalities: Psycho-social ed, Adlerian, Narrative, MI   Patient Progress: Michelle Page was present at the beginning of group, but said she was feeling tired and needed a nap.  [place], Bcc Pager, [phone] 5:25 PM

## 2021-02-24 NOTE — Progress Notes (Signed)
Pt has been up and down all night saying that she is tossing and turning. Pt refused any PRNs about 4 hours ago. Pt willing to take something now. Given PRNs per [facility].

## 2021-02-24 NOTE — [facility] Group Notes (Signed)
[facility] Group Notes:  (Nursing/MHT/Case Management/Adjunct)  Date:  02/24/2021  Time:  6:31 PM  Type of Therapy:   Therapeutic Relaxation group  Participation Level:  Active  Participation Quality:  Appropriate  Affect:  Appropriate  Cognitive:  Appropriate  Insight:  Appropriate  Engagement in Group:  Engaged  Modes of Intervention:  Activity, Exploration, and Support  Summary of Progress/Problems: Group consisted of some deep breathing techniques along with positive visualization exercises. These things help with stress and anxiety and works well with coping skills  Thereasa Distance 02/24/2021, 6:31 PM

## 2021-02-24 NOTE — Progress Notes (Signed)
Pt didn't attend psycho-ed group.

## 2021-02-24 NOTE — Plan of Care (Signed)
  Problem: Health Behavior/Discharge Planning: Goal: Identification of resources available to assist in meeting health care needs will improve Outcome: Progressing Goal: Compliance with treatment plan for underlying cause of condition will improve Outcome: Progressing

## 2021-02-24 NOTE — Progress Notes (Signed)
Progress note    02/24/21 0811  Psych Admission Type (Psych Patients Only)  Admission Status Voluntary  Psychosocial Assessment  Patient Complaints Anxiety  Eye Contact Fair  Facial Expression Animated;Anxious  Affect Anxious;Appropriate to circumstance  Speech Logical/coherent  Interaction Assertive  Motor Activity Pacing;Restless  Appearance/Hygiene Unremarkable  Behavior Characteristics Cooperative;Appropriate to situation;Anxious  Mood Anxious;Pleasant  Thought Process  Coherency Concrete thinking  Content WDL  Delusions None reported or observed  Perception WDL  Hallucination None reported or observed  Judgment Poor  Confusion None  Danger to Self  Current suicidal ideation? Denies  Danger to Others  Danger to Others None reported or observed

## 2021-02-25 DIAGNOSIS — F302 Manic episode, severe with psychotic symptoms: Principal | ICD-10-CM

## 2021-02-25 MED ORDER — CHLORPROMAZINE HCL 50 MG PO TABS
50.0000 mg | ORAL_TABLET | Freq: Every day | ORAL | Status: DC
  Administered 2021-02-26 – 2021-02-27 (×2): 50 mg via ORAL
  Filled 2021-02-25: qty 2
  Filled 2021-02-25 (×3): qty 1
  Filled 2021-02-25: qty 2
  Filled 2021-02-25: qty 1

## 2021-02-25 MED ORDER — LIDOCAINE 5 % EX PTCH: 1.0000 | MEDICATED_PATCH | Freq: Every day | CUTANEOUS | Status: DC | PRN

## 2021-02-25 MED ORDER — CHLORPROMAZINE HCL 25 MG PO TABS
50.0000 mg | ORAL_TABLET | Freq: Every morning | ORAL | Status: DC
  Administered 2021-02-26 – 2021-02-28 (×3): 50 mg via ORAL
  Filled 2021-02-25: qty 120
  Filled 2021-02-25 (×3): qty 1
  Filled 2021-02-25: qty 60
  Filled 2021-02-25: qty 1

## 2021-02-25 NOTE — [facility] Group Notes (Signed)
Psychoeducational Group Note  Date: 02/23/2021 Time: 0900-1000    Goal Setting   Purpose of Group: This group helps to provide patients with the steps of setting a goal that is specific, measurable, attainable, realistic and time specific. A discussion on how we keep ourselves stuck with negative self talk.    Participation Level:  Active  Participation Quality:  Appropriate  Affect:  Appropriate  Cognitive:  Appropriate  Insight:  Improving  Engagement in Group:  Engaged  Additional Comments:  Pt states that she is looking for a new beginning. Wants a half way house/ Recover[facility]y house.  Paulino Rily

## 2021-02-25 NOTE — Progress Notes (Signed)
Progress note    02/25/21 0710  Psych Admission Type (Psych Patients Only)  Admission Status Voluntary  Psychosocial Assessment  Patient Complaints Anxiety  Eye Contact Fair  Facial Expression Animated;Anxious  Affect Anxious;Appropriate to circumstance  Speech Logical/coherent  Interaction Assertive  Motor Activity Pacing  Appearance/Hygiene Unremarkable  Behavior Characteristics Cooperative;Appropriate to situation;Anxious  Mood Anxious;Pleasant  Thought Process  Coherency WDL  Content WDL  Delusions WDL  Perception WDL  Hallucination None reported or observed  Judgment Poor  Confusion None  Danger to Self  Current suicidal ideation? Denies  Danger to Others  Danger to Others None reported or observed

## 2021-02-25 NOTE — Progress Notes (Signed)
Patient was cooperative with treatment, she was medication compliant, and no behavioral issues to report on shift at this time.
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
## 2021-02-25 NOTE — Plan of Care (Signed)
  Problem: Coping: Goal: Ability to demonstrate self-control will improve Outcome: Progressing   Problem: Health Behavior/Discharge Planning: Goal: Ability to identify changes in lifestyle to reduce recurrence of condition will improve Outcome: Progressing Goal: Identification of resources available to assist in meeting health care needs will improve Outcome: Progressing

## 2021-02-25 NOTE — Group Note (Signed)
Group Topic: Other  Group Date: 02/25/2021 Start Time: 1000 End Time: 1100 Facilitators: Amarionna Arca, Tyrell Antonio, NT  Department: [place] [facility]  Number of Participants: 8  Group Focus: activities of daily living skills, daily focus, and nursing group  Name: Michelle Page Date of Birth: 1975/09/09  MR: [id number]    Level of Participation: Pt did attend nursing psycho ed group.
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
## 2021-02-25 NOTE — Group Note (Signed)
Group Topic: Goal Setting  Group Date: 02/25/2021 Start Time: 0900 End Time: 0930 Facilitators: Loney Loh, NT  Department: [facility]  Number of Participants: 8  Interventions utilized were orientation  Name: Michelle Page Date of Birth: Feb 23, 1976  MR: [id number]    Level of Participation: Pt did attend morning goals group.

## 2021-02-26 MED ORDER — CLOTRIMAZOLE 1 % EX CREA
TOPICAL_CREAM | Freq: Two times a day (BID) | CUTANEOUS | Status: DC
  Administered 2021-02-27 – 2021-02-28 (×2): 1 via TOPICAL
  Filled 2021-02-26: qty 15

## 2021-02-26 MED ORDER — WHITE PETROLATUM EX OINT
TOPICAL_OINTMENT | CUTANEOUS | Status: AC
  Filled 2021-02-26: qty 5

## 2021-02-26 NOTE — Progress Notes (Signed)
   02/26/21 0400  Psych Admission Type (Psych Patients Only)  Admission Status Voluntary  Psychosocial Assessment  Patient Complaints Anxiety  Eye Contact Fair  Facial Expression Animated;Anxious  Affect Anxious;Appropriate to circumstance  Speech Logical/coherent  Interaction Assertive  Motor Activity Pacing  Appearance/Hygiene Unremarkable  Behavior Characteristics Cooperative;Appropriate to situation  Mood Anxious  Thought Process  Coherency WDL  Content WDL  Delusions WDL  Perception WDL  Hallucination None reported or observed  Judgment Poor  Confusion None  Danger to Self  Current suicidal ideation? Denies  Danger to Others  Danger to Others None reported or observed

## 2021-02-26 NOTE — Progress Notes (Deleted)
The patient expressed in group that she will be going to a rehabilitation center on [address]. Her goal for tomorrow is to have an easy day even though she expects to be quite anxious.

## 2021-02-26 NOTE — Progress Notes (Signed)
Pt continues to be anxious. Pt complied with scheduled medications. Pt currently denies SI/ HI/AVH and pain to this writer during the shift. Pt interacts appropriately with others in the milieu. Pt verbally contracted for safety.   A: Support and encouragement was offered and accepted. Pt medications were administered per order. Safety rounds continued and maintained Q 15 throughout the shift.    R: Safety maintained. Will continue to monitor and assess.      02/26/21 1500  Psych Admission Type (Psych Patients Only)  Admission Status Voluntary  Psychosocial Assessment  Patient Complaints Anxiety  Eye Contact Fair  Facial Expression Animated;Anxious  Affect Anxious;Appropriate to circumstance  Speech Logical/coherent  Interaction Assertive  Motor Activity Pacing  Appearance/Hygiene Unremarkable  Behavior Characteristics Cooperative;Appropriate to situation  Thought Process  Coherency WDL  Content WDL  Delusions WDL  Perception WDL  Hallucination None reported or observed  Judgment Poor  Confusion None  Danger to Self  Current suicidal ideation? Denies  Danger to Others  Danger to Others None reported or observed

## 2021-02-26 NOTE — Progress Notes (Signed)
Adult Psychoeducational Group Note  Date:  02/26/2021 Time:  1:08 AM  Group Topic/Focus:  Wrap-Up Group:   The focus of this group is to help patients review their daily goal of treatment and discuss progress on daily workbooks.  Participation Level:  Active  Participation Quality:  Appropriate and Attentive  Affect:  Appropriate  Cognitive:  Alert and Appropriate  Insight: Appropriate  Engagement in Group:  Engaged  Modes of Intervention:  Discussion  Additional Comments:  Pt stated she had a good day although other peers slept most of the day and felt alone. Michelle Page Michelle Page 02/26/2021, 1:08 AM
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
## 2021-02-26 NOTE — Group Note (Signed)
Group Topic: Healthy Support Systems  Group Date: 02/26/2021 Start Time: 1000 End Time: 1020 Facilitators: Thereasa Distance  Department: [facility]  Number of Participants: 5  Group Focus: Healthy Support Systems  Treatment Modality:  Patient-Centered Therapy Interventions utilized were patient education and support Purpose: enhance coping skills, express feelings, and increase insight  Name: Michelle Page Date of Birth: [birth year]  MR: [id number]    Level of Participation: active Quality of Participation: attentive Interactions with others: gave feedback Mood/Affect: appropriate   Patients Problems:  Patient Active Problem List   Diagnosis Date Noted   Facial bruising 02/18/2021   Chronic hip pain, right 02/18/2021   Moderate cocaine use disorder ([place]) 12/15/2020   Substance abuse ([place]) 12/15/2020   Alcohol abuse 12/15/2020   MDD (major depressive disorder), recurrent, severe, with psychosis ([place]) 12/14/2020   Headache disorder 08/16/2020   Anxiety disorder 08/16/2020   Annual physical exam 07/21/2020   Intermittent lightheadedness 07/21/2020   Schizoaffective disorder ([place]) 06/09/2020   Post traumatic stress disorder 06/09/2020   Heart murmur 01/30/2019   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   Obesity (BMI 30-39.9) 01/27/2019   Vitamin D deficiency disease 01/27/2019   Encounter for gynecological examination with Papanicolaou smear of cervix 11/26/2018   Hemorrhoids 11/26/2018   ASCUS of cervix with negative high risk HPV 01/29/2017   Bipolar I disorder, single manic episode, severe, with psychosis ([place]) 09/12/2011

## 2021-02-26 NOTE — Group Note (Signed)
Group Topic: Goal Setting  Group Date: 02/26/2021 Start Time: 0930 End Time: 1000 Facilitators: Thereasa Distance  Department: [facility]B  Number of Participants: 5  Group Focus: goals/reality orientation Treatment Modality:  Patient-Centered Therapy Interventions utilized were clarification, orientation, and support Purpose: The purpose of this group is to talk about daily goals, what will be done to obtain those goals, rules of the unit and staff introduction.  Name: Michelle Page Date of Birth: 1975/12/07  MR: [id number]    Level of Participation: active Quality of Participation: attentive Interactions with others: gave feedback Mood/Affect: appropriate   Patients Problems:  Patient Active Problem List   Diagnosis Date Noted   Facial bruising 02/18/2021   Chronic hip pain, right 02/18/2021   Moderate cocaine use disorder ([place]) 12/15/2020   Substance abuse ([place]) 12/15/2020   Alcohol abuse 12/15/2020   MDD (major depressive disorder), recurrent, severe, with psychosis ([place]) 12/14/2020   Headache disorder 08/16/2020   Anxiety disorder 08/16/2020   Annual physical exam 07/21/2020   Intermittent lightheadedness 07/21/2020   Schizoaffective disorder ([place]) 06/09/2020   Post traumatic stress disorder 06/09/2020   Heart murmur 01/30/2019   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   Obesity (BMI 30-39.9) 01/27/2019   Vitamin D deficiency disease 01/27/2019   Encounter for gynecological examination with Papanicolaou smear of cervix 11/26/2018   Hemorrhoids 11/26/2018   ASCUS of cervix with negative high risk HPV 01/29/2017   Bipolar I disorder, single manic episode, severe, with psychosis ([place]) 09/12/2011

## 2021-02-26 NOTE — Progress Notes (Addendum)
[facility] MD Progress Note  02/26/2021 9:53 AM Michelle Page  MRN:  [id number]   Subjective:  Michelle Page reported " I am doing okay."  Evaluation: Michelle Page was seen and evaluated face-to-face.  She is awake alert and oriented x3.  Denying suicidal or homicidal ideations.  Reports visual hallucinations which mainly are prevalent at night.  States " I feel myself walking on the hall."  Reported seeing shadows for the past 3 to 5 years.  Denied auditory hallucination.  Michelle Page to continue Thorazine for mood stabilization.  She reports taking and tolerating medication well.  States she feels as if the medication has been helping with her hallucinations and mood.  She reports feeling slightly anxious around dinnertime.  She is requesting an additional dose of Thorazine at that time.  Education was provided with distraction techniques for managing anxiety.  Discussed as needed dose available.  Patient was receptive to plan.  Reports she is eager to start her journey with [facility] due to her alcohol addiction.  Chart review labs pending for valproic acid level, hepatic function and CMP scheduled for 02/27/2021.  EKG pending.  Principal Problem: Bipolar I disorder, single manic episode, severe, with psychosis ([place]) Diagnosis: Principal Problem:   Bipolar I disorder, single manic episode, severe, with psychosis ([place]) Active Problems:   Post traumatic stress disorder   Moderate cocaine use disorder ([place])   Substance abuse ([place])   Alcohol abuse   Anxiety disorder   Facial bruising   Chronic hip pain, right  Total Time spent with patient: 15 minutes  Past Psychiatric History:   Past Medical History:  Past Medical History:  Diagnosis Date   Abnormal Pap smear    colpo/leep   Anemia    Anxiety    Cancer ([place]) 2006   Skin   Chlamydia infection    Depression    Depression    Phreesia 06/08/2020   Depression    Phreesia 07/20/2020   Fractures    Gestational diabetes    metformin   H/O  candidiasis    H/O varicella    Headache(784.0)    migraines as a child    Heart murmur    High risk HPV infection    Melanoma of face ([place]) 01/29/2017   2006  Formatting of this note might be different from the original. 2006   Moderate cocaine use disorder ([place]) 12/15/2020   Obesity (BMI 30-39.9) 01/27/2019   Papanicolaou smear of cervix with positive high risk human papilloma virus (HPV) test 06/03/2017   Pregnancy induced hypertension    1st & 2nd pregnancy   Pregnancy induced hypertension    1st & 2nd pregnancy    Two vessel umbilical cord, antepartum 11/26/2011   Vaginal Pap smear, abnormal    Vitamin D deficiency disease 01/27/2019   Yeast infection     Past Surgical History:  Procedure Laterality Date   APPENDECTOMY  08/14/2017   COLPOSCOPY     LAPAROSCOPIC APPENDECTOMY N/A 08/14/2017   Procedure: APPENDECTOMY LAPAROSCOPIC;  Surgeon: Erroll Luna, MD;  Location: [place];  Service: General;  Laterality: N/A;   LEEP     TUBAL LIGATION N/A 01/22/2018   Procedure: POST PARTUM TUBAL LIGATION;  Surgeon: Gwynne Edinger, MD;  Location: [place];  Service: Gynecology;  Laterality: N/A;   WISDOM TOOTH EXTRACTION     Family History:  Family History  Problem Relation Age of Onset   Alcohol abuse Father    Emphysema Father    COPD Father  Mental illness Father        PTSD   Post-traumatic stress disorder Father    Alcohol abuse Brother    Stroke Brother    Cancer Paternal Grandmother        skin cancer   Multiple sclerosis Sister    Depression Mother    Bipolar disorder Mother    Anxiety disorder Mother    Asthma Son    Heart disease Maternal Grandfather    Family Psychiatric  History:  Social History:  Social History   Substance and Sexual Activity  Alcohol Use Yes     Social History   Substance and Sexual Activity  Drug Use Yes   Types: Cocaine    Social History   Socioeconomic History   Marital status: Significant Other    Spouse name: todd    Number of children: 6   Years of education: 13   Highest education level: Not on file  Occupational History   Occupation: unemployed    Comment: caregiver  Tobacco Use   Smoking status: Former    Types: Cigarettes   Smokeless tobacco: Never   Tobacco comments:    quit smoking in2007  Vaping Use   Vaping Use: Never used  Substance and Sexual Activity   Alcohol use: Yes   Drug use: Yes    Types: Cocaine   Sexual activity: Yes    Birth control/protection: Surgical    Comment: tubal  Other Topics Concern   Not on file  Social History Narrative   Divorced.Lives in home with 6 children and boyfriend of 4 years.   Unemployed.   Social Determinants of Health   Financial Resource Strain: Not on file  Food Insecurity: Not on file  Transportation Needs: Not on file  Physical Activity: Not on file  Stress: Not on file  Social Connections: Not on file   Sleep: Fair  Appetite:  Good  Current Medications: Current Facility-Administered Medications  Medication Dose Route Frequency Provider Last Rate Last Admin   acetaminophen (TYLENOL) tablet 650 mg  650 mg Oral Q6H PRN Rozetta Nunnery, NP   650 mg at 02/22/21 1836   alum & mag hydroxide-simeth (MAALOX/MYLANTA) 200-200-20 MG/5ML suspension 30 mL  30 mL Oral Q4H PRN [place] Romp A, NP   30 mL at 02/21/21 1316   chlorproMAZINE (THORAZINE) tablet 100 mg  100 mg Oral QID PRN Maida Sale, MD       Or   chlorproMAZINE (THORAZINE) injection 50 mg  50 mg Intramuscular QID PRN Hill, Jackie Plum, MD       chlorproMAZINE (THORAZINE) tablet 200 mg  200 mg Oral QHS Massengill, Ovid Curd, MD   200 mg at 02/25/21 2033   chlorproMAZINE (THORAZINE) tablet 25 mg  25 mg Oral TID PRN Maida Sale, MD   25 mg at 02/25/21 1620   chlorproMAZINE (THORAZINE) tablet 50 mg  50 mg Oral Daily Rosezetta Schlatter, MD       chlorproMAZINE (THORAZINE) tablet 50 mg  50 mg Oral q AM Rosezetta Schlatter, MD   50 mg at 02/26/21 7253   divalproex (DEPAKOTE  ER) 24 hr tablet 750 mg  750 mg Oral QHS Massengill, Ovid Curd, MD   750 mg at 02/25/21 2035   lidocaine (LIDODERM) 5 % 1 patch  1 patch Transdermal Daily PRN Rosezetta Schlatter, MD       magnesium hydroxide (MILK OF MAGNESIA) suspension 30 mL  30 mL Oral Daily PRN Rozetta Nunnery, NP  melatonin tablet 5 mg  5 mg Oral QHS Hill, Jackie Plum, MD   5 mg at 02/25/21 2035   metroNIDAZOLE (FLAGYL) tablet 500 mg  500 mg Oral Q12H Rosezetta Schlatter, MD   500 mg at 02/26/21 9379   mirtazapine (REMERON) tablet 15 mg  15 mg Oral QHS Massengill, Ovid Curd, MD   15 mg at 02/25/21 2030   Muscle Rub CREA   Topical PRN Maida Sale, MD   1 application at 02/40/97 1418   nicotine (NICODERM CQ - dosed in mg/24 hours) patch 14 mg  14 mg Transdermal Daily Bobbitt, Shalon E, NP   14 mg at 02/26/21 3532   prazosin (MINIPRESS) capsule 2 mg  2 mg Oral QHS Rosezetta Schlatter, MD   2 mg at 02/25/21 2031   thiamine tablet 100 mg  100 mg Oral Daily [place] Romp A, NP   100 mg at 02/26/21 9924   topiramate (TOPAMAX) tablet 25 mg  25 mg Oral BID Rosezetta Schlatter, MD   25 mg at 02/26/21 2683    Lab Results: No results found for this or any previous visit (from the past 48 hour(s)).  Blood Alcohol level:  Lab Results  Component Value Date   ETH 17 (H) 41/96/2229    Metabolic Disorder Labs: Lab Results  Component Value Date   HGBA1C 5.0 02/18/2021   MPG 96.8 02/18/2021   MPG 108.28 12/15/2020   No results found for: PROLACTIN Lab Results  Component Value Date   CHOL 154 02/18/2021   TRIG 104 02/18/2021   HDL 51 02/18/2021   CHOLHDL 3.0 02/18/2021   VLDL 21 02/18/2021   LDLCALC 82 02/18/2021   LDLCALC 104 (H) 12/15/2020    Physical Findings: AIMS: Facial and Oral Movements Muscles of Facial Expression: None, normal Lips and Perioral Area: None, normal Jaw: None, normal Tongue: None, normal,Extremity Movements Upper (arms, wrists, hands, fingers): None, normal Lower (legs, knees, ankles, toes): None,  normal, Trunk Movements Neck, shoulders, hips: None, normal, Overall Severity Severity of abnormal movements (highest score from questions above): None, normal Incapacitation due to abnormal movements: None, normal Patient's awareness of abnormal movements (rate only patient's report): No Awareness, Dental Status Current problems with teeth and/or dentures?: No Does patient usually wear dentures?: No  CIWA:  CIWA-Ar Total: 0    Musculoskeletal: Strength & Muscle Tone: within normal limits Gait & Station: normal Patient leans: N/A  Psychiatric Specialty Exam:  Presentation  General Appearance: Casual; Fairly Groomed  Eye Contact:Good  Speech:Clear and Coherent; Normal Rate  Speech Volume:Normal  Handedness:Right   Mood and Affect  Mood:Anxious; Euthymic  Affect:Congruent; Other (comment) (Anxious)   Thought Process  Thought Processes:Goal Directed; Linear; Coherent  Descriptions of Associations:Intact  Orientation:Full (Time, Place and Person)  Thought Content:Logical  History of Schizophrenia/Schizoaffective disorder:No  Duration of Psychotic Symptoms:Greater than six months  Hallucinations:No data recorded Ideas of Reference:None  Suicidal Thoughts:No data recorded Homicidal Thoughts:No data recorded  Sensorium  Memory:Immediate Good; Recent Good; Remote Good  Judgment:Good  Insight:Good   Executive Functions  Concentration:Good  Attention Span:Good  [place]  Language:Good   Psychomotor Activity  Psychomotor Activity:No data recorded  Assets  Assets:Communication Skills; Desire for Improvement; Resilience   Sleep  Sleep:No data recorded   Physical Exam: Physical Exam Vitals reviewed.  Neurological:     Mental Status: She is alert.  Psychiatric:        Attention and Perception: Attention normal.        Mood and  Affect: Mood normal.        Speech: Speech normal.        Thought Content: Thought  content is delusional.        Cognition and Memory: Cognition normal.        Judgment: Judgment normal.   Review of Systems  Psychiatric/Behavioral:  Positive for depression and substance abuse. Negative for suicidal ideas. The patient is nervous/anxious.   All other systems reviewed and are negative. Blood pressure 118/87, pulse 88, temperature 98.4 F (36.9 C), temperature source Oral, resp. rate 18, height 5\' 4"  (1.626 m), weight 93.4 kg, last menstrual period 02/08/2021, SpO2 98 %, currently breastfeeding. Body mass index is 35.36 kg/m.   Treatment Plan Summary: Daily contact with patient to assess and evaluate symptoms and progress in treatment and Medication management  Continue with current treatment plan on 02/26/2021 as listed below except were noted  Bipolar disorder with psychosis: Substance induced mood disorder:   Continue Thorazine 50 mg p.o. twice daily and 200 mg p.o. nightly Continue prazosin 2 mg p.o. nightly Continue mirtazapine 15 mg p.o. nightly Continue melatonin 5 mg p.o. nightly  Labs pending: Repeat orders for CBC, valproic acid level and hepatic function panel and EKG  CSW to continue working on discharge disposition.  Patient scheduled for 02/28/2021 discharge to [place], NP 02/26/2021, 9:53 AM I have reviewed the patient's chart and discussed the case with the APP. I agree with the assessment and plan of care as documented in the APP's note.  Viann Fish, MD, [facility]

## 2021-02-26 NOTE — Group Note (Signed)
LCSW Group Therapy Note   Group Date: 02/26/2021 Start Time: 1100 End Time: 1200   Type of Therapy and Topic:  Group Therapy: Boundaries  Participation Level:  Active  Description of Group: This group will address the use of boundaries in their personal lives. Patients will explore why boundaries are important, the difference between healthy and unhealthy boundaries, and negative and postive outcomes of different boundaries and will look at how boundaries can be crossed.  Patients will be encouraged to identify current boundaries in their own lives and identify what kind of boundary is being set. Facilitators will guide patients in utilizing problem-solving interventions to address and correct types boundaries being used and to address when no boundary is being used. Understanding and applying boundaries will be explored and addressed for obtaining and maintaining a balanced life. Patients will be encouraged to explore ways to assertively make their boundaries and needs known to significant others in their lives, using other group members and facilitator for role play, support, and feedback.  Therapeutic Goals:  1.  Patient will identify areas in their life where setting clear boundaries could be  used to improve their life.  2.  Patient will identify signs/triggers that a boundary is not being respected. 3.  Patient will identify two ways to set boundaries in order to achieve balance in  their lives: 4.  Patient will demonstrate ability to communicate their needs and set boundaries  through discussion and/or role plays  Summary of Patient Progress:  Michelle Page was intermittently present/active throughout the session and proved open to feedback from [place] and peers. Patient demonstrated good insight into the subject matter, was respectful of peers, and was present when able, kept having to get up and walk around or leave the room due to a problem with her sciatic nerve.  Therapeutic Modalities:    Cognitive Behavioral Therapy Solution-Focused Therapy  Maretta Los, LCSWA 02/26/2021  2:58 PM

## 2021-02-27 ENCOUNTER — Other Ambulatory Visit (HOSPITAL_COMMUNITY): Payer: Self-pay

## 2021-02-27 LAB — CBC WITH DIFFERENTIAL/PLATELET
Abs Immature Granulocytes: 0.02 10*3/uL (ref 0.00–0.07)
Basophils Absolute: 0 10*3/uL (ref 0.0–0.1)
Basophils Relative: 0 %
Eosinophils Absolute: 0.1 10*3/uL (ref 0.0–0.5)
Eosinophils Relative: 2 %
HCT: 36.8 % (ref 36.0–46.0)
Hemoglobin: 12.1 g/dL (ref 12.0–15.0)
Immature Granulocytes: 0 %
Lymphocytes Relative: 24 %
Lymphs Abs: 1.7 10*3/uL (ref 0.7–4.0)
MCH: 30.5 pg (ref 26.0–34.0)
MCHC: 32.9 g/dL (ref 30.0–36.0)
MCV: 92.7 fL (ref 80.0–100.0)
Monocytes Absolute: 0.5 10*3/uL (ref 0.1–1.0)
Monocytes Relative: 8 %
Neutro Abs: 4.6 10*3/uL (ref 1.7–7.7)
Neutrophils Relative %: 66 %
Platelets: 173 10*3/uL (ref 150–400)
RBC: 3.97 MIL/uL (ref 3.87–5.11)
RDW: 13.4 % (ref 11.5–15.5)
WBC: 6.9 10*3/uL (ref 4.0–10.5)
nRBC: 0 % (ref 0.0–0.2)

## 2021-02-27 LAB — HEPATIC FUNCTION PANEL
ALT: 26 U/L (ref 0–44)
AST: 21 U/L (ref 15–41)
Albumin: 4 g/dL (ref 3.5–5.0)
Alkaline Phosphatase: 54 U/L (ref 38–126)
Bilirubin, Direct: <0.1 mg/dL (ref 0.0–0.2)
Total Bilirubin: 0.4 mg/dL (ref 0.3–1.2)
Total Protein: 6.8 g/dL (ref 6.5–8.1)

## 2021-02-27 LAB — VALPROIC ACID LEVEL: Valproic Acid Lvl: 25 ug/mL — ABNORMAL LOW (ref 50.0–100.0)

## 2021-02-27 NOTE — Progress Notes (Signed)
   02/27/21 1200  Psych Admission Type (Psych Patients Only)  Admission Status Voluntary  Psychosocial Assessment  Patient Complaints None  Eye Contact Fair  Facial Expression Animated;Anxious  Affect Anxious;Appropriate to circumstance  Speech Logical/coherent  Interaction Assertive  Motor Activity Pacing  Appearance/Hygiene Unremarkable  Behavior Characteristics Cooperative  Mood Anxious  Thought Process  Coherency WDL  Content WDL  Delusions WDL  Perception WDL  Hallucination None reported or observed  Judgment Poor  Confusion None  Danger to Self  Current suicidal ideation? Denies  Danger to Others  Danger to Others None reported or observed

## 2021-02-27 NOTE — BHH Counselor (Signed)
Events affecting the discharge plan: - This patient is to discharge to [facility] treatment facility tomorrow morning and will need to arrive by 10am  Interventions by CSW:  - CSW completed discharge note -CSW called treatment facility to confirm acceptance and to discuss required medications at discharge.  Emotional response of the patient/family to the plan of care: - Pt is agreeable to this discharge plan.     Darletta Moll MSW, LCSW Clincal Social Worker  [facility]

## 2021-02-27 NOTE — Group Note (Signed)
LCSW Group Therapy Note   Group Date: 02/27/2021 Start Time: 1300 End Time: 1400   Type of Therapy and Topic:  Group Therapy: Challenging Core Beliefs  Participation Level:  Minimal  Description of Group:  Patients were educated about core beliefs and asked to identify one harmful core belief that they have. Patients were asked to explore from where those beliefs originate. Patients were asked to discuss how those beliefs make them feel and the resulting behaviors of those beliefs. They were then be asked if those beliefs are true and, if so, what evidence they have to support them. Lastly, group members were challenged to replace those negative core beliefs with helpful beliefs.   Therapeutic Goals:   1. Patient will identify harmful core beliefs and explore the origins of such beliefs. 2. Patient will identify feelings and behaviors that result from those core beliefs. 3. Patient will discuss whether such beliefs are true. 4.  Patient will replace harmful core beliefs with helpful ones.  Summary of Patient Progress:  Michelle Page actively engaged in processing and exploring how core beliefs are formed and how they impact thoughts, feelings, and behaviors. Patient left during beginning of group and did not return.    Therapeutic Modalities: Cognitive Behavioral Therapy; [place] A Michelle Dede, LCSW 02/27/2021  1:52 PM
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
## 2021-02-27 NOTE — Progress Notes (Signed)
Patient compliant with medications Denies SI/HI/A/VH and verbally contracted for safety. Patient remains safe. No adverse drug noted.

## 2021-02-27 NOTE — BHH Group Notes (Signed)
[facility] Group Notes:  (Nursing/MHT/Case Management/Adjunct)  Date:  02/27/2021  Time:  1:11 AM  Type of Therapy:  Psychoeducational Skills  Participation Level:  Active  Participation Quality:  Appropriate  Affect:  Anxious  Cognitive:  Appropriate  Insight:  Good  Engagement in Group:  Developing/Improving  Modes of Intervention:  Education  Summary of Progress/Problems: Patient states that she is going to a rehab facility on [address]. Her goal for tomorrow is to try to have an easy day even though she anticipates having a  lot of anxiety.   Cheney Ewart S 02/27/2021, 1:11 AM

## 2021-02-27 NOTE — BHH Group Notes (Signed)
[facility] Group Notes:  (Nursing/MHT/Case Management/Adjunct)  Date:  02/27/2021  Time:  10:52 AM  Type of Therapy:   Orientation/Goals group  Participation Level:  Active  Participation Quality:  Appropriate  Affect:  Appropriate  Cognitive:  Appropriate  Insight:  Appropriate  Engagement in Group:  Engaged  Modes of Intervention:  Discussion, Education, and Orientation  Summary of Progress/Problems: Pt goal for today is to get things in order so that she can get discharged tomorrow.   Kooper Chriswell J Aloni Chuang 02/27/2021, 10:52 AM

## 2021-02-27 NOTE — Progress Notes (Signed)
Pt didn't attend therapeutic relaxation group.

## 2021-02-27 NOTE — BHH Group Notes (Signed)
[facility] Group Notes:  (Nursing/MHT/Case Management/Adjunct)  Date:  02/27/2021  Time:  10:09 PM  Type of Therapy:   Wrap up group  Participation Level:  Active  Participation Quality:  Appropriate and Attentive  Affect:  Appropriate  Cognitive:  Alert and Appropriate  Insight:  Appropriate  Engagement in Group:  Engaged  Modes of Intervention:  Discussion and Education  Summary of Progress/Problems:  Sahian reported her goal today was talk about discharge.  Goal for tomorrow to "get safely to where I need to go tomorrow."  Barbette Or Sosha Shepherd 02/27/2021, 10:09 PM

## 2021-02-27 NOTE — Progress Notes (Signed)
Pt didn't attend psycho-ed group.

## 2021-02-28 MED ORDER — CHLORPROMAZINE HCL 50 MG PO TABS: 50.0000 mg | ORAL_TABLET | Freq: Two times a day (BID) | ORAL | 0 refills | Status: DC

## 2021-02-28 MED ORDER — CHLORPROMAZINE HCL 200 MG PO TABS: 200.0000 mg | ORAL_TABLET | Freq: Every day | ORAL | 0 refills | Status: DC

## 2021-02-28 MED ORDER — DIVALPROEX SODIUM ER 250 MG PO TB24: 750.0000 mg | ORAL_TABLET | Freq: Every day | ORAL | 0 refills | Status: DC

## 2021-02-28 MED ORDER — PRAZOSIN HCL 2 MG PO CAPS: 2.0000 mg | ORAL_CAPSULE | Freq: Every day | ORAL | 0 refills | Status: DC

## 2021-02-28 MED ORDER — MELATONIN 5 MG PO TABS: 5.0000 mg | ORAL_TABLET | Freq: Every day | ORAL | 0 refills | Status: DC

## 2021-02-28 MED ORDER — NICOTINE 14 MG/24HR TD PT24: 14.0000 mg | MEDICATED_PATCH | Freq: Every day | TRANSDERMAL | 0 refills | Status: DC

## 2021-02-28 MED ORDER — CHLORPROMAZINE HCL 25 MG PO TABS: 25.0000 mg | ORAL_TABLET | Freq: Three times a day (TID) | ORAL | 0 refills | Status: DC | PRN

## 2021-02-28 MED ORDER — TOPIRAMATE 25 MG PO TABS: 25.0000 mg | ORAL_TABLET | Freq: Two times a day (BID) | ORAL | 0 refills | Status: DC

## 2021-02-28 MED ORDER — MIRTAZAPINE 15 MG PO TABS: 15.0000 mg | ORAL_TABLET | Freq: Every day | ORAL | 0 refills | Status: DC

## 2021-02-28 NOTE — Progress Notes (Signed)
Patient is calm, cooperative and pleasant this shift, stated she is looking forward to discharge in the morning. Denies SI/HI/A/VH and verbally contracted for safety. Patient remains safe. Support and encouragement ongoing without self harm gestures.

## 2021-02-28 NOTE — Progress Notes (Signed)
Recreation Therapy Notes  INPATIENT RECREATION TR PLAN  Patient Details Name: Landyn LONDYN HOTARD MRN: [id number] DOB: 1975/11/02 Today's Date: 02/28/2021  Rec Therapy Plan Is patient appropriate for Therapeutic Recreation?: Yes Treatment times per week: about 3 days Estimated Length of Stay: 5-7 days TR Treatment/Interventions: Group participation (Comment)  Discharge Criteria Pt will be discharged from therapy if:: Discharged Treatment plan/goals/alternatives discussed and agreed upon by:: Patient/family  Discharge Summary Short term goals set: See care plan Short term goals met: Complete Progress toward goals comments: Groups attended Which groups?: Coping skills, Leisure education, Other (Comment) (Personal Development, Triggers) Reason goals not met: None Therapeutic equipment acquired: N/A Reason patient discharged from therapy: Discharge from hospital Pt/family agrees with progress & goals achieved: Yes Date patient discharged from therapy: 02/28/21   Victorino Sparrow, LRT/CTRS [place]ia Comment, [place] 02/28/2021, 1:04 PM

## 2021-02-28 NOTE — Progress Notes (Signed)
Pt discharged to lobby. Pt was stable and appreciative at that time. All papers, samples and prescriptions were given and valuables returned. Verbal understanding expressed. Denies SI/HI and A/VH. Pt given opportunity to express concerns and ask questions.  

## 2021-02-28 NOTE — Progress Notes (Signed)
  [facility] Adult Case Management Discharge Plan :  Will you be returning to the same living situation after discharge:  No. Is to discharge to [place] At discharge, do you have transportation home?: No. Safe Transport to be arranged Do you have the ability to pay for your medications: No. ?Samples to be provided at discharge  Release of information consent forms completed and in the chart;  Patient's signature needed at discharge.  Patient to Follow up at:  Follow-up Information     [facility] Follow up on 03/01/2021.   Specialty: Behavioral Health Why: You have an appointment for therapy services on 03/01/21 at 10:00 am (Virtual). Contact information: [address] [place] [phone]        Services, [facility]. Go on 03/03/2021.   Why: You have a hospital follow up appointment for medication management services on 03/03/21 at  9:00 am.  This appointment will be held in person. Contact information: [place] [address] [phone]                 Next level of care provider has access to [place] and Suicide Prevention discussed: Yes,  with patient     Has patient been referred to the Quitline?: Patient refused referral  Patient has been referred for addiction treatment: Yes  Vassie Moselle, LCSW 02/28/2021, 9:18 AM

## 2021-02-28 NOTE — Group Note (Signed)
Recreation Therapy Group Note   Group Topic:Self-Esteem  Group Date: 02/28/2021 Start Time: 1000 End Time: 1035 Facilitators: Victorino Sparrow, LRT/CTRS Location: [address] Day[address]   Goal Area(s) Addresses:  Patient will be able to identify what influences self esteem. Patient will be able to identify positive traits about themselves. Patient will identify importance of having positive self esteem.    Group Description: Patients and LRT discussed what influences self esteem.  Patients and LRT talked about how internal and external factors can influence how one feels about themselves.  Patients were then given a picture of a blank face, and told to illustrate and describe how they see themselves. Patients were given colored pencils, markers, and crayons to complete the assignment. Patients shared their completed assignment with each other.  Patients were also asked to identify how others see them.  Patients and LRT debriefed on the importance of having confidence in ones self and not letting the negative outshine the positive.   Clinical Observations/Individualized Feedback: Pt had already been discharged.     Victorino Sparrow, LRT/CTRS 02/28/2021 12:28 PM

## 2021-02-28 NOTE — Plan of Care (Signed)
Patient was able to identify triggers for anxiety at completion of recreation therapy group sessions.   Michelle Page, LRT/CTRS

## 2021-03-01 ENCOUNTER — Other Ambulatory Visit: Payer: Self-pay

## 2021-03-01 ENCOUNTER — Telehealth: Payer: Self-pay

## 2021-03-01 ENCOUNTER — Telehealth (HOSPITAL_COMMUNITY): Payer: Self-pay | Admitting: Clinical

## 2021-03-01 ENCOUNTER — Ambulatory Visit (HOSPITAL_COMMUNITY): Payer: Medicaid Other | Admitting: Clinical

## 2021-03-01 NOTE — Telephone Encounter (Signed)
Transition Care Management Unsuccessful Follow-up Telephone Call  Date of discharge and from where:  behavioral health  Attempts:  1st Attempt  Reason for unsuccessful TCM follow-up call:  Left voice message

## 2021-03-01 NOTE — Telephone Encounter (Signed)
The patient did not respond to contact attempts this is the 2x in a row patient was a no show. Further no show will result in discharge

## 2021-03-28 ENCOUNTER — Other Ambulatory Visit: Payer: Self-pay

## 2021-03-28 ENCOUNTER — Ambulatory Visit (INDEPENDENT_AMBULATORY_CARE_PROVIDER_SITE_OTHER): Payer: Medicaid Other | Admitting: Clinical

## 2021-03-28 DIAGNOSIS — F333 Major depressive disorder, recurrent, severe with psychotic symptoms: Secondary | ICD-10-CM | POA: Diagnosis not present

## 2021-03-28 NOTE — Progress Notes (Signed)
Virtual Visit via Video Note   I connected with Michelle Page on 03/28/21 at  2:00 PM EDT by a video enabled telemedicine application and verified that I am speaking with the correct person using two identifiers.   Location: Patient: Home Provider: Office   I discussed the limitations of evaluation and management by telemedicine and the availability of in person appointments. The patient expressed understanding and agreed to proceed.   THERAPIST PROGRESS NOTE   Session Time: 2:00PM-2:30PM   Participation Level: Active   Behavioral Response: DisheveledAlertDepressed   Type of Therapy: Individual Therapy   Treatment Goals addressed: Coping   Interventions: CBT and Motivational Interviewing   Summary: Michelle Page is a 45 y.o. female who presents with Schizoaffective Disorder Depressive type. The OPT therapist worked with the patient for her ongoing [place]. The OPT therapist utilized Motivational Interviewing to assist in creating therapeutic repore. The patient in the session was engaged and work in collaboration giving feedback about her triggers and symptoms over the past few weeks. The patient since her last session was hospitalized for S/I and then released to a residential treatment facility.. The patient spoke about her residential treatment facility experience and noted she left treatment early.The OPT therapist utilized Cognitive Behavioral Therapy through cognitive restructuring as well as worked with the patient on coping strategies to assist in management of symptoms. The patient verbalized her current medication is working a lot better. The patient noted she has a medication follow up this upcoming Friday. The patient noted her concern that she is binge eating and this leads to Depression and then alcohol use. The patient identified that alcohol and street drugs where involved prior to her hospitalization. The patient is currently involved with [facility] for  medication management.    Suicidal/Homicidal: Nowithout intent/plan   Therapist Response: The OPT therapist worked with the patient for the patients scheduled session. The patient was engaged in her session and gave feedback in relation to triggers, symptoms, and behavior responses over the past few weeks. The OPT therapist worked with the patient utilizing an in session Cognitive Behavioral Therapy exercise. The patient was responsive in the session and verbalized, " I know I have this pattern of binge eating , getting Depressed, and then drinking because of the Depression". The patient indicated intent to complete all recommendations. The patient spoke about needing a mood stabilizer and having that conversation with her psychiatrist Friday.The patient spoke about her support network being upset with her because she did not complete the Residential Treatment program.The OPT therapist inquired about the patients substance use and S/I over the past 2 weeks . The patient notes she has been absent of substance use and reported no current S/I.The OPT therapist will continue treatment work with the patient in her next scheduled session.   Plan: Return again in 2/3 weeks.   Diagnosis:      Axis I: Major depressive disorder, recurrent, severe with psychotic features                           Axis II: No diagnosis   I discussed the assessment and treatment plan with the patient. The patient was provided an opportunity to ask questions and all were answered. The patient agreed with the plan and demonstrated an understanding of the instructions.   The patient was advised to call back or seek an in-person evaluation if the symptoms worsen or if the condition fails to improve  as anticipated.   I provided 30 minutes of non-face-to-face time during this encounter.   Michelle Grumbles, LCSW   03/28/2021

## 2021-03-29 ENCOUNTER — Ambulatory Visit: Payer: Medicaid Other

## 2021-04-19 ENCOUNTER — Telehealth (HOSPITAL_COMMUNITY): Payer: Self-pay | Admitting: Clinical

## 2021-04-19 ENCOUNTER — Other Ambulatory Visit: Payer: Self-pay

## 2021-04-19 ENCOUNTER — Ambulatory Visit (HOSPITAL_COMMUNITY): Payer: Medicaid Other | Admitting: Clinical

## 2021-04-19 NOTE — Telephone Encounter (Signed)
The patient did not respond to contact attempts/VM. and missed this scheduled appointment.

## 2021-08-22 ENCOUNTER — Ambulatory Visit (INDEPENDENT_AMBULATORY_CARE_PROVIDER_SITE_OTHER): Payer: Medicaid Other | Admitting: Adult Health

## 2021-08-22 ENCOUNTER — Encounter: Payer: Self-pay | Admitting: Adult Health

## 2021-08-22 ENCOUNTER — Other Ambulatory Visit (HOSPITAL_COMMUNITY)
Admission: RE | Admit: 2021-08-22 | Discharge: 2021-08-22 | Disposition: A | Discharge status: Home / Self Care | Payer: Medicaid Other | Source: Ambulatory Visit | Attending: Women's Health | Admitting: Women's Health

## 2021-08-22 ENCOUNTER — Other Ambulatory Visit: Payer: Medicaid Other | Admitting: Women's Health

## 2021-08-22 VITALS — BP 133/98 | HR 96 | Ht 64.0 in | Wt 213.0 lb

## 2021-08-22 DIAGNOSIS — Z13220 Encounter for screening for lipoid disorders: Secondary | ICD-10-CM

## 2021-08-22 DIAGNOSIS — Z01419 Encounter for gynecological examination (general) (routine) without abnormal findings: Secondary | ICD-10-CM

## 2021-08-22 DIAGNOSIS — Z1211 Encounter for screening for malignant neoplasm of colon: Secondary | ICD-10-CM

## 2021-08-22 DIAGNOSIS — N926 Irregular menstruation, unspecified: Secondary | ICD-10-CM

## 2021-08-22 DIAGNOSIS — Z113 Encounter for screening for infections with a predominantly sexual mode of transmission: Secondary | ICD-10-CM

## 2021-08-22 DIAGNOSIS — Z1231 Encounter for screening mammogram for malignant neoplasm of breast: Secondary | ICD-10-CM

## 2021-08-22 DIAGNOSIS — Z0000 Encounter for general adult medical examination without abnormal findings: Secondary | ICD-10-CM | POA: Insufficient documentation

## 2021-08-22 DIAGNOSIS — R030 Elevated blood-pressure reading, without diagnosis of hypertension: Secondary | ICD-10-CM

## 2021-08-22 DIAGNOSIS — Z1329 Encounter for screening for other suspected endocrine disorder: Secondary | ICD-10-CM

## 2021-08-22 LAB — HEMOCCULT GUIAC POC 1CARD (OFFICE): Fecal Occult Blood, POC: NEGATIVE

## 2021-08-22 NOTE — Progress Notes (Signed)
Patient ID: Michelle Page, female   DOB: [birth year], 46 y.o.   MRN: [id number] ?History of Present Illness: ?Michelle Page is a 46 year old white female, with SO, [id number] in for a well woman gyn exam and pap. She is going to [facility]. ?PCP is Dr Posey Pronto, but she has not seen yet. ? ? ?Current Medications, Allergies, Past Medical History, Past Surgical History, Family History and Social History were reviewed in [employer] record.   ? ? ?Review of Systems: ? ?Patient denies any headaches, hearing loss, fatigue, blurred vision, shortness of breath, chest pain, abdominal pain, problems with bowel movements, urination, or intercourse. No joint pain or mood swings.  ?Has irregular periods at times. ? ? ?Physical Exam:BP (!) 133/98 (BP Location: Left Arm, Cuff Size: Large)   Pulse 96   Ht '5\' 4"'$  (1.626 m)   Wt 213 lb (96.6 kg)   LMP 08/16/2021   Breastfeeding No   BMI 36.56 kg/m?   ?General:  Well developed, well nourished, no acute distress ?Skin:  Warm and dry ?Neck:  Midline trachea, normal thyroid, good ROM, no lymphadenopathy ?Lungs; Clear to auscultation bilaterally ?Breast:  No dominant palpable mass, retraction, or nipple discharge ?Cardiovascular: Regular rate and rhythm ?Abdomen:  Soft, non tender, no hepatosplenomegaly ?Pelvic:  External genitalia is normal in appearance, no lesions.  The vagina is normal in appearance. Urethra has no lesions or masses. The cervix is bulbous.Pap with GC/CHL and HR HPV genotyping performed.   Uterus is felt to be normal size, shape, and contour.  No adnexal masses or tenderness noted.Bladder is non tender, no masses felt. ?Rectal: Good sphincter tone, no polyps, or hemorrhoids felt.  Hemoccult negative. ?Extremities/musculoskeletal:  No swelling or varicosities noted, no clubbing or cyanosis ?Psych:  No mood changes, alert and cooperative,seems happy ?AA is 4 ?Fall risk is low ? ?  08/22/2021  ?  2:53 PM 07/28/2020  ?  3:15 PM 07/21/2020  ? 10:57 AM   ?Depression screen PHQ 2/9  ?Decreased Interest 2  0  ?Down, Depressed, Hopeless 2  0  ?PHQ - 2 Score 4  0  ?Altered sleeping 3    ?Tired, decreased energy 2    ?Change in appetite 3    ?Feeling bad or failure about yourself  2    ?Trouble concentrating 3    ?Moving slowly or fidgety/restless 2    ?Suicidal thoughts 0    ?PHQ-9 Score 19    ?Difficult doing work/chores     ?  ? Information is confidential and restricted. Go to Review Flowsheets to unlock data.  ? She is on meds and goes to [facility]  ? ?  08/22/2021  ?  2:54 PM  ?GAD 7 : Generalized Anxiety Score  ?Nervous, Anxious, on Edge 3  ?Control/stop worrying 2  ?Worry too much - different things 2  ?Trouble relaxing 3  ?Restless 3  ?Easily annoyed or irritable 2  ?Afraid - awful might happen 0  ?Total GAD 7 Score 15  ? ? [facility]stream - 08/22/21 1447   ? ?  ? Pregnancy Intention Screening  ? Does the patient want to become pregnant in the next year? No   ? Does the patient's partner want to become pregnant in the next year? No   ? Would the patient like to discuss contraceptive options today? No   ?  ? Contraception Wrap Up  ? Current Method Female Sterilization   ? End Method Female Sterilization   ? ?  ?  ? ?  ?  Co exam with Balinda Quails NP student ? ?  ?Impression and Plan: ?1. Routine general medical examination at a health care facility ?Pap sent ?- Cytology - PAP( [facility]) ? ?2. Encounter for gynecological examination with Papanicolaou smear of cervix ?Pap sent  ?Pap in 3 years if normal ?GYN  physical in 1 year ?Will check labs  ?- CBC ?- Comprehensive metabolic panel ? ?3. Encounter for screening fecal occult blood testing ?Hemoccult was negative ?Colonoscopy at 66  ?- POCT occult blood stool ? ?4. Screening examination for STD (sexually transmitted disease) ?- HIV Antibody (routine testing w rflx) ?- RPR ?- Hepatitis B surface antigen ? ?5. Screening cholesterol level ?- Lipid panel ? ?6. Screening for thyroid disorder ?- TSH ? ?7. Irregular  periods ?Will watch for now, could be peri menopause related ?- TSH ? ?8. Screening mammogram for breast cancer ?Mammogram scheduled for her 08/28/21 at [facility] at 3 pm  ?- MM 3D SCREEN BREAST BILATERAL; Future ? ?9. Elevated BP without diagnosis of hypertension ?Keep check on BP and keep log can check at therpy  ? ? ? ?  ?  ?

## 2021-08-23 LAB — CBC
Hematocrit: 43.4 % (ref 34.0–46.6)
Hemoglobin: 15 g/dL (ref 11.1–15.9)
MCH: 31.2 pg (ref 26.6–33.0)
MCHC: 34.6 g/dL (ref 31.5–35.7)
MCV: 90 fL (ref 79–97)
Platelets: 206 10*3/uL (ref 150–450)
RBC: 4.81 x10E6/uL (ref 3.77–5.28)
RDW: 12.2 % (ref 11.7–15.4)
WBC: 6 10*3/uL (ref 3.4–10.8)

## 2021-08-23 LAB — COMPREHENSIVE METABOLIC PANEL
ALT: 29 IU/L (ref 0–32)
AST: 24 IU/L (ref 0–40)
Albumin/Globulin Ratio: 2 (ref 1.2–2.2)
Albumin: 4.7 g/dL (ref 3.8–4.8)
Alkaline Phosphatase: 90 IU/L (ref 44–121)
BUN/Creatinine Ratio: 10 (ref 9–23)
BUN: 7 mg/dL (ref 6–24)
Bilirubin Total: <0.2 mg/dL (ref 0.0–1.2)
CO2: 22 mmol/L (ref 20–29)
Calcium: 9.2 mg/dL (ref 8.7–10.2)
Chloride: 105 mmol/L (ref 96–106)
Creatinine, Ser: 0.69 mg/dL (ref 0.57–1.00)
Globulin, Total: 2.3 g/dL (ref 1.5–4.5)
Glucose: 89 mg/dL (ref 70–99)
Potassium: 4.3 mmol/L (ref 3.5–5.2)
Sodium: 141 mmol/L (ref 134–144)
Total Protein: 7 g/dL (ref 6.0–8.5)
eGFR: 110 mL/min/{1.73_m2} (ref >59)

## 2021-08-23 LAB — LIPID PANEL
Chol/HDL Ratio: 3.7 ratio (ref 0.0–4.4)
Cholesterol, Total: 159 mg/dL (ref 100–199)
HDL: 43 mg/dL (ref >39)
LDL Chol Calc (NIH): 96 mg/dL (ref 0–99)
Triglycerides: 112 mg/dL (ref 0–149)
VLDL Cholesterol Cal: 20 mg/dL (ref 5–40)

## 2021-08-23 LAB — RPR: RPR Ser Ql: NONREACTIVE

## 2021-08-23 LAB — TSH: TSH: 1.76 u[IU]/mL (ref 0.450–4.500)

## 2021-08-23 LAB — HEPATITIS B SURFACE ANTIGEN: Hepatitis B Surface Ag: NEGATIVE

## 2021-08-23 LAB — HIV ANTIBODY (ROUTINE TESTING W REFLEX): HIV Screen 4th Generation wRfx: NONREACTIVE

## 2021-08-24 LAB — CYTOLOGY - PAP
Adequacy: ABSENT
Chlamydia: NEGATIVE
Comment: NEGATIVE
Comment: NEGATIVE
Comment: NORMAL
Diagnosis: NEGATIVE
High risk HPV: NEGATIVE
Neisseria Gonorrhea: NEGATIVE

## 2021-08-28 ENCOUNTER — Ambulatory Visit (HOSPITAL_COMMUNITY)
Admission: RE | Admit: 2021-08-28 | Discharge: 2021-08-28 | Disposition: A | Discharge status: Home / Self Care | Payer: Medicaid Other | Source: Ambulatory Visit | Attending: Adult Health | Admitting: Adult Health

## 2021-08-28 DIAGNOSIS — Z1231 Encounter for screening mammogram for malignant neoplasm of breast: Secondary | ICD-10-CM | POA: Insufficient documentation

## 2021-09-15 ENCOUNTER — Encounter: Payer: Self-pay | Admitting: Internal Medicine

## 2021-09-15 ENCOUNTER — Ambulatory Visit (INDEPENDENT_AMBULATORY_CARE_PROVIDER_SITE_OTHER): Payer: Medicaid Other | Admitting: Internal Medicine

## 2021-09-15 VITALS — BP 122/86 | HR 124 | Resp 18 | Ht 64.0 in | Wt 221.2 lb

## 2021-09-15 DIAGNOSIS — Z720 Tobacco use: Secondary | ICD-10-CM | POA: Insufficient documentation

## 2021-09-15 DIAGNOSIS — R030 Elevated blood-pressure reading, without diagnosis of hypertension: Secondary | ICD-10-CM | POA: Diagnosis not present

## 2021-09-15 DIAGNOSIS — F333 Major depressive disorder, recurrent, severe with psychotic symptoms: Secondary | ICD-10-CM

## 2021-09-15 NOTE — Assessment & Plan Note (Signed)
Smokes about 1 pack/day  Asked about quitting: confirms that he/she currently smokes cigarettes Advise to quit smoking: Educated about QUITTING to reduce the risk of cancer, cardio and cerebrovascular disease. Assess willingness: Unwilling to quit at this time, but is working on cutting back. Assist with counseling and pharmacotherapy: Counseled for 5 minutes and literature provided. Arrange for follow up: follow up in 3 months and continue to offer help. 

## 2021-09-15 NOTE — Progress Notes (Signed)
Established Patient Office Visit  Subjective:  Patient ID: Michelle Page, female    DOB: 03-Jun-1975  Age: 46 y.o. MRN: [id number]  CC:  Chief Complaint  Patient presents with   Follow-up    Follow up pt states bp has been running high     HPI Michelle Page is a 46 y.o. female with past medical history of schizoaffective disorder, PTSD, insomnia, ASCUS of cervix and melanoma who presents for f/u of her chronic medical conditions.  She had elevated BP during her recent visit with OB/GYN and [facility] psychiatry.  Of note, BP is well-controlled today. Takes medications regularly. Patient denies headache, dizziness, chest pain, dyspnea or palpitations.  She admits that she has high soda intake - had 2 liter of soft drinks yesterday. She has tachycardia today, but denies any palpitations. She has h/o polysubstance abuse, but denies alcohol or substance abuse currently.  She follows up with [facility] for MDD/schizoaffective disorder.     Past Medical History:  Diagnosis Date   Abnormal Pap smear    colpo/leep   Anemia    Anxiety    Cancer ([place]) 2006   Skin   Chlamydia infection    Depression    Depression    Phreesia 06/08/2020   Depression    Phreesia 07/20/2020   Fractures    Gestational diabetes    metformin   H/O candidiasis    H/O varicella    Headache(784.0)    migraines as a child    Heart murmur    High risk HPV infection    Melanoma of face ([place]) 01/29/2017   2006  Formatting of this note might be different from the original. 2006   Moderate cocaine use disorder ([place]) 12/15/2020   Obesity (BMI 30-39.9) 01/27/2019   Papanicolaou smear of cervix with positive high risk human papilloma virus (HPV) test 06/03/2017   Pregnancy induced hypertension    1st & 2nd pregnancy   Pregnancy induced hypertension    1st & 2nd pregnancy    Two vessel umbilical cord, antepartum 11/26/2011   Vaginal Pap smear, abnormal    Vitamin D deficiency disease 01/27/2019    Yeast infection     Past Surgical History:  Procedure Laterality Date   APPENDECTOMY  08/14/2017   COLPOSCOPY     LAPAROSCOPIC APPENDECTOMY N/A 08/14/2017   Procedure: APPENDECTOMY LAPAROSCOPIC;  Surgeon: Erroll Luna, MD;  Location: [place];  Service: General;  Laterality: N/A;   LEEP     TUBAL LIGATION N/A 01/22/2018   Procedure: POST PARTUM TUBAL LIGATION;  Surgeon: Gwynne Edinger, MD;  Location: [place];  Service: Gynecology;  Laterality: N/A;   WISDOM TOOTH EXTRACTION      Family History  Problem Relation Age of Onset   Alcohol abuse Father    Emphysema Father    COPD Father    Mental illness Father        PTSD   Post-traumatic stress disorder Father    Alcohol abuse Brother    Stroke Brother    Cancer Paternal Grandmother        skin cancer   Multiple sclerosis Sister    Depression Mother    Bipolar disorder Mother    Anxiety disorder Mother    Asthma Son    Heart disease Maternal Grandfather     Social History   Socioeconomic History   Marital status: Significant Other    Spouse name: todd   Number of children: 6   Years  of education: 13   Highest education level: Not on file  Occupational History   Occupation: unemployed    Comment: caregiver  Tobacco Use   Smoking status: Every Day    Packs/day: 1.00    Types: Cigarettes   Smokeless tobacco: Never  Vaping Use   Vaping Use: Never used  Substance and Sexual Activity   Alcohol use: Yes    Comment: occ   Drug use: Not Currently    Types: Cocaine   Sexual activity: Yes    Birth control/protection: Surgical    Comment: tubal  Other Topics Concern   Not on file  Social History Narrative   Divorced.Lives in home with 6 children and boyfriend of 4 years.   Unemployed.   Social Determinants of Health   Financial Resource Strain: Medium Risk   Difficulty of Paying Living Expenses: Somewhat hard  Food Insecurity: Food Insecurity Present   Worried About Charity fundraiser in the Last  Year: Sometimes true   Arboriculturist in the Last Year: Often true  Transportation Needs: Unmet Transportation Needs   Lack of Transportation (Medical): Yes   Lack of Transportation (Non-Medical): No  Physical Activity: Inactive   Days of Exercise per Week: 0 days   Minutes of Exercise per Session: 0 min  Stress: Stress Concern Present   Feeling of Stress : Rather much  Social Connections: Socially Isolated   Frequency of Communication with Friends and Family: Twice a week   Frequency of Social Gatherings with Friends and Family: More than three times a week   Attends Religious Services: Never   Marine scientist or Organizations: No   Attends Music therapist: Never   Marital Status: Divorced  Human resources officer Violence: Not At Risk   Fear of Current or Ex-Partner: No   Emotionally Abused: No   Physically Abused: No   Sexually Abused: No    Outpatient Medications Prior to Visit  Medication Sig Dispense Refill   chlorproMAZINE (THORAZINE) 200 MG tablet Take 1 tablet (200 mg total) by mouth at bedtime. 30 tablet 0   chlorproMAZINE (THORAZINE) 25 MG tablet Take 1 tablet (25 mg total) by mouth 3 (three) times daily as needed (anxiety). 30 tablet 0   chlorproMAZINE (THORAZINE) 50 MG tablet Take 1 tablet (50 mg total) by mouth 2 (two) times daily. At 8 AM and 3 PM 60 tablet 0   mirtazapine (REMERON) 15 MG tablet Take 1 tablet (15 mg total) by mouth at bedtime. 30 tablet 0   prazosin (MINIPRESS) 2 MG capsule Take 1 capsule (2 mg total) by mouth at bedtime. 30 capsule 0   topiramate (TOPAMAX) 25 MG tablet Take 1 tablet (25 mg total) by mouth 2 (two) times daily. 60 tablet 0   No facility-administered medications prior to visit.    Allergies  Allergen Reactions   Valproic Acid Rash    ROS Review of Systems  Constitutional:  Positive for fatigue. Negative for chills and fever.  HENT:  Negative for congestion, sinus pressure, sinus pain and sore throat.   Eyes:   Negative for pain and discharge.  Respiratory:  Negative for cough and shortness of breath.   Cardiovascular:  Negative for chest pain and palpitations.  Gastrointestinal:  Negative for abdominal pain, diarrhea, nausea and vomiting.  Endocrine: Negative for polydipsia and polyuria.  Genitourinary:  Negative for dysuria and hematuria.  Musculoskeletal:  Negative for neck pain and neck stiffness.  Skin:  Negative for rash.  Neurological:  Negative for dizziness and weakness.  Psychiatric/Behavioral:  Positive for dysphoric mood and sleep disturbance. Negative for agitation and behavioral problems. The patient is nervous/anxious.      Objective:    Physical Exam Vitals reviewed.  Constitutional:      General: She is not in acute distress.    Appearance: She is not diaphoretic.  HENT:     Head: Normocephalic and atraumatic.     Nose: Nose normal.     Mouth/Throat:     Mouth: Mucous membranes are moist.  Eyes:     General: No scleral icterus.    Extraocular Movements: Extraocular movements intact.  Cardiovascular:     Rate and Rhythm: Regular rhythm. Tachycardia present.     Pulses: Normal pulses.     Heart sounds: Normal heart sounds. No murmur heard. Pulmonary:     Breath sounds: Normal breath sounds. No wheezing or rales.  Musculoskeletal:     Cervical back: Neck supple. No tenderness.     Right lower leg: No edema.     Left lower leg: No edema.  Skin:    General: Skin is warm.     Findings: No rash.  Neurological:     General: No focal deficit present.     Mental Status: She is alert and oriented to person, place, and time.  Psychiatric:        Mood and Affect: Mood normal.        Behavior: Behavior normal.    BP 122/86 (BP Location: Left Arm, Patient Position: Sitting, Cuff Size: Normal)   Pulse (!) 124   Resp 18   Ht 5' 4" (1.626 m)   Wt 221 lb 3.2 oz (100.3 kg)   LMP 08/16/2021   SpO2 96%   BMI 37.97 kg/m  Wt Readings from Last 3 Encounters:  09/15/21 221  lb 3.2 oz (100.3 kg)  08/22/21 213 lb (96.6 kg)  02/17/21 206 lb (93.4 kg)    Lab Results  Component Value Date   TSH 1.760 08/22/2021   Lab Results  Component Value Date   WBC 6.0 08/22/2021   HGB 15.0 08/22/2021   HCT 43.4 08/22/2021   MCV 90 08/22/2021   PLT 206 08/22/2021   Lab Results  Component Value Date   NA 141 08/22/2021   K 4.3 08/22/2021   CO2 22 08/22/2021   GLUCOSE 89 08/22/2021   BUN 7 08/22/2021   CREATININE 0.69 08/22/2021   BILITOT <0.2 08/22/2021   ALKPHOS 90 08/22/2021   AST 24 08/22/2021   ALT 29 08/22/2021   PROT 7.0 08/22/2021   ALBUMIN 4.7 08/22/2021   CALCIUM 9.2 08/22/2021   ANIONGAP 9 02/17/2021   EGFR 110 08/22/2021   Lab Results  Component Value Date   CHOL 159 08/22/2021   Lab Results  Component Value Date   HDL 43 08/22/2021   Lab Results  Component Value Date   LDLCALC 96 08/22/2021   Lab Results  Component Value Date   TRIG 112 08/22/2021   Lab Results  Component Value Date   CHOLHDL 3.7 08/22/2021   Lab Results  Component Value Date   HGBA1C 5.0 02/18/2021      Assessment & Plan:   Problem List Items Addressed This Visit       Other   MDD (major depressive disorder), recurrent, severe, with psychosis ([place]) - Primary    On Remeron, prazosin and Topamax currently Follows up with psychiatrist - [facility] Has a behavioral health therapist  Prehypertension    BP Readings from Last 1 Encounters:  09/15/21 122/86  Well-controlled today Needs to cut down soft drink intake to improve tachycardia Advised DASH diet and moderate exercise/walking, at least 150 mins/week       Tobacco abuse    Smokes about 1 pack/day  Asked about quitting: confirms that he/she currently smokes cigarettes Advise to quit smoking: Educated about QUITTING to reduce the risk of cancer, cardio and cerebrovascular disease. Assess willingness: Unwilling to quit at this time, but is working on cutting back. Assist with  counseling and pharmacotherapy: Counseled for 5 minutes and literature provided. Arrange for follow up: follow up in 3 months and continue to offer help.       No orders of the defined types were placed in this encounter.   Follow-up: Return in about 4 months (around 01/16/2022) for BP check.    Lindell Spar, MD

## 2021-09-15 NOTE — Patient Instructions (Signed)
Please cut down soft drink intake.  Please cut down -> quit smoking.  Please continue taking medications as prescribed.

## 2021-09-15 NOTE — Assessment & Plan Note (Signed)
BP Readings from Last 1 Encounters:  09/15/21 122/86   Well-controlled today Needs to cut down soft drink intake to improve tachycardia Advised DASH diet and moderate exercise/walking, at least 150 mins/week

## 2021-09-15 NOTE — Assessment & Plan Note (Addendum)
On Remeron, prazosin and Topamax currently Follows up with psychiatrist - [facility] Has a behavioral health therapist

## 2021-11-19 ENCOUNTER — Ambulatory Visit
Admission: EM | Admit: 2021-11-19 | Discharge: 2021-11-19 | Disposition: A | Discharge status: Home / Self Care | Payer: Medicaid Other | Attending: Nurse Practitioner | Admitting: Nurse Practitioner

## 2021-11-19 ENCOUNTER — Ambulatory Visit (INDEPENDENT_AMBULATORY_CARE_PROVIDER_SITE_OTHER): Payer: Medicaid Other

## 2021-11-19 DIAGNOSIS — W19XXXA Unspecified fall, initial encounter: Secondary | ICD-10-CM | POA: Diagnosis not present

## 2021-11-19 DIAGNOSIS — S90122A Contusion of left lesser toe(s) without damage to nail, initial encounter: Secondary | ICD-10-CM | POA: Diagnosis not present

## 2021-11-19 DIAGNOSIS — M79672 Pain in left foot: Secondary | ICD-10-CM

## 2021-11-19 MED ORDER — IBUPROFEN 800 MG PO TABS: 800.0000 mg | ORAL_TABLET | Freq: Three times a day (TID) | ORAL | 0 refills | Status: DC | PRN

## 2021-11-19 NOTE — Discharge Instructions (Addendum)
Your x-rays are negative for fracture or dislocation.  Symptoms are consistent with a contusion or bruise to the toe. May take over-the-counter ibuprofen or Tylenol for pain or discomfort. RICE therapy rest, ice, compression, and elevation until your symptoms improve.  Apply ice for 20 minutes, remove for 1 hour, then repeat as often as possible.  This will help with pain and swelling. Wear the post-op shoe with strenuous or prolonged activity. If symptoms do not improve within the next 2 to 4 weeks, recommend following up with orthopedics.  You can follow-up with [facility] at [phone] or [facility] in [place] at [phone].

## 2021-11-19 NOTE — ED Provider Notes (Signed)
[facility]    CSN: [id number] Arrival date & time: 11/19/21  6144      History   Chief Complaint Chief Complaint  Patient presents with   Toe Pain    HPI Michelle Page is a 46 y.o. female.   The history is provided by the patient.   Patient presents for complaints of pain in the second toe of the left foot.  Patient states approximately 1 week ago she injured the toe while getting up in the middle of the night.  She states 2 days ago she then stubbed the toe on a dresser.  She states she has had swelling and pain in the second toe of the left foot since that time.  She also states she had bruising, but that has since improved.  She states the area around the toe is painful.  She also has pain with walking.  She denies numbness, tingling, radiation of pain.  Denies any previous injury to the left second toe or left foot.  She has been taking ibuprofen for her symptoms.  Past Medical History:  Diagnosis Date   Abnormal Pap smear    colpo/leep   Anemia    Anxiety    Cancer ([place]) 2006   Skin   Chlamydia infection    Depression    Depression    Phreesia 06/08/2020   Depression    Phreesia 07/20/2020   Fractures    Gestational diabetes    metformin   H/O candidiasis    H/O varicella    Headache(784.0)    migraines as a child    Heart murmur    High risk HPV infection    Melanoma of face ([place]) 01/29/2017   2006  Formatting of this note might be different from the original. 2006   Moderate cocaine use disorder ([place]) 12/15/2020   Obesity (BMI 30-39.9) 01/27/2019   Papanicolaou smear of cervix with positive high risk human papilloma virus (HPV) test 06/03/2017   Pregnancy induced hypertension    1st & 2nd pregnancy   Pregnancy induced hypertension    1st & 2nd pregnancy    Two vessel umbilical cord, antepartum 11/26/2011   Vaginal Pap smear, abnormal    Vitamin D deficiency disease 01/27/2019   Yeast infection     Patient Active Problem List   Diagnosis  Date Noted   Prehypertension 09/15/2021   Tobacco abuse 09/15/2021   Irregular periods 08/22/2021   Chronic hip pain, right 02/18/2021   Moderate cocaine use disorder ([place]) 12/15/2020   Substance abuse ([place]) 12/15/2020   Alcohol abuse 12/15/2020   MDD (major depressive disorder), recurrent, severe, 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   Anxiety disorder 08/16/2020   Annual physical exam 07/21/2020   Schizoaffective disorder ([place]) 06/09/2020   Post traumatic stress disorder 06/09/2020   Heart murmur 01/30/2019   Obesity (BMI 30-39.9) 01/27/2019   Vitamin D deficiency disease 01/27/2019   Hemorrhoids 11/26/2018   ASCUS of cervix with negative high risk HPV 01/29/2017   Bipolar I disorder, single manic episode, severe, with psychosis ([place]) 09/12/2011    Past Surgical History:  Procedure Laterality Date   APPENDECTOMY  08/14/2017   COLPOSCOPY     LAPAROSCOPIC APPENDECTOMY N/A 08/14/2017   Procedure: APPENDECTOMY LAPAROSCOPIC;  Surgeon: Erroll Luna, MD;  Location: [facility];  Service: General;  Laterality: N/A;   LEEP     TUBAL LIGATION N/A 01/22/2018   Procedure: POST PARTUM TUBAL LIGATION;  Surgeon: Gwynne Edinger, MD;  Location: [facility];  Service: Gynecology;  Laterality: N/A;   WISDOM TOOTH EXTRACTION      OB History     Gravida  8   Para  7   Term  7   Preterm  0   AB  1   Living  7      SAB      IAB  1   Ectopic      Multiple  0   Live Births  7            Home Medications    Prior to Admission medications   Medication Sig Start Date End Date Taking? Authorizing Provider  ibuprofen (ADVIL) 800 MG tablet Take 1 tablet (800 mg total) by mouth every 8 (eight) hours as needed. 11/19/21  Yes Gannon Heinzman-Warren, Alda Lea, NP  chlorproMAZINE (THORAZINE) 200 MG tablet Take 1 tablet (200 mg total) by mouth at bedtime. 02/28/21   Massengill, Ovid Curd, MD  chlorproMAZINE (THORAZINE) 25 MG tablet Take 1 tablet (25 mg total) by mouth 3 (three) times daily as  needed (anxiety). 02/28/21   Massengill, Ovid Curd, MD  chlorproMAZINE (THORAZINE) 50 MG tablet Take 1 tablet (50 mg total) by mouth 2 (two) times daily. At 8 AM and 3 PM 02/28/21   Massengill, Ovid Curd, MD  mirtazapine (REMERON) 15 MG tablet Take 1 tablet (15 mg total) by mouth at bedtime. 02/28/21   Massengill, Ovid Curd, MD  prazosin (MINIPRESS) 2 MG capsule Take 1 capsule (2 mg total) by mouth at bedtime. 02/28/21   Massengill, Ovid Curd, MD  topiramate (TOPAMAX) 25 MG tablet Take 1 tablet (25 mg total) by mouth 2 (two) times daily. 02/28/21   Massengill, Ovid Curd, MD    Family History Family History  Problem Relation Age of Onset   Alcohol abuse Father    Emphysema Father    COPD Father    Mental illness Father        PTSD   Post-traumatic stress disorder Father    Alcohol abuse Brother    Stroke Brother    Cancer Paternal Grandmother        skin cancer   Multiple sclerosis Sister    Depression Mother    Bipolar disorder Mother    Anxiety disorder Mother    Asthma Son    Heart disease Maternal Grandfather     Social History Social History   Tobacco Use   Smoking status: Every Day    Packs/day: 1.00    Types: Cigarettes   Smokeless tobacco: Never  Vaping Use   Vaping Use: Never used  Substance Use Topics   Alcohol use: Yes    Comment: occ   Drug use: Not Currently    Types: Cocaine     Allergies   Valproic acid   Review of Systems Review of Systems Per HPI  Physical Exam Triage Vital Signs ED Triage Vitals  Enc Vitals Group     BP 11/19/21 0900 112/75     Pulse Rate 11/19/21 0900 99     Resp 11/19/21 0900 20     Temp 11/19/21 0900 98.3 F (36.8 C)     Temp Source 11/19/21 0900 Oral     SpO2 11/19/21 0900 96 %     Weight --      Height --      Head Circumference --      Peak Flow --      Pain Score 11/19/21 0903 8     Pain Loc --  Pain Edu? --      Excl. in [place]? --    No data found.  Updated Vital Signs BP 112/75 (BP Location: Right Arm)   Pulse 99    Temp 98.3 F (36.8 C) (Oral)   Resp 20   LMP  (Within Months) Comment: 1 month  SpO2 96%   Visual Acuity Right Eye Distance:   Left Eye Distance:   Bilateral Distance:    Right Eye Near:   Left Eye Near:    Bilateral Near:     Physical Exam Vitals and nursing note reviewed.  Constitutional:      General: She is not in acute distress.    Appearance: Normal appearance.  HENT:     Head: Normocephalic.  Eyes:     Extraocular Movements: Extraocular movements intact.     Pupils: Pupils are equal, round, and reactive to light.  Pulmonary:     Effort: Pulmonary effort is normal.  Musculoskeletal:     Cervical back: Normal range of motion.     Left foot: Decreased range of motion. Normal capillary refill. Swelling and tenderness present. No deformity. Normal pulse.     Comments: Swelling and erythema to the second toe of the left foot.  Second toe with decreased range of motion and tenderness.  There is no obvious deformity or ecchymosis present. +2 DP/PT pulses  Skin:    General: Skin is warm and dry.  Neurological:     General: No focal deficit present.     Mental Status: She is alert and oriented to person, place, and time.  Psychiatric:        Mood and Affect: Mood normal.        Behavior: Behavior normal.      UC Treatments / Results  Labs (all labs ordered are listed, but only abnormal results are displayed) Labs Reviewed - No data to display  EKG   Radiology DG Foot Complete Left  Result Date: 11/19/2021 CLINICAL DATA:  Fall 1 week ago with foot pain and swelling at the first and second MTP joints. EXAM: LEFT FOOT - COMPLETE 3+ VIEW COMPARISON:  None Available. FINDINGS: There is no evidence of fracture or dislocation. There is no evidence of arthropathy or other focal bone abnormality. Soft tissues are unremarkable. IMPRESSION: Negative. Electronically Signed   By: Jorje Guild M.D.   On: 11/19/2021 09:24    Procedures Procedures (including critical care  time)  Medications Ordered in UC Medications - No data to display  Initial Impression / Assessment and Plan / UC Course  I have reviewed the triage vital signs and the nursing notes.  Pertinent labs & imaging results that were available during my care of the patient were reviewed by me and considered in my medical decision making (see chart for details).  Patient presents for pain and swelling to the second left toe after injuring her foot twice in the past week.  On exam, patient has swelling to the second toe decreased range of motion.  X-ray showed no fracture or dislocation.  Symptoms are consistent with a contusion of the second left toe.  Will buddy tape and provide postop shoe for the patient today.  Ibuprofen 800 mg was also prescribed for pain management.  Supportive care recommendations were provided to the patient.  Patient was advised that if her symptoms do not improve within the next 2 to 4 weeks to follow-up with orthopedics.  Information was provided for [facility] [place] and for [facility]  in [place]. Final Clinical Impressions(s) / UC Diagnoses   Final diagnoses:  Contusion of second toe of left foot, initial encounter     Discharge Instructions      Your x-rays are negative for fracture or dislocation.  Symptoms are consistent with a contusion or bruise to the toe. May take over-the-counter ibuprofen or Tylenol for pain or discomfort. RICE therapy rest, ice, compression, and elevation until your symptoms improve.  Apply ice for 20 minutes, remove for 1 hour, then repeat as often as possible.  This will help with pain and swelling. Wear the post-op shoe with strenuous or prolonged activity. If symptoms do not improve within the next 2 to 4 weeks, recommend following up with orthopedics.  You can follow-up with [facility] [place] at [phone] or [facility] in [place] at [phone].      ED Prescriptions     Medication Sig Dispense Auth.  Provider   ibuprofen (ADVIL) 800 MG tablet Take 1 tablet (800 mg total) by mouth every 8 (eight) hours as needed. 30 tablet Kymoni Monday-Warren, Alda Lea, NP      PDMP not reviewed this encounter.   Tish Men, NP 11/19/21 [phone]

## 2021-11-19 NOTE — ED Triage Notes (Signed)
Pt reports pain and swelling in second x 1 week after she stepped over. Ibuprofen gives no relief.

## 2021-12-07 ENCOUNTER — Ambulatory Visit (INDEPENDENT_AMBULATORY_CARE_PROVIDER_SITE_OTHER): Payer: Medicaid Other | Admitting: Internal Medicine

## 2021-12-07 ENCOUNTER — Encounter: Payer: Self-pay | Admitting: Internal Medicine

## 2021-12-07 VITALS — BP 124/84 | HR 117 | Resp 16 | Ht 64.0 in | Wt 213.0 lb

## 2021-12-07 DIAGNOSIS — S90122D Contusion of left lesser toe(s) without damage to nail, subsequent encounter: Secondary | ICD-10-CM | POA: Diagnosis not present

## 2021-12-07 DIAGNOSIS — S90122A Contusion of left lesser toe(s) without damage to nail, initial encounter: Secondary | ICD-10-CM | POA: Insufficient documentation

## 2021-12-07 MED ORDER — IBUPROFEN 800 MG PO TABS: 800.0000 mg | ORAL_TABLET | Freq: Three times a day (TID) | ORAL | 0 refills | Status: DC | PRN

## 2021-12-07 NOTE — Assessment & Plan Note (Signed)
Persistent left foot pain and swelling after minor injury X-ray of the foot was negative for any acute fracture or dislocation in ER, but was given ibuprofen, refilled today Referred to podiatry Advised to perform RICE measures - rest, ice, compression, elevation Work note provided

## 2021-12-07 NOTE — Progress Notes (Signed)
Acute Office Visit  Subjective:    Patient ID: Michelle Page, female    DOB: [birth year], 46 y.o.   MRN: [id number]  Chief Complaint  Patient presents with   Toe Injury    Patient stumped toe 11-16-21 went to urgent care but still has knot and is painful and swollen hurts to walk on it it is the left foot 2nd toe stays numb all the time     HPI Patient is in today for complaint of left second toe pain and swelling after having a injury while she was on vacation about 3 weeks ago.  She reports hitting her foot at nighttime to a wooden bed.  She had another injury on the same side in a day.  She went to urgent care for it and was given ibuprofen after ruling out fracture on x-ray of foot.  She has had constant pain and swelling in the area.  She also reports numbness in the first 3 toes of the left foot.  Past Medical History:  Diagnosis Date   Abnormal Pap smear    colpo/leep   Anemia    Anxiety    Cancer ([place]) 2006   Skin   Chlamydia infection    Depression    Depression    Phreesia 06/08/2020   Depression    Phreesia 07/20/2020   Fractures    Gestational diabetes    metformin   H/O candidiasis    H/O varicella    Headache(784.0)    migraines as a child    Heart murmur    High risk HPV infection    Melanoma of face ([place]) 01/29/2017   2006  Formatting of this note might be different from the original. 2006   Moderate cocaine use disorder ([place]) 12/15/2020   Obesity (BMI 30-39.9) 01/27/2019   Papanicolaou smear of cervix with positive high risk human papilloma virus (HPV) test 06/03/2017   Pregnancy induced hypertension    1st & 2nd pregnancy   Pregnancy induced hypertension    1st & 2nd pregnancy    Two vessel umbilical cord, antepartum 11/26/2011   Vaginal Pap smear, abnormal    Vitamin D deficiency disease 01/27/2019   Yeast infection     Past Surgical History:  Procedure Laterality Date   APPENDECTOMY  08/14/2017   COLPOSCOPY     LAPAROSCOPIC APPENDECTOMY N/A  08/14/2017   Procedure: APPENDECTOMY LAPAROSCOPIC;  Surgeon: Erroll Luna, MD;  Location: [facility];  Service: General;  Laterality: N/A;   LEEP     TUBAL LIGATION N/A 01/22/2018   Procedure: POST PARTUM TUBAL LIGATION;  Surgeon: Gwynne Edinger, MD;  Location: [facility];  Service: Gynecology;  Laterality: N/A;   WISDOM TOOTH EXTRACTION      Family History  Problem Relation Age of Onset   Alcohol abuse Father    Emphysema Father    COPD Father    Mental illness Father        PTSD   Post-traumatic stress disorder Father    Alcohol abuse Brother    Stroke Brother    Cancer Paternal Grandmother        skin cancer   Multiple sclerosis Sister    Depression Mother    Bipolar disorder Mother    Anxiety disorder Mother    Asthma Son    Heart disease Maternal Grandfather     Social History   Socioeconomic History   Marital status: Significant Other    Spouse name: todd   Number  of children: 6   Years of education: 13   Highest education level: Not on file  Occupational History   Occupation: unemployed    Comment: caregiver  Tobacco Use   Smoking status: Every Day    Packs/day: 1.00    Types: Cigarettes   Smokeless tobacco: Never  Vaping Use   Vaping Use: Never used  Substance and Sexual Activity   Alcohol use: Yes    Comment: occ   Drug use: Not Currently    Types: Cocaine   Sexual activity: Yes    Birth control/protection: Surgical    Comment: tubal  Other Topics Concern   Not on file  Social History Narrative   Divorced.Lives in home with 6 children and boyfriend of 4 years.   Unemployed.   Social Determinants of Health   Financial Resource Strain: Medium Risk (08/22/2021)   Overall Financial Resource Strain (CARDIA)    Difficulty of Paying Living Expenses: Somewhat hard  Food Insecurity: Food Insecurity Present (08/22/2021)   Hunger Vital Sign    Worried About Running Out of Food in the Last Year: Sometimes true    Ran Out of Food in the Last Year:  Often true  Transportation Needs: Unmet Transportation Needs (08/22/2021)   PRAPARE - Hydrologist (Medical): Yes    Lack of Transportation (Non-Medical): No  Physical Activity: Inactive (08/22/2021)   Exercise Vital Sign    Days of Exercise per Week: 0 days    Minutes of Exercise per Session: 0 min  Stress: Stress Concern Present (08/22/2021)   [place]    Feeling of Stress : Rather much  Social Connections: Socially Isolated (08/22/2021)   Social Connection and Isolation Panel [NHANES]    Frequency of Communication with Friends and Family: Twice a week    Frequency of Social Gatherings with Friends and Family: More than three times a week    Attends Religious Services: Never    Marine scientist or Organizations: No    Attends Archivist Meetings: Never    Marital Status: Divorced  Human resources officer Violence: Not At Risk (08/22/2021)   Humiliation, Afraid, Rape, and Kick questionnaire    Fear of Current or Ex-Partner: No    Emotionally Abused: No    Physically Abused: No    Sexually Abused: No    Outpatient Medications Prior to Visit  Medication Sig Dispense Refill   chlorproMAZINE (THORAZINE) 200 MG tablet Take 1 tablet (200 mg total) by mouth at bedtime. 30 tablet 0   chlorproMAZINE (THORAZINE) 25 MG tablet Take 1 tablet (25 mg total) by mouth 3 (three) times daily as needed (anxiety). 30 tablet 0   chlorproMAZINE (THORAZINE) 50 MG tablet Take 1 tablet (50 mg total) by mouth 2 (two) times daily. At 8 AM and 3 PM 60 tablet 0   mirtazapine (REMERON) 15 MG tablet Take 1 tablet (15 mg total) by mouth at bedtime. 30 tablet 0   prazosin (MINIPRESS) 2 MG capsule Take 1 capsule (2 mg total) by mouth at bedtime. 30 capsule 0   topiramate (TOPAMAX) 25 MG tablet Take 1 tablet (25 mg total) by mouth 2 (two) times daily. 60 tablet 0   ibuprofen (ADVIL) 800 MG tablet Take 1 tablet (800  mg total) by mouth every 8 (eight) hours as needed. 30 tablet 0   No facility-administered medications prior to visit.    Allergies  Allergen Reactions   Valproic Acid  Rash    Review of Systems  Constitutional:  Positive for fatigue. Negative for chills and fever.  HENT:  Negative for congestion, sinus pressure, sinus pain and sore throat.   Eyes:  Negative for pain and discharge.  Respiratory:  Negative for cough and shortness of breath.   Cardiovascular:  Negative for chest pain and palpitations.  Gastrointestinal:  Negative for abdominal pain, diarrhea, nausea and vomiting.  Endocrine: Negative for polydipsia and polyuria.  Genitourinary:  Negative for dysuria and hematuria.  Musculoskeletal:  Negative for neck pain and neck stiffness.       Left foot pain and swelling  Skin:  Negative for rash.  Neurological:  Negative for dizziness and weakness.  Psychiatric/Behavioral:  Positive for dysphoric mood and sleep disturbance. Negative for agitation and behavioral problems. The patient is nervous/anxious.        Objective:    Physical Exam Vitals reviewed.  Constitutional:      General: She is not in acute distress.    Appearance: She is not diaphoretic.  Eyes:     General: No scleral icterus.    Extraocular Movements: Extraocular movements intact.  Cardiovascular:     Rate and Rhythm: Regular rhythm. Tachycardia present.     Pulses: Normal pulses.     Heart sounds: Normal heart sounds. No murmur heard. Pulmonary:     Breath sounds: Normal breath sounds. No wheezing or rales.  Musculoskeletal:     Right lower leg: No edema.     Comments: Left second toe swelling Tenderness over first 3 toes of left foot  Skin:    General: Skin is warm.     Findings: No rash.  Neurological:     General: No focal deficit present.     Mental Status: She is alert and oriented to person, place, and time.  Psychiatric:        Mood and Affect: Mood normal.        Behavior: Behavior  normal.     BP 124/84 (BP Location: Right Arm, Patient Position: Sitting, Cuff Size: Normal)   Pulse (!) 117   Resp 16   Ht '5\' 4"'$  (1.626 m)   Wt 213 lb (96.6 kg)   LMP  (Within Months)   SpO2 97%   BMI 36.56 kg/m  Wt Readings from Last 3 Encounters:  12/07/21 213 lb (96.6 kg)  09/15/21 221 lb 3.2 oz (100.3 kg)  08/22/21 213 lb (96.6 kg)        Assessment & Plan:   Problem List Items Addressed This Visit       Other   Contusion of second toe of left foot - Primary    Persistent left foot pain and swelling after minor injury X-ray of the foot was negative for any acute fracture or dislocation in ER, but was given ibuprofen, refilled today Referred to podiatry Advised to perform RICE measures - rest, ice, compression, elevation Work note provided       Relevant Medications   ibuprofen (ADVIL) 800 MG tablet   Other Relevant Orders   Ambulatory referral to Podiatry     Meds ordered this encounter  Medications   ibuprofen (ADVIL) 800 MG tablet    Sig: Take 1 tablet (800 mg total) by mouth every 8 (eight) hours as needed.    Dispense:  30 tablet    Refill:  0     Cleon Thoma Keith Rake, MD

## 2021-12-07 NOTE — Patient Instructions (Signed)
Please take Ibuprofen as needed for pain and swelling.  You are being referred to Podiatry.

## 2021-12-14 ENCOUNTER — Ambulatory Visit: Payer: Self-pay | Admitting: Podiatry

## 2022-01-16 ENCOUNTER — Ambulatory Visit: Payer: Medicaid Other | Admitting: Internal Medicine

## 2022-04-03 ENCOUNTER — Ambulatory Visit: Payer: Medicaid Other | Admitting: Internal Medicine

## 2022-06-06 ENCOUNTER — Telehealth: Payer: Self-pay | Admitting: Orthopaedic Surgery

## 2022-06-06 NOTE — Telephone Encounter (Signed)
Returned the patient's call, she stated she has a fractured finger.  She went to [facility], she has the notes and CD and will bring them by and then we'll get her scheduled.

## 2022-06-10 ENCOUNTER — Ambulatory Visit
Admission: RE | Admit: 2022-06-10 | Discharge: 2022-06-10 | Disposition: A | Discharge status: Home / Self Care | Payer: Self-pay | Source: Ambulatory Visit | Attending: Family Medicine | Admitting: Family Medicine

## 2022-06-10 ENCOUNTER — Other Ambulatory Visit: Payer: Self-pay

## 2022-06-10 ENCOUNTER — Ambulatory Visit (INDEPENDENT_AMBULATORY_CARE_PROVIDER_SITE_OTHER): Payer: Self-pay

## 2022-06-10 VITALS — BP 124/86 | HR 82 | Temp 97.8°F | Resp 20

## 2022-06-10 DIAGNOSIS — M79645 Pain in left finger(s): Secondary | ICD-10-CM

## 2022-06-10 DIAGNOSIS — S6992XA Unspecified injury of left wrist, hand and finger(s), initial encounter: Secondary | ICD-10-CM

## 2022-06-10 NOTE — Discharge Instructions (Signed)
Take ibuprofen and Tylenol as needed, elevate the hand at rest to help with swelling, ice off-and-on.  Wear your splint at all times.  Follow-up with orthopedics this week.

## 2022-06-10 NOTE — ED Provider Notes (Signed)
[facility]    CSN: [id number] Arrival date & time: 06/10/22  1345      History   Chief Complaint Chief Complaint  Patient presents with   Finger Injury    Entered by patient    HPI Michelle Page is a 47 y.o. female.   Patient presenting today with pain, swelling to the left ring finger after shutting the finger in the door yesterday.  Was diagnosed with a fracture to the distal left ring finger about a week ago and has been wearing a finger splint, now having pain to the other side of the finger after shutting it in the door.  Denies numbness, tingling, skin injury, loss of range of motion to the entire finger.  Not taking anything for symptoms thus far.    Past Medical History:  Diagnosis Date   Abnormal Pap smear    colpo/leep   Anemia    Anxiety    Cancer ([place]) 2006   Skin   Chlamydia infection    Depression    Depression    Phreesia 06/08/2020   Depression    Phreesia 07/20/2020   Fractures    Gestational diabetes    metformin   H/O candidiasis    H/O varicella    Headache(784.0)    migraines as a child    Heart murmur    High risk HPV infection    Melanoma of face ([place]) 01/29/2017   2006  Formatting of this note might be different from the original. 2006   Moderate cocaine use disorder ([place]) 12/15/2020   Obesity (BMI 30-39.9) 01/27/2019   Papanicolaou smear of cervix with positive high risk human papilloma virus (HPV) test 06/03/2017   Pregnancy induced hypertension    1st & 2nd pregnancy   Pregnancy induced hypertension    1st & 2nd pregnancy    Two vessel umbilical cord, antepartum 11/26/2011   Vaginal Pap smear, abnormal    Vitamin D deficiency disease 01/27/2019   Yeast infection     Patient Active Problem List   Diagnosis Date Noted   Contusion of second toe of left foot 12/07/2021   Prehypertension 09/15/2021   Tobacco abuse 09/15/2021   Irregular periods 08/22/2021   Chronic hip pain, right 02/18/2021   Moderate cocaine use  disorder ([place]) 12/15/2020   Substance abuse ([place]) 12/15/2020   Alcohol abuse 12/15/2020   MDD (major depressive disorder), recurrent, severe, 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   Anxiety disorder 08/16/2020   Annual physical exam 07/21/2020   Schizoaffective disorder ([place]) 06/09/2020   Post traumatic stress disorder 06/09/2020   Heart murmur 01/30/2019   Obesity (BMI 30-39.9) 01/27/2019   Vitamin D deficiency disease 01/27/2019   Hemorrhoids 11/26/2018   ASCUS of cervix with negative high risk HPV 01/29/2017   Bipolar I disorder, single manic episode, severe, with psychosis ([place]) 09/12/2011    Past Surgical History:  Procedure Laterality Date   APPENDECTOMY  08/14/2017   COLPOSCOPY     LAPAROSCOPIC APPENDECTOMY N/A 08/14/2017   Procedure: APPENDECTOMY LAPAROSCOPIC;  Surgeon: Erroll Luna, MD;  Location: [facility];  Service: General;  Laterality: N/A;   LEEP     TUBAL LIGATION N/A 01/22/2018   Procedure: POST PARTUM TUBAL LIGATION;  Surgeon: Gwynne Edinger, MD;  Location: [facility];  Service: Gynecology;  Laterality: N/A;   WISDOM TOOTH EXTRACTION      OB History     Gravida  8   Para  7   Term  7  Preterm  0   AB  1   Living  7      SAB      IAB  1   Ectopic      Multiple  0   Live Births  7            Home Medications    Prior to Admission medications   Medication Sig Start Date End Date Taking? Authorizing Provider  chlorproMAZINE (THORAZINE) 200 MG tablet Take 1 tablet (200 mg total) by mouth at bedtime. Patient not taking: Reported on 06/10/2022 02/28/21   Janine Limbo, MD  chlorproMAZINE (THORAZINE) 25 MG tablet Take 1 tablet (25 mg total) by mouth 3 (three) times daily as needed (anxiety). Patient not taking: Reported on 06/10/2022 02/28/21   Janine Limbo, MD  chlorproMAZINE (THORAZINE) 50 MG tablet Take 1 tablet (50 mg total) by mouth 2 (two) times daily. At 8 AM and 3 PM 02/28/21   Massengill, Ovid Curd, MD  ibuprofen (ADVIL)  800 MG tablet Take 1 tablet (800 mg total) by mouth every 8 (eight) hours as needed. Patient not taking: Reported on 06/10/2022 12/07/21   Lindell Spar, MD  mirtazapine (REMERON) 15 MG tablet Take 1 tablet (15 mg total) by mouth at bedtime. Patient not taking: Reported on 06/10/2022 02/28/21   Janine Limbo, MD  prazosin (MINIPRESS) 2 MG capsule Take 1 capsule (2 mg total) by mouth at bedtime. Patient not taking: Reported on 06/10/2022 02/28/21   Janine Limbo, MD  topiramate (TOPAMAX) 25 MG tablet Take 1 tablet (25 mg total) by mouth 2 (two) times daily. Patient not taking: Reported on 06/10/2022 02/28/21   Janine Limbo, MD    Family History Family History  Problem Relation Age of Onset   Alcohol abuse Father    Emphysema Father    COPD Father    Mental illness Father        PTSD   Post-traumatic stress disorder Father    Alcohol abuse Brother    Stroke Brother    Cancer Paternal Grandmother        skin cancer   Multiple sclerosis Sister    Depression Mother    Bipolar disorder Mother    Anxiety disorder Mother    Asthma Son    Heart disease Maternal Grandfather     Social History Social History   Tobacco Use   Smoking status: Every Day    Packs/day: 1.00    Types: Cigarettes   Smokeless tobacco: Never  Vaping Use   Vaping Use: Never used  Substance Use Topics   Alcohol use: Yes    Comment: occ   Drug use: Not Currently    Types: Cocaine     Allergies   Valproic acid   Review of Systems Review of Systems Per HPI  Physical Exam Triage Vital Signs ED Triage Vitals  Enc Vitals Group     BP 06/10/22 1427 124/86     Pulse Rate 06/10/22 1427 82     Resp 06/10/22 1427 20     Temp 06/10/22 1427 97.8 F (36.6 C)     Temp Source 06/10/22 1427 Oral     SpO2 06/10/22 1427 98 %     Weight --      Height --      Head Circumference --      Peak Flow --      Pain Score 06/10/22 1425 0     Pain Loc --      Pain Edu? --  Excl. in GC? --    No  data found.  Updated Vital Signs BP 124/86 (BP Location: Right Arm)   Pulse 82   Temp 97.8 F (36.6 C) (Oral)   Resp 20   LMP 05/27/2022   SpO2 98%   Visual Acuity Right Eye Distance:   Left Eye Distance:   Bilateral Distance:    Right Eye Near:   Left Eye Near:    Bilateral Near:     Physical Exam Vitals and nursing note reviewed.  Constitutional:      Appearance: Normal appearance. She is not ill-appearing.  HENT:     Head: Atraumatic.     Mouth/Throat:     Mouth: Mucous membranes are moist.  Eyes:     Extraocular Movements: Extraocular movements intact.     Conjunctiva/sclera: Conjunctivae normal.  Cardiovascular:     Rate and Rhythm: Normal rate and regular rhythm.     Heart sounds: Normal heart sounds.  Pulmonary:     Effort: Pulmonary effort is normal.     Breath sounds: Normal breath sounds.  Musculoskeletal:        General: Swelling, tenderness and signs of injury present. Normal range of motion.     Cervical back: Normal range of motion and neck supple.     Comments: Left ring finger tender to palpation and mildly edematous distally, slightly decreased range of motion to distal aspect of this finger  Skin:    General: Skin is warm and dry.  Neurological:     Mental Status: She is alert.     Comments: Left upper extremity neurovascularly intact  Psychiatric:        Mood and Affect: Mood normal.        Thought Content: Thought content normal.        Judgment: Judgment normal.      UC Treatments / Results  Labs (all labs ordered are listed, but only abnormal results are displayed) Labs Reviewed - No data to display  EKG   Radiology DG Finger Ring Left  Result Date: 06/10/2022 CLINICAL DATA:  Shocked door on left fourth finger today.  Pain. EXAM: LEFT RING FINGER 2+V COMPARISON:  None Available. FINDINGS: Mildly comminuted fracture of the distal tuft of the left ring finger extending to the distal shaft, nondisplaced. Mild surrounding soft tissue  swelling. No other fractures.  Joints normally aligned. IMPRESSION: 1. Nondisplaced, mildly comminuted fracture of the left ring finger distal phalanx, from the distal shaft into the tuft. Electronically Signed   By: Lajean Manes M.D.   On: 06/10/2022 14:47    Procedures Procedures (including critical care time)  Medications Ordered in UC Medications - No data to display  Initial Impression / Assessment and Plan / UC Course  I have reviewed the triage vital signs and the nursing notes.  Pertinent labs & imaging results that were available during my care of the patient were reviewed by me and considered in my medical decision making (see chart for details).     Unable to compare today's x-ray with the x-ray from last week when she was diagnosed with a distal finger fracture however only 1 fracture noted on x-ray today so suspect no new fracture.  Continue wearing splint, has an appointment with orthopedics in 3 days for follow-up on previous fracture and discussed ice, elevation, over-the-counter pain relievers.  Work note given.  Final Clinical Impressions(s) / UC Diagnoses   Final diagnoses:  Finger pain, left  Finger injury, left, initial encounter  Discharge Instructions      Take ibuprofen and Tylenol as needed, elevate the hand at rest to help with swelling, ice off-and-on.  Wear your splint at all times.  Follow-up with orthopedics this week.    ED Prescriptions   None    PDMP not reviewed this encounter.   Volney American, [place] 06/10/22 [phone]
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
## 2022-06-13 ENCOUNTER — Ambulatory Visit: Payer: Self-pay | Admitting: Orthopaedic Surgery

## 2022-06-28 ENCOUNTER — Encounter: Payer: Self-pay | Admitting: Radiology

## 2022-10-16 ENCOUNTER — Encounter (HOSPITAL_COMMUNITY): Payer: Self-pay | Admitting: *Deleted

## 2022-10-16 ENCOUNTER — Emergency Department (HOSPITAL_COMMUNITY): Payer: No Typology Code available for payment source

## 2022-10-16 ENCOUNTER — Other Ambulatory Visit: Payer: Self-pay

## 2022-10-16 ENCOUNTER — Emergency Department (HOSPITAL_COMMUNITY)
Admission: EM | Admit: 2022-10-16 | Discharge: 2022-10-16 | Disposition: A | Discharge status: Home / Self Care | Payer: No Typology Code available for payment source | Attending: Emergency Medicine | Admitting: Emergency Medicine

## 2022-10-16 DIAGNOSIS — Y92410 Unspecified street and highway as the place of occurrence of the external cause: Secondary | ICD-10-CM | POA: Diagnosis not present

## 2022-10-16 DIAGNOSIS — S322XXA Fracture of coccyx, initial encounter for closed fracture: Secondary | ICD-10-CM | POA: Diagnosis not present

## 2022-10-16 DIAGNOSIS — M542 Cervicalgia: Secondary | ICD-10-CM | POA: Diagnosis present

## 2022-10-16 DIAGNOSIS — S161XXA Strain of muscle, fascia and tendon at neck level, initial encounter: Secondary | ICD-10-CM | POA: Diagnosis not present

## 2022-10-16 LAB — POC URINE PREG, ED: Preg Test, Ur: NEGATIVE

## 2022-10-16 MED ORDER — IBUPROFEN 800 MG PO TABS: 800.0000 mg | ORAL_TABLET | Freq: Three times a day (TID) | ORAL | 0 refills | Status: DC

## 2022-10-16 MED ORDER — METHOCARBAMOL 500 MG PO TABS: 500.0000 mg | ORAL_TABLET | Freq: Three times a day (TID) | ORAL | 0 refills | Status: DC

## 2022-10-16 MED ORDER — HYDROCODONE-ACETAMINOPHEN 5-325 MG PO TABS: ORAL_TABLET | ORAL | 0 refills | Status: DC
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
## 2022-10-16 NOTE — Discharge Instructions (Signed)
I recommend alternating ice and heat to your neck.  You can also apply ice packs on and off to your lower back area.  You may purchase an over-the-counter inflatable donut ring for sitting if needed.  The x-ray of your tailbone does appear that it is broken, this will likely heal in 4 to 6 weeks.  Your pain should improve over the next few weeks.  Please follow-up with your primary care provider for recheck.
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
## 2022-10-19 ENCOUNTER — Encounter: Payer: Self-pay | Admitting: Internal Medicine

## 2022-10-19 ENCOUNTER — Ambulatory Visit (INDEPENDENT_AMBULATORY_CARE_PROVIDER_SITE_OTHER): Payer: Medicaid Other | Admitting: Internal Medicine

## 2022-10-19 VITALS — BP 120/86 | HR 90 | Ht 64.0 in | Wt 173.4 lb

## 2022-10-19 DIAGNOSIS — F333 Major depressive disorder, recurrent, severe with psychotic symptoms: Secondary | ICD-10-CM | POA: Diagnosis not present

## 2022-10-19 DIAGNOSIS — S322XXA Fracture of coccyx, initial encounter for closed fracture: Secondary | ICD-10-CM

## 2022-10-19 MED ORDER — HYDROCODONE-ACETAMINOPHEN 5-325 MG PO TABS: ORAL_TABLET | ORAL | 0 refills | Status: DC
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
## 2022-10-19 NOTE — Assessment & Plan Note (Addendum)
Previously followed at [facility].  New psychiatry referral placed at patient's request.

## 2022-10-19 NOTE — Patient Instructions (Signed)
It was a pleasure to see you today.  Thank you for giving [place] the opportunity to be involved in your care.  Below is a brief recap of your visit and next steps.   Summary Xrays of the coccyx ordered today Norco refilled Psychiatry referral placed Follow up as needed
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
## 2022-10-24 ENCOUNTER — Ambulatory Visit: Payer: Medicaid Other | Admitting: Internal Medicine

## 2022-10-29 ENCOUNTER — Ambulatory Visit (HOSPITAL_COMMUNITY)
Admission: RE | Admit: 2022-10-29 | Discharge: 2022-10-29 | Disposition: A | Discharge status: Home / Self Care | Payer: MEDICAID | Source: Ambulatory Visit | Attending: Internal Medicine | Admitting: Internal Medicine

## 2022-10-29 DIAGNOSIS — S322XXA Fracture of coccyx, initial encounter for closed fracture: Secondary | ICD-10-CM | POA: Diagnosis not present

## 2022-10-30 ENCOUNTER — Ambulatory Visit: Payer: Medicaid Other | Admitting: Orthopedic Surgery

## 2022-10-31 ENCOUNTER — Ambulatory Visit (INDEPENDENT_AMBULATORY_CARE_PROVIDER_SITE_OTHER): Payer: MEDICAID | Admitting: Orthopedic Surgery

## 2022-10-31 ENCOUNTER — Encounter: Payer: Self-pay | Admitting: Orthopedic Surgery

## 2022-10-31 VITALS — BP 135/92 | HR 89 | Ht 64.0 in | Wt 169.0 lb

## 2022-10-31 DIAGNOSIS — S322XXA Fracture of coccyx, initial encounter for closed fracture: Secondary | ICD-10-CM

## 2022-10-31 MED ORDER — CYCLOBENZAPRINE HCL 10 MG PO TABS: 10.0000 mg | ORAL_TABLET | Freq: Two times a day (BID) | ORAL | 0 refills | Status: DC | PRN

## 2022-10-31 MED ORDER — HYDROCODONE-ACETAMINOPHEN 5-325 MG PO TABS: 1.0000 | ORAL_TABLET | Freq: Three times a day (TID) | ORAL | 0 refills | Status: DC | PRN
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
## 2022-10-31 NOTE — Patient Instructions (Signed)
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  Limit lifting to nothing more than 10 pounds.  She should avoid repeated crouching until she is evaluated in clinic in 1 month.

## 2022-11-19 ENCOUNTER — Ambulatory Visit: Payer: Medicaid Other | Admitting: Internal Medicine

## 2022-11-20 ENCOUNTER — Encounter: Payer: Self-pay | Admitting: Internal Medicine

## 2022-11-26 ENCOUNTER — Encounter: Payer: Self-pay | Admitting: Internal Medicine

## 2022-11-26 ENCOUNTER — Encounter: Payer: Self-pay | Admitting: Orthopedic Surgery

## 2022-11-29 ENCOUNTER — Ambulatory Visit: Payer: MEDICAID | Admitting: Adult Health

## 2022-11-29 ENCOUNTER — Encounter: Payer: Self-pay | Admitting: Adult Health

## 2022-11-29 VITALS — BP 132/87 | HR 77 | Ht 64.0 in | Wt 173.5 lb

## 2022-11-29 DIAGNOSIS — Z1231 Encounter for screening mammogram for malignant neoplasm of breast: Secondary | ICD-10-CM | POA: Insufficient documentation

## 2022-11-29 DIAGNOSIS — Z01419 Encounter for gynecological examination (general) (routine) without abnormal findings: Secondary | ICD-10-CM | POA: Diagnosis not present

## 2022-11-29 DIAGNOSIS — F32A Depression, unspecified: Secondary | ICD-10-CM

## 2022-11-29 DIAGNOSIS — K649 Unspecified hemorrhoids: Secondary | ICD-10-CM

## 2022-11-29 DIAGNOSIS — Z1211 Encounter for screening for malignant neoplasm of colon: Secondary | ICD-10-CM | POA: Diagnosis not present

## 2022-11-29 DIAGNOSIS — F419 Anxiety disorder, unspecified: Secondary | ICD-10-CM

## 2022-11-29 DIAGNOSIS — Z1212 Encounter for screening for malignant neoplasm of rectum: Secondary | ICD-10-CM

## 2022-11-29 LAB — HEMOCCULT GUIAC POC 1CARD (OFFICE): Fecal Occult Blood, POC: NEGATIVE

## 2022-11-29 MED ORDER — HYDROCORTISONE ACETATE 25 MG RE SUPP: 25.0000 mg | Freq: Two times a day (BID) | RECTAL | 0 refills | Status: DC

## 2022-11-29 NOTE — Progress Notes (Signed)
Patient ID: Michelle Page, female   DOB: 1976-04-06, 47 y.o.   MRN: [id number] History of Present Illness: Michelle Page is a 47 year old white female, with SO, [id number] in for a well woman gyn exam. Periods irregular at times and heavy. Has ? Hemorrhoids, has pain in rectal area(has fractured coccyx). When had xray on coccyx has tubal ligation clip left pelvis.      Component Value Date/Time   DIAGPAP  08/22/2021 1443    - Negative for intraepithelial lesion or malignancy (NILM)   DIAGPAP (A) 11/26/2018 0000    ATYPICAL SQUAMOUS CELLS OF UNDETERMINED SIGNIFICANCE (ASC-US).   DIAGPAP  05/29/2017 0000    NEGATIVE FOR INTRAEPITHELIAL LESIONS OR MALIGNANCY.   DIAGPAP  05/29/2017 0000    FUNGAL ORGANISMS PRESENT CONSISTENT WITH CANDIDA SPP.   HPVHIGH Negative 08/22/2021 1443   ADEQPAP  08/22/2021 1443    Satisfactory for evaluation; transformation zone component ABSENT.   ADEQPAP (A) 11/26/2018 0000    Satisfactory for evaluation  endocervical/transformation zone component PRESENT.   ADEQPAP  05/29/2017 0000    Satisfactory for evaluation  endocervical/transformation zone component PRESENT.   PCP is Dr Allena Katz.    Current Medications, Allergies, Past Medical History, Past Surgical History, Family History and Social History were reviewed in [employer] record.     Review of Systems: Patient denies any headaches, hearing loss, fatigue, blurred vision, shortness of breath, chest pain, abdominal pain, problems with bowel movements, urination, or intercourse. No joint pain or mood swings.  See HPI for positives   Physical Exam:BP 132/87 (BP Location: Left Arm, Patient Position: Sitting, Cuff Size: Normal)   Pulse 77   Ht 5\' 4"  (1.626 m)   Wt 173 lb 8 oz (78.7 kg)   LMP 11/15/2022 (Approximate)   BMI 29.78 kg/m   General:  Well developed, well nourished, no acute distress Skin:  Warm and dry Neck:  Midline trachea, normal thyroid, good ROM, no lymphadenopathy Lungs;  Clear to auscultation bilaterally Breast:  No dominant palpable mass, retraction, or nipple discharge Cardiovascular: Regular rate and rhythm Abdomen:  Soft, non tender, no hepatosplenomegaly Pelvic:  External genitalia is normal in appearance, no lesions.  The vagina is normal in appearance. Urethra has no lesions or masses. The cervix is bulbous.  Uterus is felt to be normal size, shape, and contour.  No adnexal masses or tenderness noted.Bladder is non tender, no masses felt. Rectal: Good sphincter tone, no polyps, + hemorrhoids felt.  Hemoccult negative.(Has rectal sex at times, told to stop) Extremities/musculoskeletal:  No swelling or varicosities noted, no clubbing or cyanosis Psych:  No mood changes, alert and cooperative,seems happy AA is 5 Fall risk is low    11/29/2022   10:43 AM 10/19/2022   10:25 AM 12/07/2021    2:38 PM  Depression screen PHQ 2/9  Decreased Interest 2 3 0  Down, Depressed, Hopeless 2 3 0  PHQ - 2 Score 4 6 0  Altered sleeping 2 3 0  Tired, decreased energy 2 3 0  Change in appetite 2 3 0  Feeling bad or failure about yourself  2 3 0  Trouble concentrating 2 3 0  Moving slowly or fidgety/restless 2 2 0  Suicidal thoughts 2 1 0  PHQ-9 Score 18 24 0  Difficult doing work/chores   Not difficult at all   On meds to see BH she says     11/29/2022   10:44 AM 10/19/2022   10:25 AM 08/22/2021  2:54 PM  GAD 7 : Generalized Anxiety Score  Nervous, Anxious, on Edge 2 3 3   Control/stop worrying 2 3 2   Worry too much - different things 2 3 2   Trouble relaxing 2 3 3   Restless 2 3 3   Easily annoyed or irritable 2 3 2   Afraid - awful might happen 2 3 0  Total GAD 7 Score 14 21 15     Upstream - 11/29/22 1047       Pregnancy Intention Screening   Does the patient want to become pregnant in the next year? No    Does the patient's partner want to become pregnant in the next year? No    Would the patient like to discuss contraceptive options today? No       Contraception Wrap Up   Current Method Female Sterilization    End Method Female Sterilization    Contraception Counseling Provided No              Examination chaperoned by Malachy Mood LPN   Impression and Plan: 1. Encounter for well woman exam with routine gynecological exam Physical in 1 year Pap in 2026 Labs with PCP No problem with clip  2. Encounter for screening fecal occult blood testing Hemoccult was negative   3. Screening mammogram for breast cancer Pt to call for appt - MM 3D SCREENING MAMMOGRAM BILATERAL BREAST; Future  4. Hemorrhoids, unspecified hemorrhoid type +internal hemorrhoids  Stop rectal sex Will rx anusol supp Meds ordered this encounter  Medications   hydrocortisone (ANUSOL-HC) 25 MG suppository    Sig: Place 1 suppository (25 mg total) rectally 2 (two) times daily.    Dispense:  12 suppository    Refill:  0    Order Specific Question:   Supervising Provider    Answer:   EURE, LUTHER H [[id number]]     5. Anxiety and depression On meds and to see BH she says   6. Screening for colorectal cancer Will order cologuard  - Cologuard

## 2022-12-04 ENCOUNTER — Encounter: Payer: MEDICAID | Admitting: Orthopedic Surgery

## 2023-01-25 ENCOUNTER — Ambulatory Visit (INDEPENDENT_AMBULATORY_CARE_PROVIDER_SITE_OTHER): Payer: MEDICAID | Admitting: Internal Medicine

## 2023-01-25 ENCOUNTER — Encounter: Payer: Self-pay | Admitting: Internal Medicine

## 2023-01-25 VITALS — BP 129/88 | HR 112 | Ht 64.0 in | Wt 177.6 lb

## 2023-01-25 DIAGNOSIS — M25512 Pain in left shoulder: Secondary | ICD-10-CM

## 2023-01-25 DIAGNOSIS — F259 Schizoaffective disorder, unspecified: Secondary | ICD-10-CM | POA: Diagnosis not present

## 2023-01-25 DIAGNOSIS — H00011 Hordeolum externum right upper eyelid: Secondary | ICD-10-CM | POA: Diagnosis not present

## 2023-01-25 DIAGNOSIS — W19XXXA Unspecified fall, initial encounter: Secondary | ICD-10-CM | POA: Diagnosis not present

## 2023-01-25 MED ORDER — CELECOXIB 200 MG PO CAPS
200.0000 mg | ORAL_CAPSULE | Freq: Two times a day (BID) | ORAL | 1 refills | Status: DC | PRN
Start: 2023-01-25 — End: 2023-08-19

## 2023-01-25 NOTE — Patient Instructions (Addendum)
Please take Celebrex as needed for shoulder pain.  Please get CT head done as scheduled.  Please apply warm compresses to right upper eyelid.

## 2023-01-25 NOTE — Assessment & Plan Note (Signed)
Has improved now Advised to apply warm compresses
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
## 2023-01-25 NOTE — Assessment & Plan Note (Signed)
Used to take Olanzapine, Zoloft, trazodone and Wellbutrin Has lost follow up with psychiatrist Referred to [facility] and behavioral health clinic
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
## 2023-01-25 NOTE — Assessment & Plan Note (Signed)
Likely due to overuse injury Prescribed Celebrex as needed If persistent, may need orthopedic surgery evaluation and/or imaging

## 2023-02-13 ENCOUNTER — Ambulatory Visit (HOSPITAL_COMMUNITY): Admission: RE | Admit: 2023-02-13 | Payer: MEDICAID | Source: Ambulatory Visit
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
## 2023-07-16 ENCOUNTER — Telehealth: Payer: Self-pay

## 2023-07-16 NOTE — Telephone Encounter (Signed)
 Copied from CRM [id number]. Topic: Referral - Request for Referral >> Jul 16, 2023  9:13 AM Fuller Mandril wrote: Did the patient discuss referral with their provider in the last year? No (If No - schedule appointment) (If Yes - send message)  Appointment offered? Yes Appt schedule for 3/28 for hospital follow up, adding to [facility]   Type of order/referral and detailed reason for visit: Recent Hospital visit at [facility]- states she needs to see ENT but they need referral   Preference of office, provider, location: N/A - somewhere that accepts insurance   If referral order, have you been seen by this specialty before? No (If Yes, this issue or another issue? When? Where?  Can we respond through MyChart? No

## 2023-07-26 ENCOUNTER — Inpatient Hospital Stay: Payer: Self-pay | Admitting: Family Medicine

## 2023-08-19 ENCOUNTER — Other Ambulatory Visit: Payer: Self-pay

## 2023-08-19 ENCOUNTER — Emergency Department (HOSPITAL_COMMUNITY)
Admission: EM | Admit: 2023-08-19 | Discharge: 2023-08-20 | Disposition: A | Discharge status: Other Acute Inpatient Hospital | Payer: MEDICAID | Attending: Emergency Medicine | Admitting: Emergency Medicine

## 2023-08-19 ENCOUNTER — Encounter (HOSPITAL_COMMUNITY): Payer: Self-pay

## 2023-08-19 ENCOUNTER — Emergency Department (HOSPITAL_COMMUNITY): Payer: MEDICAID

## 2023-08-19 DIAGNOSIS — F17210 Nicotine dependence, cigarettes, uncomplicated: Secondary | ICD-10-CM | POA: Insufficient documentation

## 2023-08-19 DIAGNOSIS — F251 Schizoaffective disorder, depressive type: Secondary | ICD-10-CM | POA: Diagnosis not present

## 2023-08-19 DIAGNOSIS — F4310 Post-traumatic stress disorder, unspecified: Secondary | ICD-10-CM | POA: Diagnosis not present

## 2023-08-19 DIAGNOSIS — S0992XA Unspecified injury of nose, initial encounter: Secondary | ICD-10-CM | POA: Diagnosis present

## 2023-08-19 DIAGNOSIS — F250 Schizoaffective disorder, bipolar type: Secondary | ICD-10-CM | POA: Insufficient documentation

## 2023-08-19 DIAGNOSIS — M25551 Pain in right hip: Secondary | ICD-10-CM | POA: Diagnosis not present

## 2023-08-19 DIAGNOSIS — S022XXA Fracture of nasal bones, initial encounter for closed fracture: Secondary | ICD-10-CM | POA: Diagnosis not present

## 2023-08-19 DIAGNOSIS — R45851 Suicidal ideations: Secondary | ICD-10-CM | POA: Diagnosis not present

## 2023-08-19 DIAGNOSIS — F5105 Insomnia due to other mental disorder: Secondary | ICD-10-CM

## 2023-08-19 DIAGNOSIS — F411 Generalized anxiety disorder: Secondary | ICD-10-CM | POA: Insufficient documentation

## 2023-08-19 LAB — URINALYSIS, ROUTINE W REFLEX MICROSCOPIC
Bilirubin Urine: NEGATIVE
Glucose, UA: NEGATIVE mg/dL
Hgb urine dipstick: NEGATIVE
Ketones, ur: NEGATIVE mg/dL
Leukocytes,Ua: NEGATIVE
Nitrite: NEGATIVE
Protein, ur: NEGATIVE mg/dL
Specific Gravity, Urine: 1.005 (ref 1.005–1.030)
pH: 7 (ref 5.0–8.0)

## 2023-08-19 LAB — CBC WITH DIFFERENTIAL/PLATELET
Abs Immature Granulocytes: 0.01 10*3/uL (ref 0.00–0.07)
Basophils Absolute: 0.1 10*3/uL (ref 0.0–0.1)
Basophils Relative: 1 %
Eosinophils Absolute: 0.4 10*3/uL (ref 0.0–0.5)
Eosinophils Relative: 7 %
HCT: 40.8 % (ref 36.0–46.0)
Hemoglobin: 13.9 g/dL (ref 12.0–15.0)
Immature Granulocytes: 0 %
Lymphocytes Relative: 31 %
Lymphs Abs: 1.9 10*3/uL (ref 0.7–4.0)
MCH: 31.7 pg (ref 26.0–34.0)
MCHC: 34.1 g/dL (ref 30.0–36.0)
MCV: 92.9 fL (ref 80.0–100.0)
Monocytes Absolute: 0.4 10*3/uL (ref 0.1–1.0)
Monocytes Relative: 6 %
Neutro Abs: 3.5 10*3/uL (ref 1.7–7.7)
Neutrophils Relative %: 55 %
Platelets: 195 10*3/uL (ref 150–400)
RBC: 4.39 MIL/uL (ref 3.87–5.11)
RDW: 12.8 % (ref 11.5–15.5)
WBC: 6.3 10*3/uL (ref 4.0–10.5)
nRBC: 0 % (ref 0.0–0.2)

## 2023-08-19 LAB — WET PREP, GENITAL
Clue Cells Wet Prep HPF POC: NONE SEEN
Sperm: NONE SEEN
Trich, Wet Prep: NONE SEEN
WBC, Wet Prep HPF POC: <10 (ref <10)
Yeast Wet Prep HPF POC: NONE SEEN

## 2023-08-19 LAB — COMPREHENSIVE METABOLIC PANEL WITH GFR
ALT: 23 U/L (ref 0–44)
AST: 26 U/L (ref 15–41)
Albumin: 4.1 g/dL (ref 3.5–5.0)
Alkaline Phosphatase: 56 U/L (ref 38–126)
Anion gap: 10 (ref 5–15)
BUN: 7 mg/dL (ref 6–20)
CO2: 22 mmol/L (ref 22–32)
Calcium: 8.7 mg/dL — ABNORMAL LOW (ref 8.9–10.3)
Chloride: 108 mmol/L (ref 98–111)
Creatinine, Ser: 0.52 mg/dL (ref 0.44–1.00)
GFR, Estimated: >60 mL/min (ref >60)
Glucose, Bld: 96 mg/dL (ref 70–99)
Potassium: 4 mmol/L (ref 3.5–5.1)
Sodium: 140 mmol/L (ref 135–145)
Total Bilirubin: 0.6 mg/dL (ref 0.0–1.2)
Total Protein: 7.3 g/dL (ref 6.5–8.1)

## 2023-08-19 LAB — SALICYLATE LEVEL: Salicylate Lvl: <7 mg/dL — ABNORMAL LOW (ref 7.0–30.0)

## 2023-08-19 LAB — RAPID URINE DRUG SCREEN, HOSP PERFORMED
Amphetamines: NOT DETECTED
Barbiturates: NOT DETECTED
Benzodiazepines: NOT DETECTED
Cocaine: POSITIVE — AB
Opiates: NOT DETECTED
Tetrahydrocannabinol: NOT DETECTED

## 2023-08-19 LAB — ACETAMINOPHEN LEVEL: Acetaminophen (Tylenol), Serum: <10 ug/mL — ABNORMAL LOW (ref 10–30)

## 2023-08-19 LAB — ETHANOL: Alcohol, Ethyl (B): 107 mg/dL — ABNORMAL HIGH (ref <10)

## 2023-08-19 LAB — HIV ANTIBODY (ROUTINE TESTING W REFLEX): HIV Screen 4th Generation wRfx: NONREACTIVE

## 2023-08-19 LAB — PREGNANCY, URINE: Preg Test, Ur: NEGATIVE

## 2023-08-19 MED ORDER — ACETAMINOPHEN 325 MG PO TABS
650.0000 mg | ORAL_TABLET | Freq: Once | ORAL | Status: AC
  Administered 2023-08-19: 650 mg via ORAL
  Filled 2023-08-19: qty 2

## 2023-08-19 MED ORDER — QUETIAPINE FUMARATE 25 MG PO TABS
50.0000 mg | ORAL_TABLET | Freq: Every day | ORAL | Status: DC
  Administered 2023-08-19: 50 mg via ORAL
  Filled 2023-08-19: qty 2

## 2023-08-19 MED ORDER — IBUPROFEN 800 MG PO TABS
800.0000 mg | ORAL_TABLET | Freq: Once | ORAL | Status: AC
  Administered 2023-08-19: 800 mg via ORAL
  Filled 2023-08-19: qty 1

## 2023-08-19 MED ORDER — PENICILLIN V POTASSIUM 250 MG PO TABS
500.0000 mg | ORAL_TABLET | Freq: Three times a day (TID) | ORAL | Status: DC
Start: 2023-08-19 — End: 2023-08-26
  Administered 2023-08-19: 500 mg via ORAL
  Filled 2023-08-19: qty 2

## 2023-08-19 MED ORDER — TRIHEXYPHENIDYL HCL 2 MG PO TABS
1.0000 mg | ORAL_TABLET | Freq: Two times a day (BID) | ORAL | Status: DC
  Filled 2023-08-19: qty 1

## 2023-08-19 MED ORDER — BUPROPION HCL ER (XL) 150 MG PO TB24
150.0000 mg | ORAL_TABLET | Freq: Every day | ORAL | Status: DC
Start: 2023-08-19 — End: 2023-08-20
  Administered 2023-08-19: 150 mg via ORAL
  Filled 2023-08-19: qty 1

## 2023-08-19 NOTE — ED Notes (Addendum)
 Patient Declined to answer Suicide question at this time.

## 2023-08-19 NOTE — ED Notes (Signed)
 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

## 2023-08-19 NOTE — ED Notes (Signed)
 ED Provider at bedside.

## 2023-08-19 NOTE — ED Notes (Signed)
 Patient has struggled with a tooth abscess on her upper anterior left part of her mouth that may be aggravating the swelling in her face.
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
## 2023-08-19 NOTE — ED Notes (Signed)
 Pt transported to CT ?
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
## 2023-08-19 NOTE — ED Notes (Signed)
 Kia Dillard called this RN stating pt could arrive to [facility] after 7am on 08/20/2023  Report can be called  [phone] or [phone]   Dr. Kieran Pellet is the accepting provider

## 2023-08-19 NOTE — Progress Notes (Addendum)
 LCSW Progress Note:   MRN: [id number]  Michelle Page  08/19/2023 8:43 pm  Per Rosalynn Come, NP, patient is recommended for inpatient treatment. CSW to seek placement. @ 7:10 pm, patient was referred to [facility], requested the [facility] Cheree Cords, RN) to review patient for consideration of admission to [facility]. Will continue to monitor for updates.   @ 8:55 PM, received an update from [facility] that there are no available beds at this time. [facility] advised faxing the patient's information to alternative facilities for consideration of bed placement.Patient was faxed to the the following facilities:  Destination  Service Provider   Address Phone Fax   [facility]   [address], [place] [address] [phone]   [facility] [address]   [place] [address] [phone]   [facility]   [address], [place] [address] [phone]   [facility]   [address] [place], [place] [address] [phone]   [facility]   [address] [place] [address] [phone]   [facility]   [address] [place] [address] [phone]   [facility]   [address] [place] [address] [phone]   [facility]   [address], [place] [address] [phone]   [facility]   [address] [place] [address] [phone]   [facility]   [address], [place] [address] [phone]   [facility]   [place] [phone]   [facility]   [address], [place] [address] [phone]   [facility]   [address], [place] [address] [phone]   [facility]   [address] [place] [address] [phone]   [facility]   [address] [place] [phone]   [facility]   [address] [place] [address] [phone]   [facility]   [address] [place] [address] [phone]   [facility]   [address] [place] [address] [phone]   [facility]   [address], [place] [address] [phone]   [facility]   [address] [place], [place] [address] [phone]   [facility]   [address], [place] [address] [phone]   [facility] [place]   [place] [phone]   [facility]   [address] [place] [address] [phone]   [facility]   [address] [place], [place] [address] [phone]   [facility]   [address], [place] [address] [phone]   [facility]   [address], [place] [address] [phone]

## 2023-08-19 NOTE — ED Notes (Signed)
 Patient given [employer] , per ok of Tammy Triplett PA-C

## 2023-08-19 NOTE — Progress Notes (Signed)
 LCSW Progress Note:    MRN: [id number]  Michelle Page  08/19/2023 8:43 pm    @ 9:34 PM, received a call from Intake Coordinator Kia confirming acceptance of the patient for admission to [facility], scheduled for 08/20/2023.  The accepting provider is Percilla Boys, MD. Upon arrival, the patient will present to the [employer] unit.  Nurse report can be provided at [phone] or [phone].  Disposition updates provided to patient's care team.

## 2023-08-19 NOTE — ED Notes (Signed)
 Patient has been changed into scrubs, belongings locked in an identified locker, wanded by security, and is resting comfortably in her bed.

## 2023-08-19 NOTE — BH Assessment (Signed)
@   4:28 PM, the patient was deferred to IRIS. The IRIS coordinator, Shelvy Dickens has received the request. The patient's care team has been updated on the disposition. The IRIS coordinator will notify the care team when the IRIS provider is ready to begin the patient's assessment. If there are any questions, the care team should contact the IRIS coordinator at [phone].
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
## 2023-08-20 LAB — GC/CHLAMYDIA PROBE AMP (~~LOC~~) NOT AT ARMC
Chlamydia: NEGATIVE
Comment: NEGATIVE
Comment: NORMAL
Neisseria Gonorrhea: NEGATIVE

## 2023-08-20 LAB — RPR: RPR Ser Ql: NONREACTIVE
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
## 2023-08-20 NOTE — ED Notes (Signed)
 Safe transport called to transport patient to [facility]. Paramedic aware

## 2023-08-20 NOTE — ED Notes (Signed)
 Per Lambert Pillion at [facility] pt can not arrive until after 11am because the do not have an open bed for pt.

## 2023-09-13 ENCOUNTER — Other Ambulatory Visit (HOSPITAL_COMMUNITY)
Admission: RE | Admit: 2023-09-13 | Discharge: 2023-09-13 | Disposition: A | Discharge status: Home / Self Care | Payer: MEDICAID | Source: Ambulatory Visit | Attending: Obstetrics & Gynecology | Admitting: Obstetrics & Gynecology

## 2023-09-13 ENCOUNTER — Ambulatory Visit: Payer: MEDICAID | Admitting: *Deleted

## 2023-09-13 DIAGNOSIS — N898 Other specified noninflammatory disorders of vagina: Secondary | ICD-10-CM | POA: Diagnosis present

## 2023-09-13 DIAGNOSIS — N9489 Other specified conditions associated with female genital organs and menstrual cycle: Secondary | ICD-10-CM | POA: Insufficient documentation

## 2023-09-13 NOTE — Progress Notes (Signed)
   NURSE VISIT- VAGINITIS/STD  SUBJECTIVE:  Michelle Page is a 48 y.o. [id number] GYN patientfemale here for a vaginal swab for vaginitis screening, STD screen.  She reports the following symptoms: vaginal burning, discharge described as yellow, odor, and vulvar itching since 5/3. New partner on 5/3.  Denies abnormal vaginal bleeding, significant pelvic pain, fever, or UTI symptoms.  OBJECTIVE:  There were no vitals taken for this visit.  Appears well, in no apparent distress  ASSESSMENT: Vaginal swab for vaginitis screening/STD screen  PLAN: Self-collected vaginal probe for Gonorrhea, Chlamydia, Trichomonas, Bacterial Vaginosis, Yeast sent to lab Treatment: to be determined once results are received Follow-up as needed if symptoms persist/worsen, or new symptoms develop  Laverne Potter  09/13/2023 10:58 AM

## 2023-09-16 LAB — CERVICOVAGINAL ANCILLARY ONLY
Bacterial Vaginitis (gardnerella): POSITIVE — AB
Candida Glabrata: NEGATIVE
Candida Vaginitis: POSITIVE — AB
Chlamydia: NEGATIVE
Comment: NEGATIVE
Comment: NEGATIVE
Comment: NEGATIVE
Comment: NEGATIVE
Comment: NEGATIVE
Comment: NORMAL
Neisseria Gonorrhea: NEGATIVE
Trichomonas: NEGATIVE

## 2023-09-17 ENCOUNTER — Ambulatory Visit: Payer: Self-pay | Admitting: Adult Health

## 2023-09-17 MED ORDER — FLUCONAZOLE 150 MG PO TABS: ORAL_TABLET | ORAL | 1 refills | Status: DC

## 2023-09-17 MED ORDER — METRONIDAZOLE 500 MG PO TABS: 500.0000 mg | ORAL_TABLET | Freq: Two times a day (BID) | ORAL | 0 refills | Status: DC

## 2023-10-04 ENCOUNTER — Ambulatory Visit: Payer: MEDICAID

## 2023-10-30 ENCOUNTER — Encounter (HOSPITAL_COMMUNITY): Payer: Self-pay | Admitting: *Deleted

## 2023-10-30 ENCOUNTER — Other Ambulatory Visit: Payer: Self-pay

## 2023-10-30 ENCOUNTER — Emergency Department (HOSPITAL_COMMUNITY): Payer: MEDICAID

## 2023-10-30 ENCOUNTER — Emergency Department (HOSPITAL_COMMUNITY)
Admission: EM | Admit: 2023-10-30 | Discharge: 2023-10-30 | Disposition: A | Discharge status: Home / Self Care | Payer: MEDICAID | Attending: Emergency Medicine | Admitting: Emergency Medicine

## 2023-10-30 DIAGNOSIS — R42 Dizziness and giddiness: Secondary | ICD-10-CM | POA: Insufficient documentation

## 2023-10-30 DIAGNOSIS — K047 Periapical abscess without sinus: Secondary | ICD-10-CM | POA: Insufficient documentation

## 2023-10-30 LAB — BASIC METABOLIC PANEL WITH GFR
Anion gap: 10 (ref 5–15)
BUN: 17 mg/dL (ref 6–20)
CO2: 23 mmol/L (ref 22–32)
Calcium: 8.7 mg/dL — ABNORMAL LOW (ref 8.9–10.3)
Chloride: 103 mmol/L (ref 98–111)
Creatinine, Ser: 0.68 mg/dL (ref 0.44–1.00)
GFR, Estimated: >60 mL/min (ref >60)
Glucose, Bld: 91 mg/dL (ref 70–99)
Potassium: 4 mmol/L (ref 3.5–5.1)
Sodium: 136 mmol/L (ref 135–145)

## 2023-10-30 LAB — CBC WITH DIFFERENTIAL/PLATELET
Abs Immature Granulocytes: 0.01 10*3/uL (ref 0.00–0.07)
Basophils Absolute: 0 10*3/uL (ref 0.0–0.1)
Basophils Relative: 1 %
Eosinophils Absolute: 0.3 10*3/uL (ref 0.0–0.5)
Eosinophils Relative: 6 %
HCT: 39 % (ref 36.0–46.0)
Hemoglobin: 13 g/dL (ref 12.0–15.0)
Immature Granulocytes: 0 %
Lymphocytes Relative: 26 %
Lymphs Abs: 1.3 10*3/uL (ref 0.7–4.0)
MCH: 31.2 pg (ref 26.0–34.0)
MCHC: 33.3 g/dL (ref 30.0–36.0)
MCV: 93.5 fL (ref 80.0–100.0)
Monocytes Absolute: 0.4 10*3/uL (ref 0.1–1.0)
Monocytes Relative: 9 %
Neutro Abs: 2.8 10*3/uL (ref 1.7–7.7)
Neutrophils Relative %: 58 %
Platelets: 153 10*3/uL (ref 150–400)
RBC: 4.17 MIL/uL (ref 3.87–5.11)
RDW: 13.2 % (ref 11.5–15.5)
WBC: 4.9 10*3/uL (ref 4.0–10.5)
nRBC: 0 % (ref 0.0–0.2)

## 2023-10-30 LAB — URINALYSIS, ROUTINE W REFLEX MICROSCOPIC
Bacteria, UA: NONE SEEN
Bilirubin Urine: NEGATIVE
Glucose, UA: NEGATIVE mg/dL
Ketones, ur: NEGATIVE mg/dL
Leukocytes,Ua: NEGATIVE
Nitrite: NEGATIVE
Protein, ur: NEGATIVE mg/dL
Specific Gravity, Urine: 1.021 (ref 1.005–1.030)
pH: 5 (ref 5.0–8.0)

## 2023-10-30 LAB — PREGNANCY, URINE: Preg Test, Ur: NEGATIVE

## 2023-10-30 MED ORDER — AMOXICILLIN 500 MG PO CAPS: 500.0000 mg | ORAL_CAPSULE | Freq: Three times a day (TID) | ORAL | 0 refills | Status: DC

## 2023-10-30 MED ORDER — SODIUM CHLORIDE 0.9 % IV BOLUS
500.0000 mL | Freq: Once | INTRAVENOUS | Status: AC
  Administered 2023-10-30: 500 mL via INTRAVENOUS

## 2023-10-30 MED ORDER — MECLIZINE HCL 25 MG PO TABS: 25.0000 mg | ORAL_TABLET | Freq: Three times a day (TID) | ORAL | 0 refills | Status: DC | PRN

## 2023-10-30 MED ORDER — MECLIZINE HCL 12.5 MG PO TABS
25.0000 mg | ORAL_TABLET | Freq: Once | ORAL | Status: AC
  Administered 2023-10-30: 25 mg via ORAL
  Filled 2023-10-30: qty 2

## 2023-10-30 NOTE — Discharge Instructions (Addendum)
 Take the medication prescribed as needed for dizziness.  You are also being prescribed an antibiotic for your dental infection.  I do recommend following up with a dentist of your choice for definitive treatment of this problem.

## 2023-10-30 NOTE — ED Provider Notes (Signed)
 [place] EMERGENCY DEPARTMENT AT [facility] Provider Note   CSN: [id number] Arrival date & time: 10/30/23  0945     Patient presents with: Dizziness   Michelle Page is a 48 y.o. female with a history including anemia, anxiety, bipolar disorder, depression, history of cocaine use not current, and not currently on any medications presenting for evaluation of an approximate 1 year history of intermittent dizziness which she describes as episodes of room spinning with positional changes such as when she stands from a seated position or even when she just turns her head or rolls over when in bed.  Her symptoms have been fairly consistently intermittent over the past year but she also recently has been having problems with a dental infection, pointing out a pocket above her left upper lateral incisor which routinely drains a little bit of pus and is concerned the dizziness may be related to this dental infection.  She denies fevers or chills, she denies headache, fevers, neck pain or stiffness, she denies tinnitus or any ear or hearing complaints.  She also denies focal weakness.   The history is provided by the patient.       Prior to Admission medications   Medication Sig Start Date End Date Taking? Authorizing Provider  amoxicillin  (AMOXIL ) 500 MG capsule Take 1 capsule (500 mg total) by mouth 3 (three) times daily. 10/30/23  Yes Charley Lafrance, PA-C  meclizine  (ANTIVERT ) 25 MG tablet Take 1 tablet (25 mg total) by mouth 3 (three) times daily as needed for dizziness. 10/30/23  Yes Mende Biswell, Mliss, PA-C  fluconazole  (DIFLUCAN ) 150 MG tablet Take 1 now and 1 in 3 days 09/17/23   Signa Delon LABOR, NP  metroNIDAZOLE  (FLAGYL ) 500 MG tablet Take 1 tablet (500 mg total) by mouth 2 (two) times daily. 09/17/23   Signa Delon LABOR, NP    Allergies: Valproic acid    Review of Systems  Constitutional:  Negative for chills and fever.  HENT:  Positive for dental problem. Negative for  congestion and sore throat.   Eyes: Negative.   Respiratory:  Negative for chest tightness and shortness of breath.   Cardiovascular:  Negative for chest pain.  Gastrointestinal:  Negative for abdominal pain, nausea and vomiting.  Genitourinary: Negative.   Musculoskeletal:  Negative for arthralgias, joint swelling and neck pain.  Skin: Negative.  Negative for rash and wound.  Neurological:  Positive for dizziness. Negative for weakness, light-headedness, numbness and headaches.  Psychiatric/Behavioral: Negative.      Updated Vital Signs BP 118/89   Pulse 71   Temp 98 F (36.7 C) (Oral)   Resp 18   Ht 5' 4 (1.626 m)   Wt 77.1 kg   SpO2 99%   BMI 29.18 kg/m   Physical Exam Vitals and nursing note reviewed.  Constitutional:      Appearance: She is well-developed.  HENT:     Head: Normocephalic and atraumatic.     Right Ear: Tympanic membrane normal.     Left Ear: Tympanic membrane normal.     Mouth/Throat:     Dentition: Dental abscesses present.     Comments: Small circular area of erythema left upper gingiva between the left central and lateral incisor.  No drainage.  No fluctuance Eyes:     Extraocular Movements: Extraocular movements intact.     Pupils: Pupils are equal, round, and reactive to light.  Cardiovascular:     Rate and Rhythm: Normal rate.     Heart sounds: Normal  heart sounds.  Pulmonary:     Effort: Pulmonary effort is normal.  Abdominal:     Palpations: Abdomen is soft.     Tenderness: There is no abdominal tenderness.  Musculoskeletal:        General: Normal range of motion.     Cervical back: Normal range of motion and neck supple.  Lymphadenopathy:     Cervical: No cervical adenopathy.  Skin:    General: Skin is warm and dry.     Findings: No rash.  Neurological:     General: No focal deficit present.     Mental Status: She is alert and oriented to person, place, and time.     GCS: GCS eye subscore is 4. GCS verbal subscore is 5. GCS motor  subscore is 6.     Cranial Nerves: No cranial nerve deficit.     Sensory: No sensory deficit.     Coordination: Coordination normal.     Gait: Gait normal.     Deep Tendon Reflexes: Reflexes normal.     Comments: Normal heel-shin, normal rapid alternating movements. Cranial nerves III-XII intact.  No pronator drift.  Psychiatric:        Speech: Speech normal.        Behavior: Behavior normal.        Thought Content: Thought content normal.     (all labs ordered are listed, but only abnormal results are displayed) Labs Reviewed  URINALYSIS, ROUTINE W REFLEX MICROSCOPIC - Abnormal; Notable for the following components:      Result Value   Hgb urine dipstick LARGE (*)    All other components within normal limits  BASIC METABOLIC PANEL WITH GFR - Abnormal; Notable for the following components:   Calcium 8.7 (*)    All other components within normal limits  PREGNANCY, URINE  CBC WITH DIFFERENTIAL/PLATELET    EKG: None  Radiology: No results found.    Procedures   Medications Ordered in the ED  sodium chloride  0.9 % bolus 500 mL (0 mLs Intravenous Stopped 10/30/23 1206)  meclizine  (ANTIVERT ) tablet 25 mg (25 mg Oral Given 10/30/23 1053)                                    Medical Decision Making Patient presenting with a 1 year history of intermittent vertigo-like symptoms, endorsing symptoms today although her neuroexam is normal.  She was given meclizine  while here and she did note feeling improved at time of discharge.  She was also given IV fluids.  She does have what appears to be a little abscess track above her left incisors, not currently draining purulence but I suspect she does have a chronic infection at this tooth.  She is placed on amoxicillin .  She is advised close follow-up with her primary provider and her dentist.  She was also given a referral for ENT for as needed follow-up if symptoms are not improving.  Patient was also given instructions for Epley maneuvers  which she may try at home.  Amount and/or Complexity of Data Reviewed Labs: ordered.    Details: Labs reviewed and reassuring, normal be met, CBC urine pregnancy and urinalysis.  She does have a large number of hemoglobin on her urine dip which I suspect is erroneous as her microscopic reveals 0-5 RBCs. Radiology: ordered.    Details: CT head is negative for acute or chronic intracranial concerns.  Risk Prescription drug management.  Final diagnoses:  Vertigo  Dental infection    ED Discharge Orders          Ordered    meclizine  (ANTIVERT ) 25 MG tablet  3 times daily PRN        10/30/23 1216    amoxicillin  (AMOXIL ) 500 MG capsule  3 times daily        10/30/23 1222               Leonid Manus, PA-C 11/01/23 1603    Bernard Drivers, MD 11/03/23 [phone]

## 2023-10-30 NOTE — ED Triage Notes (Signed)
 Pt c/o dizziness x one year but states the dizziness is intermittent and she has it sometimes when she stands up, turns her head or rolls over in bed  Pt states she has a toothache to left upper mouth

## 2023-12-20 ENCOUNTER — Encounter: Payer: Self-pay | Admitting: Radiology

## 2024-03-02 ENCOUNTER — Encounter: Payer: Self-pay | Admitting: Radiology

## 2024-05-07 ENCOUNTER — Encounter: Payer: Self-pay | Admitting: Emergency Medicine

## 2024-05-07 ENCOUNTER — Ambulatory Visit: Payer: MEDICAID

## 2024-05-07 ENCOUNTER — Ambulatory Visit
Admission: EM | Admit: 2024-05-07 | Discharge: 2024-05-07 | Disposition: A | Discharge status: Home / Self Care | Payer: MEDICAID | Attending: Nurse Practitioner | Admitting: Nurse Practitioner

## 2024-05-07 DIAGNOSIS — N898 Other specified noninflammatory disorders of vagina: Secondary | ICD-10-CM | POA: Diagnosis present

## 2024-05-07 MED ORDER — FLUCONAZOLE 150 MG PO TABS: 150.0000 mg | ORAL_TABLET | Freq: Once | ORAL | 0 refills | Status: AC
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
## 2024-05-07 NOTE — Discharge Instructions (Addendum)
 We are treating you today for a yeast infection.  Take the Diflucan  as prescribed.  Will contact you if anything else comes back positive from the testing today.  Recommend condom use with every sexual contact to prevent STI.

## 2024-05-08 ENCOUNTER — Ambulatory Visit (HOSPITAL_COMMUNITY): Payer: Self-pay

## 2024-05-08 LAB — CERVICOVAGINAL ANCILLARY ONLY
Bacterial Vaginitis (gardnerella): POSITIVE — AB
Candida Glabrata: NEGATIVE
Candida Vaginitis: NEGATIVE
Chlamydia: NEGATIVE
Comment: NEGATIVE
Comment: NEGATIVE
Comment: NEGATIVE
Comment: NEGATIVE
Comment: NEGATIVE
Comment: NORMAL
Neisseria Gonorrhea: NEGATIVE
Trichomonas: NEGATIVE

## 2024-05-08 LAB — SYPHILIS: RPR W/REFLEX TO RPR TITER AND TREPONEMAL ANTIBODIES, TRADITIONAL SCREENING AND DIAGNOSIS ALGORITHM: RPR Ser Ql: NONREACTIVE

## 2024-05-08 LAB — CYTOLOGY, (ORAL, ANAL, URETHRAL) ANCILLARY ONLY
Chlamydia: NEGATIVE
Comment: NEGATIVE
Comment: NORMAL
Neisseria Gonorrhea: NEGATIVE

## 2024-05-08 LAB — HIV ANTIBODY (ROUTINE TESTING W REFLEX): HIV Screen 4th Generation wRfx: NONREACTIVE

## 2024-05-08 MED ORDER — METRONIDAZOLE 500 MG PO TABS: 500.0000 mg | ORAL_TABLET | Freq: Two times a day (BID) | ORAL | 0 refills | Status: AC

## 2024-06-01 ENCOUNTER — Encounter (HOSPITAL_COMMUNITY): Payer: Self-pay | Admitting: Emergency Medicine

## 2024-06-01 ENCOUNTER — Emergency Department (HOSPITAL_COMMUNITY)

## 2024-06-01 ENCOUNTER — Other Ambulatory Visit: Payer: Self-pay

## 2024-06-01 ENCOUNTER — Emergency Department (HOSPITAL_COMMUNITY): Admission: EM | Admit: 2024-06-01 | Discharge: 2024-06-01 | Disposition: A | Discharge status: Home / Self Care

## 2024-06-01 DIAGNOSIS — S52572A Other intraarticular fracture of lower end of left radius, initial encounter for closed fracture: Secondary | ICD-10-CM | POA: Insufficient documentation

## 2024-06-01 DIAGNOSIS — S52612A Displaced fracture of left ulna styloid process, initial encounter for closed fracture: Secondary | ICD-10-CM | POA: Insufficient documentation

## 2024-06-01 DIAGNOSIS — W010XXA Fall on same level from slipping, tripping and stumbling without subsequent striking against object, initial encounter: Secondary | ICD-10-CM | POA: Insufficient documentation

## 2024-06-01 DIAGNOSIS — S52502A Unspecified fracture of the lower end of left radius, initial encounter for closed fracture: Secondary | ICD-10-CM

## 2024-06-01 MED ORDER — HYDROCODONE-ACETAMINOPHEN 5-325 MG PO TABS: ORAL_TABLET | ORAL | 0 refills | Status: DC

## 2024-06-01 NOTE — Discharge Instructions (Addendum)
 Elevate your left hand when possible.  Keep the splint in place.  Call the orthopedic provider listed to arrange follow-up appointment

## 2024-06-01 NOTE — ED Triage Notes (Signed)
 Pt slipped on ice yesterday landing on her left wrist. C/o of pain to left wrist.

## 2024-06-05 ENCOUNTER — Encounter: Payer: Self-pay | Admitting: Orthopedic Surgery

## 2024-06-05 ENCOUNTER — Ambulatory Visit: Admitting: Orthopedic Surgery

## 2024-06-05 VITALS — BP 126/89 | HR 97 | Ht 64.0 in | Wt 184.0 lb

## 2024-06-05 DIAGNOSIS — S52572A Other intraarticular fracture of lower end of left radius, initial encounter for closed fracture: Secondary | ICD-10-CM

## 2024-06-05 MED ORDER — HYDROCODONE-ACETAMINOPHEN 5-325 MG PO TABS: 1.0000 | ORAL_TABLET | Freq: Four times a day (QID) | ORAL | 0 refills | Status: AC | PRN

## 2024-06-05 NOTE — Progress Notes (Signed)
 New Patient Visit  Summary: Michelle Page is a 49 y.o. female with the following: Left distal radius fracture; minimally displaced with intra-articular split  Assessment and Plan Assessment & Plan Minimally displaced distal radius fracture of the left wrist Acute minimally displaced distal radius fracture with intra-articular extension, minimal displacement, and stable alignment on current radiographs. Non-operative management is appropriate at this time, but surgical fixation remains an option if displacement occurs. Anticipated healing time is 4-6 weeks in a cast, with possible transition to a brace if pain is controlled and healing is adequate. Return to work may require 8-12 weeks depending on recovery of strength and range of motion. - Applied short arm cast to left forearm for immobilization. - Scheduled follow-up in 10 days for clinical evaluation and repeat radiographs to assess for fracture displacement and healing. - Discussed possibility of recasting at follow-up due to expected reduction in swelling and potential for cast loosening or irritation. - Reviewed casting duration of 4-6 weeks, with potential transition to brace after one month if pain and healing permit. - Discussed anticipated return to work in 8-12 weeks depending on recovery of strength and range of motion. - Prescribed additional pain medication as needed. - Advised acetaminophen  and/or ibuprofen  for pain control. - Instructed to elevate hand to reduce edema and throbbing. - Discussed cast color options.   Cast application - Left short arm cast   Verbal consent was obtained and the correct extremity was identified. A well padded, appropriately molded short arm cast was applied to the Left arm Fingers remained warm and well perfused.   There were no sharp edges Patient tolerated the procedure well Cast care instructions were provided     Follow-up: No follow-ups on file.  Subjective:  Chief  Complaint  Patient presents with   Wrist Injury    L after a fall on the ice DOI 06/01/24     Discussed the use of AI scribe software for clinical note transcription with the patient, who gave verbal consent to proceed.  History of Present Illness Michelle Page is a 49 year old female who presents for evaluation of a displaced distal left radius fracture following a fall.  She fell on ice onto her outstretched left wrist while walking to her car. 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  She had immediate severe left wrist pain, throbbing with intermittent shock-like pain, worse with movement or lowering the hand. Pain improves with rest and elevation but remains significant.  She was evaluated in the emergency department, diagnosed with a wrist fracture, and told she would need a cast. She completed a short course of prescribed pain medication that caused somnolence. She has not tried acetaminophen  or NSAIDs.  She denies other medical problems relevant to the injury. She takes unspecified psychiatric medications.    Review of Systems: No fevers or chills No numbness or tingling No chest pain No shortness of breath No bowel or bladder dysfunction No GI distress No headaches   Medical History:  Past Medical History:  Diagnosis Date   Abnormal Pap smear    colpo/leep   Anemia    Anxiety    Cancer (HCC) 2006   Skin   Chlamydia infection    Depression    Depression    Phreesia 06/08/2020   Depression    [employer]reesia 07/20/2020   Fractures    Gestational diabetes    metformin    H/O candidiasis  H/O varicella    Headache(784.0)    migraines as a child    Heart murmur    High risk HPV infection    Melanoma of face (HCC) 01/29/2017   2006  Formatting of this note might be different from the original. 2006   Moderate cocaine use disorder (HCC) 12/15/2020   Obesity (BMI 30-39.9) 01/27/2019   Papanicolaou  smear of cervix with positive high risk human papilloma virus (HPV) test 06/03/2017   Pregnancy induced hypertension    1st & 2nd pregnancy   Pregnancy induced hypertension    1st & 2nd pregnancy    Two vessel umbilical cord, antepartum 11/26/2011   Vaginal Pap smear, abnormal    Vitamin D deficiency disease 01/27/2019   Yeast infection     Past Surgical History:  Procedure Laterality Date   APPENDECTOMY  08/14/2017   COLPOSCOPY     LAPAROSCOPIC APPENDECTOMY N/A 08/14/2017   Procedure: APPENDECTOMY LAPAROSCOPIC;  Surgeon: Vanderbilt Ned, MD;  Location: [facility] OR;  Service: General;  Laterality: N/A;   LEEP     TUBAL LIGATION N/A 01/22/2018   Procedure: POST PARTUM TUBAL LIGATION;  Surgeon: Kandis Devaughn Sayres, MD;  Location: [facility];  Service: Gynecology;  Laterality: N/A;   WISDOM TOOTH EXTRACTION      Family History  Problem Relation Age of Onset   Alcohol abuse Father    Emphysema Father    COPD Father    Mental illness Father        PTSD   Post-traumatic stress disorder Father    Alcohol abuse Brother    Stroke Brother    Cancer Paternal Grandmother        skin cancer   Multiple sclerosis Sister    Depression Mother    Bipolar disorder Mother    Anxiety disorder Mother    Asthma Son    Heart disease Maternal Grandfather    Social History[1]  Allergies[2]  Active Medications[3]  Objective: BP 126/89   Pulse 97   Ht 5' 4 (1.626 m)   Wt 184 lb (83.5 kg)   LMP 05/07/2024 (Exact Date)   BMI 31.58 kg/m   Physical Exam:    General: Alert and oriented. and No acute distress. Gait: Normal gait.  Physical Exam MUSCULOSKELETAL: Evaluation of left wrist demonstrates diffuse swelling.  Pain with minimal motion.  Tenderness to palpation along the distal radius.  There is some bruising.  Fingers are warm and well-perfused.  Sensation intact throughout the left hand.   IMAGING: I personally reviewed images previously obtained from the ED   X-rays from the  emergency department are available in clinic today.  Distal radius fracture, with impaction, and intra-articular split.  No obvious step-off within the joint.     New Medications:  No orders of the defined types were placed in this encounter.     Portions of this note were completed via Scientist, clinical (histocompatibility and immunogenetics).  Oneil DELENA Horde, MD  06/05/2024 10:14 AM      [1]  Social History Tobacco Use   Smoking status: Every Day    Current packs/day: 1.00    Types: Cigarettes   Smokeless tobacco: Never  Vaping Use   Vaping status: Never Used  Substance Use Topics   Alcohol use: Yes    Comment: occ   Drug use: Not Currently    Types: Cocaine  [2]  Allergies Allergen Reactions   Valproic Acid Rash  [3]  No outpatient medications have been marked as taking for  the 06/05/24 encounter (Office Visit) with Onesimo Oneil LABOR, MD.

## 2024-06-05 NOTE — Patient Instructions (Signed)

## 2024-06-19 ENCOUNTER — Encounter: Admitting: Orthopedic Surgery
